# Patient Record
Sex: Female | Born: 1950 | Race: White | Hispanic: No | Smoking: Former smoker
Health system: Southern US, Community
[De-identification: ages and names within clinical notes are randomized; demographics above are authoritative.]

## PROBLEM LIST (undated history)

## (undated) DIAGNOSIS — K7460 Unspecified cirrhosis of liver: Secondary | ICD-10-CM

## (undated) DIAGNOSIS — K219 Gastro-esophageal reflux disease without esophagitis: Secondary | ICD-10-CM

## (undated) DIAGNOSIS — K7581 Nonalcoholic steatohepatitis (NASH): Secondary | ICD-10-CM

## (undated) DIAGNOSIS — R911 Solitary pulmonary nodule: Secondary | ICD-10-CM

## (undated) DIAGNOSIS — I10 Essential (primary) hypertension: Secondary | ICD-10-CM

## (undated) DIAGNOSIS — C50512 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 female breast: Secondary | ICD-10-CM

## (undated) DIAGNOSIS — C50919 Malignant neoplasm of unspecified site of unspecified female breast: Secondary | ICD-10-CM

## (undated) DIAGNOSIS — Z170 Estrogen receptor positive status [ER+]: Secondary | ICD-10-CM

## (undated) DIAGNOSIS — J479 Bronchiectasis, uncomplicated: Secondary | ICD-10-CM

## (undated) DIAGNOSIS — R011 Cardiac murmur, unspecified: Secondary | ICD-10-CM

## (undated) DIAGNOSIS — E119 Type 2 diabetes mellitus without complications: Secondary | ICD-10-CM

## (undated) HISTORY — PX: ABDOMINAL HYSTERECTOMY: SHX81

## (undated) HISTORY — PX: TONSILLECTOMY: SUR1361

## (undated) HISTORY — PX: CHOLECYSTECTOMY: SHX55

## (undated) HISTORY — PX: SPLENECTOMY: SUR1306

## (undated) HISTORY — PX: CATARACT EXTRACTION: SUR2

---

## 2005-04-03 ENCOUNTER — Ambulatory Visit: Payer: Self-pay | Admitting: Family Medicine

## 2006-01-20 ENCOUNTER — Ambulatory Visit: Payer: Self-pay | Admitting: Family Medicine

## 2006-04-13 ENCOUNTER — Ambulatory Visit: Payer: Self-pay | Admitting: Family Medicine

## 2007-04-22 ENCOUNTER — Ambulatory Visit: Payer: Self-pay | Admitting: Family Medicine

## 2007-05-21 ENCOUNTER — Ambulatory Visit: Payer: Self-pay | Admitting: Gastroenterology

## 2008-03-07 ENCOUNTER — Ambulatory Visit: Payer: Self-pay | Admitting: Gastroenterology

## 2008-04-24 ENCOUNTER — Ambulatory Visit: Payer: Self-pay | Admitting: Family Medicine

## 2008-07-14 ENCOUNTER — Ambulatory Visit: Payer: Self-pay | Admitting: Internal Medicine

## 2009-05-03 ENCOUNTER — Ambulatory Visit: Payer: Self-pay | Admitting: Family Medicine

## 2009-07-03 ENCOUNTER — Ambulatory Visit: Payer: Self-pay | Admitting: Nurse Practitioner

## 2009-07-05 ENCOUNTER — Ambulatory Visit: Payer: Self-pay | Admitting: Family Medicine

## 2010-05-07 ENCOUNTER — Ambulatory Visit: Payer: Self-pay | Admitting: Family Medicine

## 2010-07-09 DIAGNOSIS — N949 Unspecified condition associated with female genital organs and menstrual cycle: Secondary | ICD-10-CM | POA: Insufficient documentation

## 2010-07-09 HISTORY — DX: Unspecified condition associated with female genital organs and menstrual cycle: N94.9

## 2011-03-19 ENCOUNTER — Ambulatory Visit: Payer: Self-pay | Admitting: Family Medicine

## 2011-09-09 ENCOUNTER — Ambulatory Visit: Payer: Self-pay | Admitting: Family Medicine

## 2012-03-10 HISTORY — PX: CARDIAC CATHETERIZATION: SHX172

## 2012-09-29 DIAGNOSIS — K760 Fatty (change of) liver, not elsewhere classified: Secondary | ICD-10-CM | POA: Insufficient documentation

## 2012-09-29 HISTORY — DX: Fatty (change of) liver, not elsewhere classified: K76.0

## 2012-10-06 ENCOUNTER — Ambulatory Visit: Payer: Self-pay | Admitting: Nurse Practitioner

## 2013-06-08 ENCOUNTER — Encounter (HOSPITAL_COMMUNITY): Payer: Self-pay | Admitting: Emergency Medicine

## 2013-06-08 ENCOUNTER — Emergency Department (HOSPITAL_COMMUNITY): Payer: BC Managed Care – PPO

## 2013-06-08 ENCOUNTER — Observation Stay (HOSPITAL_COMMUNITY)
Admission: EM | Admit: 2013-06-08 | Discharge: 2013-06-10 | Disposition: A | Discharge status: Home / Self Care | Payer: BC Managed Care – PPO | Attending: Family Medicine | Admitting: Family Medicine

## 2013-06-08 DIAGNOSIS — J479 Bronchiectasis, uncomplicated: Secondary | ICD-10-CM | POA: Diagnosis present

## 2013-06-08 DIAGNOSIS — I359 Nonrheumatic aortic valve disorder, unspecified: Secondary | ICD-10-CM | POA: Insufficient documentation

## 2013-06-08 DIAGNOSIS — K7689 Other specified diseases of liver: Secondary | ICD-10-CM | POA: Insufficient documentation

## 2013-06-08 DIAGNOSIS — R946 Abnormal results of thyroid function studies: Secondary | ICD-10-CM | POA: Insufficient documentation

## 2013-06-08 DIAGNOSIS — J209 Acute bronchitis, unspecified: Secondary | ICD-10-CM

## 2013-06-08 DIAGNOSIS — I10 Essential (primary) hypertension: Secondary | ICD-10-CM | POA: Diagnosis present

## 2013-06-08 DIAGNOSIS — J40 Bronchitis, not specified as acute or chronic: Secondary | ICD-10-CM | POA: Diagnosis present

## 2013-06-08 DIAGNOSIS — E119 Type 2 diabetes mellitus without complications: Secondary | ICD-10-CM | POA: Insufficient documentation

## 2013-06-08 DIAGNOSIS — R0989 Other specified symptoms and signs involving the circulatory and respiratory systems: Principal | ICD-10-CM | POA: Insufficient documentation

## 2013-06-08 DIAGNOSIS — Z87891 Personal history of nicotine dependence: Secondary | ICD-10-CM | POA: Insufficient documentation

## 2013-06-08 DIAGNOSIS — Z7982 Long term (current) use of aspirin: Secondary | ICD-10-CM | POA: Insufficient documentation

## 2013-06-08 DIAGNOSIS — E860 Dehydration: Secondary | ICD-10-CM | POA: Insufficient documentation

## 2013-06-08 DIAGNOSIS — I2584 Coronary atherosclerosis due to calcified coronary lesion: Secondary | ICD-10-CM | POA: Insufficient documentation

## 2013-06-08 DIAGNOSIS — R000 Tachycardia, unspecified: Secondary | ICD-10-CM | POA: Insufficient documentation

## 2013-06-08 DIAGNOSIS — Z794 Long term (current) use of insulin: Secondary | ICD-10-CM | POA: Insufficient documentation

## 2013-06-08 DIAGNOSIS — R0600 Dyspnea, unspecified: Secondary | ICD-10-CM | POA: Diagnosis present

## 2013-06-08 DIAGNOSIS — E1140 Type 2 diabetes mellitus with diabetic neuropathy, unspecified: Secondary | ICD-10-CM | POA: Diagnosis present

## 2013-06-08 DIAGNOSIS — R0609 Other forms of dyspnea: Principal | ICD-10-CM | POA: Insufficient documentation

## 2013-06-08 HISTORY — DX: Type 2 diabetes mellitus without complications: E11.9

## 2013-06-08 HISTORY — DX: Unspecified cirrhosis of liver: K74.60

## 2013-06-08 HISTORY — DX: Solitary pulmonary nodule: R91.1

## 2013-06-08 HISTORY — DX: Bronchiectasis, uncomplicated: J47.9

## 2013-06-08 HISTORY — DX: Essential (primary) hypertension: I10

## 2013-06-08 LAB — COMPREHENSIVE METABOLIC PANEL
ALK PHOS: 150 U/L — AB (ref 39–117)
ALT: 60 U/L — ABNORMAL HIGH (ref 0–35)
AST: 53 U/L — ABNORMAL HIGH (ref 0–37)
Albumin: 4 g/dL (ref 3.5–5.2)
BUN: 13 mg/dL (ref 6–23)
CO2: 26 mEq/L (ref 19–32)
CREATININE: 0.7 mg/dL (ref 0.50–1.10)
Calcium: 9.8 mg/dL (ref 8.4–10.5)
Chloride: 102 mEq/L (ref 96–112)
GFR calc Af Amer: >90 mL/min (ref >90)
GFR calc non Af Amer: >90 mL/min (ref >90)
Glucose, Bld: 149 mg/dL — ABNORMAL HIGH (ref 70–99)
Potassium: 4 mEq/L (ref 3.7–5.3)
Sodium: 143 mEq/L (ref 137–147)
TOTAL PROTEIN: 8.4 g/dL — AB (ref 6.0–8.3)
Total Bilirubin: 0.6 mg/dL (ref 0.3–1.2)

## 2013-06-08 LAB — CBC WITH DIFFERENTIAL/PLATELET
Basophils Absolute: 0 10*3/uL (ref 0.0–0.1)
Basophils Relative: 0 % (ref 0–1)
EOS PCT: 3 % (ref 0–5)
Eosinophils Absolute: 0.3 10*3/uL (ref 0.0–0.7)
HCT: 38.5 % (ref 36.0–46.0)
HEMOGLOBIN: 12.7 g/dL (ref 12.0–15.0)
Lymphocytes Relative: 25 % (ref 12–46)
Lymphs Abs: 2.5 10*3/uL (ref 0.7–4.0)
MCH: 29.3 pg (ref 26.0–34.0)
MCHC: 33 g/dL (ref 30.0–36.0)
MCV: 88.7 fL (ref 78.0–100.0)
MONO ABS: 0.9 10*3/uL (ref 0.1–1.0)
MONOS PCT: 9 % (ref 3–12)
Neutro Abs: 6.2 10*3/uL (ref 1.7–7.7)
Neutrophils Relative %: 63 % (ref 43–77)
Platelets: 205 10*3/uL (ref 150–400)
RBC: 4.34 MIL/uL (ref 3.87–5.11)
RDW: 13.2 % (ref 11.5–15.5)
WBC: 10 10*3/uL (ref 4.0–10.5)

## 2013-06-08 LAB — PRO B NATRIURETIC PEPTIDE: PRO B NATRI PEPTIDE: 175.8 pg/mL — AB (ref 0–125)

## 2013-06-08 LAB — TROPONIN I: Troponin I: <0.3 ng/mL (ref <0.30)

## 2013-06-08 MED ORDER — IPRATROPIUM BROMIDE 0.02 % IN SOLN
0.5000 mg | Freq: Once | RESPIRATORY_TRACT | Status: AC
  Administered 2013-06-08: 0.5 mg via RESPIRATORY_TRACT
  Filled 2013-06-08: qty 2.5

## 2013-06-08 MED ORDER — METHYLPREDNISOLONE SODIUM SUCC 125 MG IJ SOLR
125.0000 mg | Freq: Once | INTRAMUSCULAR | Status: AC
  Administered 2013-06-08: 125 mg via INTRAVENOUS
  Filled 2013-06-08: qty 2

## 2013-06-08 MED ORDER — HYDROCOD POLST-CHLORPHEN POLST 10-8 MG/5ML PO LQCR: 5.0000 mL | Freq: Two times a day (BID) | ORAL | Status: DC | PRN

## 2013-06-08 MED ORDER — LEVOFLOXACIN 500 MG PO TABS: ORAL_TABLET | ORAL | Status: DC

## 2013-06-08 MED ORDER — IPRATROPIUM-ALBUTEROL 0.5-2.5 (3) MG/3ML IN SOLN
3.0000 mL | Freq: Once | RESPIRATORY_TRACT | Status: AC
  Administered 2013-06-08: 3 mL via RESPIRATORY_TRACT
  Filled 2013-06-08: qty 3

## 2013-06-08 MED ORDER — LEVOFLOXACIN 500 MG PO TABS
500.0000 mg | ORAL_TABLET | Freq: Once | ORAL | Status: AC
  Administered 2013-06-08: 500 mg via ORAL
  Filled 2013-06-08: qty 1

## 2013-06-08 MED ORDER — ALBUTEROL SULFATE (2.5 MG/3ML) 0.083% IN NEBU
2.5000 mg | INHALATION_SOLUTION | Freq: Once | RESPIRATORY_TRACT | Status: AC
  Administered 2013-06-08: 2.5 mg via RESPIRATORY_TRACT
  Filled 2013-06-08: qty 3

## 2013-06-08 MED ORDER — LORAZEPAM 2 MG/ML IJ SOLN
0.5000 mg | Freq: Once | INTRAMUSCULAR | Status: AC
  Administered 2013-06-08: 0.5 mg via INTRAVENOUS
  Filled 2013-06-08: qty 1

## 2013-06-08 MED ORDER — FUROSEMIDE 10 MG/ML IJ SOLN
20.0000 mg | Freq: Once | INTRAMUSCULAR | Status: AC
  Administered 2013-06-08: 20 mg via INTRAVENOUS
  Filled 2013-06-08: qty 2

## 2013-06-08 MED ORDER — ALBUTEROL SULFATE (2.5 MG/3ML) 0.083% IN NEBU
5.0000 mg | INHALATION_SOLUTION | Freq: Once | RESPIRATORY_TRACT | Status: AC
  Administered 2013-06-09: 5 mg via RESPIRATORY_TRACT
  Filled 2013-06-08: qty 6

## 2013-06-08 NOTE — ED Notes (Signed)
Pt c/o sob and wheezing at roughly 5 this evening.

## 2013-06-08 NOTE — Discharge Instructions (Signed)
Follow up with your md next week. °

## 2013-06-08 NOTE — ED Provider Notes (Signed)
CSN: [id number]     Arrival date & time 06/08/13  2007 History  This chart was scribed for Benny LennertJoseph L Quinta Eimer, MD by Beverly MilchJ Harrison Collins, ED Scribe. This patient was seen in room APA02/APA02 and the patient's care was started at 8:25 PM.    Chief Complaint  Patient presents with  . Shortness of Breath      Patient is a 63 y.o. female presenting with shortness of breath. The history is provided by the patient and a relative. No language interpreter was used.  Shortness of Breath Severity:  Mild Duration:  4 hours Timing:  Constant Progression:  Worsening Chronicity:  New Context: activity   Associated symptoms: cough (productive of "green stuff")   Associated symptoms: no abdominal pain, no chest pain, no headaches and no rash   Risk factors: obesity      Past Medical History  Diagnosis Date  . Bronchiectasis   . Diabetes mellitus without complication   . Lung nodule   . Non-alcoholic cirrhosis   . Hypertension     Past Surgical History  Procedure Laterality Date  . Tonsillectomy    . Cholecystectomy    . Abdominal hysterectomy    . Cesarean section    . Cardiac catheterization  2014    Negative    No family history on file. History  Substance Use Topics  . Smoking status: Former Games developermoker  . Smokeless tobacco: Not on file  . Alcohol Use: No    OB History   Grav Para Term Preterm Abortions TAB SAB Ect Mult Living                  Review of Systems  Constitutional: Negative for appetite change and fatigue.  HENT: Negative for congestion, ear discharge and sinus pressure.   Eyes: Negative for discharge.  Respiratory: Positive for cough (productive of "green stuff") and shortness of breath.   Cardiovascular: Negative for chest pain.  Gastrointestinal: Negative for abdominal pain and diarrhea.  Genitourinary: Negative for frequency and hematuria.  Musculoskeletal: Negative for back pain.  Skin: Negative for rash.  Neurological: Negative for seizures and  headaches.  Psychiatric/Behavioral: Negative for hallucinations.      Allergies  Codeine and Penicillins  Home Medications  No current outpatient prescriptions on file.  Triage Vitals: BP 159/72  Temp(Src) 99.7 F (37.6 C) (Oral)  Wt 180 lb (81.647 kg)  SpO2 98%  Physical Exam  Nursing note and vitals reviewed. Constitutional: She is oriented to person, place, and time. She appears well-developed.  HENT:  Head: Normocephalic.  Eyes: Conjunctivae and EOM are normal. No scleral icterus.  Neck: Neck supple. No thyromegaly present.  Cardiovascular: Normal rate and regular rhythm.  Exam reveals no gallop and no friction rub.   No murmur heard. Pulmonary/Chest: No stridor. She has no wheezes. She has no rales. She exhibits no tenderness.  Minor stridor  Abdominal: She exhibits no distension. There is no tenderness. There is no rebound.  Musculoskeletal: Normal range of motion. She exhibits no edema.  Lymphadenopathy:    She has no cervical adenopathy.  Neurological: She is oriented to person, place, and time. She exhibits normal muscle tone. Coordination normal.  Skin: No rash noted. No erythema.  Psychiatric: She has a normal mood and affect. Her behavior is normal.    ED Course  Procedures (including critical care time)  DIAGNOSTIC STUDIES: Oxygen Saturation is 98% on [place], normal by my interpretation.    COORDINATION OF CARE: 8:29 PM- Pt's parents advised  of plan for treatment. Parents verbalize understanding and agreement with plan.     Labs Review Labs Reviewed  COMPREHENSIVE METABOLIC PANEL - Abnormal; Notable for the following:    Glucose, Bld 149 (*)    Total Protein 8.4 (*)    AST 53 (*)    ALT 60 (*)    Alkaline Phosphatase 150 (*)    All other components within normal limits  CBC WITH DIFFERENTIAL  TROPONIN I  PRO B NATRIURETIC PEPTIDE   Imaging Review Dg Chest Portable 1 View  06/08/2013   CLINICAL DATA:  Shortness of breath.  Wheezing.  EXAM:  PORTABLE CHEST - 1 VIEW  COMPARISON:  None.  FINDINGS: 2030 hrs. Vascular congestion with probable interstitial pulmonary edema. No dense focal airspace consolidation or pleural effusion. Cardiopericardial silhouette is at upper limits of normal for size. Imaged bony structures of the thorax are intact. Telemetry leads overlie the chest.  IMPRESSION: Vascular congestion with probable interstitial edema.   Electronically Signed   By: Kennith Center M.D.   On: 06/08/2013 20:41     EKG Interpretation None      MDM   Final diagnoses:  None    The chart was scribed for me under my direct supervision.  I personally performed the history, physical, and medical decision making and all procedures in the evaluation of this patient.Benny Lennert, MD 06/15/13 [phone]

## 2013-06-09 ENCOUNTER — Encounter (HOSPITAL_COMMUNITY): Payer: Self-pay | Admitting: Internal Medicine

## 2013-06-09 DIAGNOSIS — R0989 Other specified symptoms and signs involving the circulatory and respiratory systems: Principal | ICD-10-CM

## 2013-06-09 DIAGNOSIS — J209 Acute bronchitis, unspecified: Secondary | ICD-10-CM

## 2013-06-09 DIAGNOSIS — E1140 Type 2 diabetes mellitus with diabetic neuropathy, unspecified: Secondary | ICD-10-CM | POA: Diagnosis present

## 2013-06-09 DIAGNOSIS — J479 Bronchiectasis, uncomplicated: Secondary | ICD-10-CM | POA: Diagnosis present

## 2013-06-09 DIAGNOSIS — R0609 Other forms of dyspnea: Secondary | ICD-10-CM

## 2013-06-09 DIAGNOSIS — J40 Bronchitis, not specified as acute or chronic: Secondary | ICD-10-CM | POA: Diagnosis present

## 2013-06-09 DIAGNOSIS — I10 Essential (primary) hypertension: Secondary | ICD-10-CM

## 2013-06-09 DIAGNOSIS — R0600 Dyspnea, unspecified: Secondary | ICD-10-CM | POA: Diagnosis present

## 2013-06-09 DIAGNOSIS — I359 Nonrheumatic aortic valve disorder, unspecified: Secondary | ICD-10-CM

## 2013-06-09 DIAGNOSIS — E119 Type 2 diabetes mellitus without complications: Secondary | ICD-10-CM | POA: Diagnosis present

## 2013-06-09 HISTORY — DX: Dyspnea, unspecified: R06.00

## 2013-06-09 LAB — TROPONIN I

## 2013-06-09 LAB — GLUCOSE, CAPILLARY
GLUCOSE-CAPILLARY: 376 mg/dL — AB (ref 70–99)
GLUCOSE-CAPILLARY: 337 mg/dL — AB (ref 70–99)
GLUCOSE-CAPILLARY: 285 mg/dL — AB (ref 70–99)
Glucose-Capillary: 294 mg/dL — ABNORMAL HIGH (ref 70–99)
Glucose-Capillary: 370 mg/dL — ABNORMAL HIGH (ref 70–99)

## 2013-06-09 LAB — INFLUENZA PANEL BY PCR (TYPE A & B)
H1N1FLUPCR: NOT DETECTED
INFLAPCR: NEGATIVE
Influenza B By PCR: NEGATIVE

## 2013-06-09 LAB — TSH: TSH: 6.06 u[IU]/mL — ABNORMAL HIGH (ref 0.350–4.500)

## 2013-06-09 MED ORDER — ONDANSETRON HCL 4 MG PO TABS: 4.0000 mg | ORAL_TABLET | Freq: Four times a day (QID) | ORAL | Status: DC | PRN

## 2013-06-09 MED ORDER — ACETAMINOPHEN 650 MG RE SUPP: 650.0000 mg | Freq: Four times a day (QID) | RECTAL | Status: DC | PRN

## 2013-06-09 MED ORDER — TRAZODONE HCL 50 MG PO TABS
50.0000 mg | ORAL_TABLET | Freq: Every evening | ORAL | Status: DC | PRN
  Administered 2013-06-09: 50 mg via ORAL
  Filled 2013-06-09: qty 1

## 2013-06-09 MED ORDER — INSULIN GLARGINE 100 UNIT/ML ~~LOC~~ SOLN
12.0000 [IU] | Freq: Every day | SUBCUTANEOUS | Status: DC
  Administered 2013-06-09: 12 [IU] via SUBCUTANEOUS
  Filled 2013-06-09 (×4): qty 0.12

## 2013-06-09 MED ORDER — ONDANSETRON HCL 4 MG/2ML IJ SOLN: 4.0000 mg | Freq: Four times a day (QID) | INTRAMUSCULAR | Status: DC | PRN

## 2013-06-09 MED ORDER — INSULIN ASPART 100 UNIT/ML ~~LOC~~ SOLN
0.0000 [IU] | Freq: Every day | SUBCUTANEOUS | Status: DC
  Administered 2013-06-09: 5 [IU] via SUBCUTANEOUS

## 2013-06-09 MED ORDER — INSULIN ASPART 100 UNIT/ML ~~LOC~~ SOLN
0.0000 [IU] | Freq: Three times a day (TID) | SUBCUTANEOUS | Status: DC
  Administered 2013-06-09: 8 [IU] via SUBCUTANEOUS
  Administered 2013-06-09: 11 [IU] via SUBCUTANEOUS
  Administered 2013-06-09: 8 [IU] via SUBCUTANEOUS
  Administered 2013-06-10: 5 [IU] via SUBCUTANEOUS
  Administered 2013-06-10: 3 [IU] via SUBCUTANEOUS

## 2013-06-09 MED ORDER — SODIUM CHLORIDE 0.9 % IJ SOLN
3.0000 mL | Freq: Two times a day (BID) | INTRAMUSCULAR | Status: DC
  Administered 2013-06-09 – 2013-06-10 (×3): 3 mL via INTRAVENOUS

## 2013-06-09 MED ORDER — IBUPROFEN 800 MG PO TABS
800.0000 mg | ORAL_TABLET | Freq: Once | ORAL | Status: AC
  Administered 2013-06-09: 800 mg via ORAL

## 2013-06-09 MED ORDER — ALBUTEROL SULFATE (2.5 MG/3ML) 0.083% IN NEBU
2.5000 mg | INHALATION_SOLUTION | Freq: Three times a day (TID) | RESPIRATORY_TRACT | Status: DC
  Administered 2013-06-09 – 2013-06-10 (×5): 2.5 mg via RESPIRATORY_TRACT
  Filled 2013-06-09 (×5): qty 3

## 2013-06-09 MED ORDER — GUAIFENESIN-DM 100-10 MG/5ML PO SYRP
5.0000 mL | ORAL_SOLUTION | ORAL | Status: DC | PRN
  Filled 2013-06-09: qty 5

## 2013-06-09 MED ORDER — SODIUM CHLORIDE 0.9 % IV SOLN
INTRAVENOUS | Status: DC
  Administered 2013-06-09: 1000 mL via INTRAVENOUS

## 2013-06-09 MED ORDER — OSELTAMIVIR PHOSPHATE 75 MG PO CAPS
75.0000 mg | ORAL_CAPSULE | Freq: Two times a day (BID) | ORAL | Status: DC
  Administered 2013-06-09 – 2013-06-10 (×3): 75 mg via ORAL
  Filled 2013-06-09 (×3): qty 1

## 2013-06-09 MED ORDER — CIPROFLOXACIN HCL 250 MG PO TABS
500.0000 mg | ORAL_TABLET | Freq: Two times a day (BID) | ORAL | Status: DC
  Administered 2013-06-09 – 2013-06-10 (×2): 500 mg via ORAL
  Filled 2013-06-09 (×2): qty 2

## 2013-06-09 MED ORDER — PANTOPRAZOLE SODIUM 40 MG PO TBEC
40.0000 mg | DELAYED_RELEASE_TABLET | Freq: Every day | ORAL | Status: DC
  Administered 2013-06-09 – 2013-06-10 (×2): 40 mg via ORAL
  Filled 2013-06-09 (×2): qty 1

## 2013-06-09 MED ORDER — IBUPROFEN 800 MG PO TABS
ORAL_TABLET | ORAL | Status: AC
  Filled 2013-06-09: qty 1

## 2013-06-09 MED ORDER — ALBUTEROL SULFATE (2.5 MG/3ML) 0.083% IN NEBU: 2.5000 mg | INHALATION_SOLUTION | RESPIRATORY_TRACT | Status: DC | PRN

## 2013-06-09 MED ORDER — IOHEXOL 300 MG/ML  SOLN
80.0000 mL | Freq: Once | INTRAMUSCULAR | Status: AC | PRN
  Administered 2013-06-09: 80 mL via INTRAVENOUS

## 2013-06-09 MED ORDER — PREDNISONE 20 MG PO TABS
40.0000 mg | ORAL_TABLET | Freq: Every day | ORAL | Status: DC
  Administered 2013-06-10: 40 mg via ORAL
  Filled 2013-06-09: qty 4

## 2013-06-09 MED ORDER — ENOXAPARIN SODIUM 40 MG/0.4ML ~~LOC~~ SOLN
40.0000 mg | SUBCUTANEOUS | Status: DC
  Administered 2013-06-09 – 2013-06-10 (×2): 40 mg via SUBCUTANEOUS
  Filled 2013-06-09 (×2): qty 0.4

## 2013-06-09 MED ORDER — FLUTICASONE PROPIONATE 50 MCG/ACT NA SUSP
1.0000 | Freq: Every day | NASAL | Status: DC
  Administered 2013-06-09 – 2013-06-10 (×2): 1 via NASAL
  Filled 2013-06-09: qty 16

## 2013-06-09 MED ORDER — ACETAMINOPHEN 325 MG PO TABS
650.0000 mg | ORAL_TABLET | Freq: Four times a day (QID) | ORAL | Status: DC | PRN
  Administered 2013-06-09 – 2013-06-10 (×3): 650 mg via ORAL
  Filled 2013-06-09 (×3): qty 2

## 2013-06-09 MED ORDER — GUAIFENESIN ER 600 MG PO TB12
600.0000 mg | ORAL_TABLET | Freq: Two times a day (BID) | ORAL | Status: DC
  Administered 2013-06-09 – 2013-06-10 (×3): 600 mg via ORAL
  Filled 2013-06-09 (×3): qty 1

## 2013-06-09 MED ORDER — LEVOFLOXACIN IN D5W 500 MG/100ML IV SOLN: 500.0000 mg | INTRAVENOUS | Status: DC

## 2013-06-09 MED ORDER — DOCUSATE SODIUM 100 MG PO CAPS
100.0000 mg | ORAL_CAPSULE | Freq: Two times a day (BID) | ORAL | Status: DC
  Administered 2013-06-09 – 2013-06-10 (×3): 100 mg via ORAL
  Filled 2013-06-09 (×3): qty 1

## 2013-06-09 MED ORDER — ASPIRIN EC 81 MG PO TBEC
81.0000 mg | DELAYED_RELEASE_TABLET | Freq: Every day | ORAL | Status: DC
  Administered 2013-06-09 – 2013-06-10 (×2): 81 mg via ORAL
  Filled 2013-06-09 (×2): qty 1

## 2013-06-09 MED ORDER — PREDNISONE 20 MG PO TABS
60.0000 mg | ORAL_TABLET | Freq: Two times a day (BID) | ORAL | Status: DC
  Administered 2013-06-09: 60 mg via ORAL
  Filled 2013-06-09: qty 3

## 2013-06-09 NOTE — H&P (Signed)
[facility] History and Physical  Bianca Rogers:[id number] DOB: 1950/05/14 DOA: 06/08/2013   PCP: Aretta Nip, [place]  Specialists: Patient is followed by pulmonology for bronchiectasis and by gastroenterology for fatty liver disease at [facility]  Chief Complaint: Shortness of breath since the 5 PM  HPI: Bianca Rogers is a 63 y.o. female with a past medical history bronchiectasis, fatty liver disease, diabetes mellitus type II on insulin, hypertension, who was in her usual state of health till about 5 PM yesterday evening when she started having shortness of breath with wheezing. She's had difficulty with breathing in the past due to her bronchiectasis, but never quite as severe. Over the last day or so she has had noticed some sinus drainage and took allergy pill yesterday morning without much relief. She had a temperature of 100.78F yesterday. Her daughters have been sick with respiratory issues. She's had nausea, but no vomiting. She's had a cough with greenish expectoration. Denies any blood in the sputum. She's had some chest heaviness mainly with exertion. She said she's had a cardiac workup at [facility] in 2012 including a catheterization, which was unremarkable. Since her symptoms were getting worse she decided to come in to the hospital. In the emergency department patient was given antibiotics, steroids and nebulizers. She underwent a CT chest. When she was ambulated prior to discharge she got quite dyspneic and, so we were consulted for admission.  Home Medications: Prior to Admission medications   Medication Sig Start Date End Date Taking? Authorizing Provider  albuterol (PROVENTIL HFA) 108 (90 BASE) MCG/ACT inhaler Inhale 1 puff into the lungs every 6 (six) hours as needed for wheezing or shortness of breath.   Yes Historical Provider, MD  aspirin EC 81 MG tablet Take 81 mg by mouth daily.   Yes Historical Provider, MD  Diphenhydramine-APAP 12.5-500 MG TABS Take  1-2 tablets by mouth daily as needed (for allergy and cold).   Yes Historical Provider, MD  insulin glargine (LANTUS) 100 UNIT/ML injection Inject 12 Units into the skin at bedtime.   Yes Historical Provider, MD  lisinopril-hydrochlorothiazide (PRINZIDE,ZESTORETIC) 10-12.5 MG per tablet Take 1 tablet by mouth daily.   Yes Historical Provider, MD  loratadine (CLARITIN) 10 MG tablet Take 10 mg by mouth once as needed for allergies.   Yes Historical Provider, MD  metFORMIN (GLUCOPHAGE) 500 MG tablet Take 1,000 mg by mouth 2 (two) times daily.   Yes Historical Provider, MD  nitroGLYCERIN (NITROSTAT) 0.4 MG SL tablet Place 0.4 mg under the tongue every 5 (five) minutes as needed for chest pain.   Yes Historical Provider, MD  omeprazole (PRILOSEC) 20 MG capsule Take 20 mg by mouth daily.   Yes Historical Provider, MD  chlorpheniramine-HYDROcodone (TUSSIONEX PENNKINETIC ER) 10-8 MG/5ML LQCR Take 5 mLs by mouth every 12 (twelve) hours as needed for cough. 06/08/13   Maudry Diego, MD  levofloxacin (LEVAQUIN) 500 MG tablet Take one tablet a day 06/08/13   Maudry Diego, MD    Allergies:  Allergies  Allergen Reactions  . Penicillins Rash  . Codeine Other (See Comments)    Irritable, insomnia  . Zoloft [Sertraline Hcl] Other (See Comments)    Paradoxical response    Past Medical History: Past Medical History  Diagnosis Date  . Bronchiectasis   . Diabetes mellitus without complication   . Lung nodule   . Non-alcoholic cirrhosis   . Hypertension     Past Surgical History  Procedure Laterality Date  . Tonsillectomy    .  Cholecystectomy    . Abdominal hysterectomy    . Cesarean section    . Cardiac catheterization  2014    Negative    Social History: Patient lives in [place] with her husband. She also has a daughter, who lives with her. Quit smoking 34 years ago. Has approximately 12-18-pack-year history of smoking. Doesn't consume any alcohol currently. Denies any illicit drug use.  Usually independent with daily activities.  Family History:  Family History  Problem Relation Age of Onset  . Lung cancer Father      Review of Systems - History obtained from the patient General ROS: positive for  - fatigue Psychological ROS: negative Ophthalmic ROS: negative ENT ROS: negative Allergy and Immunology ROS: negative Hematological and Lymphatic ROS: negative Endocrine ROS: negative Respiratory ROS: as in hpi Cardiovascular ROS: as in hpi Gastrointestinal ROS: no abdominal pain, change in bowel habits, or black or bloody stools Genito-Urinary ROS: no dysuria, trouble voiding, or hematuria Musculoskeletal ROS: negative Neurological ROS: no TIA or stroke symptoms Dermatological ROS: negative  Physical Examination  Filed Vitals:   06/09/13 0032 06/09/13 0130 06/09/13 0335 06/09/13 0343  BP: 134/51  114/52   Pulse: 113 107 196   Temp:      TempSrc:      Resp: '20 17 22   ' Height:    '5\' 3"'  (1.6 m)  Weight:    81.5 kg (179 lb 10.8 oz)  SpO2: 99% 98% 96%     BP 114/52  Pulse 196  Temp(Src) 99.7 F (37.6 C) (Oral)  Resp 22  Ht '5\' 3"'  (1.6 m)  Wt 81.5 kg (179 lb 10.8 oz)  BMI 31.84 kg/m2  SpO2 96%  General appearance: alert, cooperative, appears stated age and no distress Head: Normocephalic, without obvious abnormality, atraumatic. Left frontal sinus tenderness was present Eyes: conjunctivae/corneas clear. PERRL, EOM's intact.  Throat: slightly dry mm Neck: no adenopathy, no carotid bruit, no JVD, supple, symmetrical, trachea midline and thyroid not enlarged, symmetric, no tenderness/mass/nodules Resp: Decreased air entry at the bases with coarse breath sounds. But no definite crackles or wheezing Cardio: S1-S2 is normal. Regular. No S3, S4. Systolic murmur appreciated throughout the precordium. No rubs, or bruit. No pedal edema. No JVD GI: Abdomen is soft. There was tenderness in the right upper quadrant, which is chronic for her due to her fatty liver  disease. This is according to the patient. No rebound, rigidity, or guarding. No masses. Bowel sounds are present. Extremities: extremities normal, atraumatic, no cyanosis or edema Pulses: 2+ and symmetric Skin: Skin color, texture, turgor normal. No rashes or lesions Lymph nodes: Cervical, supraclavicular, and axillary nodes normal. Neurologic: Alert and oriented x3. No focal neurological deficits are present  Laboratory Data: Results for orders placed during the hospital encounter of 06/08/13 (from the past 48 hour(s))  CBC WITH DIFFERENTIAL     Status: None   Collection Time    06/08/13  8:20 PM      Result Value Ref Range   WBC 10.0  4.0 - 10.5 K/uL   RBC 4.34  3.87 - 5.11 MIL/uL   Hemoglobin 12.7  12.0 - 15.0 g/dL   HCT 38.5  36.0 - 46.0 %   MCV 88.7  78.0 - 100.0 fL   MCH 29.3  26.0 - 34.0 pg   MCHC 33.0  30.0 - 36.0 g/dL   RDW 13.2  11.5 - 15.5 %   Platelets 205  150 - 400 K/uL   Neutrophils Relative % 63  43 - 77 %   Neutro Abs 6.2  1.7 - 7.7 K/uL   Lymphocytes Relative 25  12 - 46 %   Lymphs Abs 2.5  0.7 - 4.0 K/uL   Monocytes Relative 9  3 - 12 %   Monocytes Absolute 0.9  0.1 - 1.0 K/uL   Eosinophils Relative 3  0 - 5 %   Eosinophils Absolute 0.3  0.0 - 0.7 K/uL   Basophils Relative 0  0 - 1 %   Basophils Absolute 0.0  0.0 - 0.1 K/uL  TROPONIN I     Status: None   Collection Time    06/08/13  8:20 PM      Result Value Ref Range   Troponin I <0.30  <0.30 ng/mL   Comment:            Due to the release kinetics of cTnI,     a negative result within the first hours     of the onset of symptoms does not rule out     myocardial infarction with certainty.     If myocardial infarction is still suspected,     repeat the test at appropriate intervals.  COMPREHENSIVE METABOLIC PANEL     Status: Abnormal   Collection Time    06/08/13  8:20 PM      Result Value Ref Range   Sodium 143  137 - 147 mEq/L   Potassium 4.0  3.7 - 5.3 mEq/L   Chloride 102  96 - 112 mEq/L   CO2  26  19 - 32 mEq/L   Glucose, Bld 149 (*) 70 - 99 mg/dL   BUN 13  6 - 23 mg/dL   Creatinine, Ser 0.70  0.50 - 1.10 mg/dL   Calcium 9.8  8.4 - 10.5 mg/dL   Total Protein 8.4 (*) 6.0 - 8.3 g/dL   Albumin 4.0  3.5 - 5.2 g/dL   AST 53 (*) 0 - 37 U/L   ALT 60 (*) 0 - 35 U/L   Alkaline Phosphatase 150 (*) 39 - 117 U/L   Total Bilirubin 0.6  0.3 - 1.2 mg/dL   GFR calc non Af Amer >90  >90 mL/min   GFR calc Af Amer >90  >90 mL/min   Comment: (NOTE)     The eGFR has been calculated using the CKD EPI equation.     This calculation has not been validated in all clinical situations.     eGFR's persistently <90 mL/min signify possible Chronic Kidney     Disease.  PRO B NATRIURETIC PEPTIDE     Status: Abnormal   Collection Time    06/08/13  8:47 PM      Result Value Ref Range   Pro B Natriuretic peptide (BNP) 175.8 (*) 0 - 125 pg/mL    Radiology Reports: Ct Chest W Contrast  06/09/2013   CLINICAL DATA:  Difficulty breathing.  EXAM: CT CHEST WITH CONTRAST  TECHNIQUE: Multidetector CT imaging of the chest was performed during intravenous contrast administration.  CONTRAST:  11m OMNIPAQUE IOHEXOL 300 MG/ML  SOLN  COMPARISON:  Chest x-ray obtained 06/08/2013  FINDINGS: Mediastinum: Unremarkable CT appearance of the thyroid gland. No suspicious mediastinal or hilar adenopathy. No soft tissue mediastinal mass. The thoracic esophagus is unremarkable.  Heart/Vascular: Conventional 3 vessel aortic arch. No aneurysmal dilatation or dissection. Mild atherosclerotic calcifications without a stenosis. Main pulmonary artery within normal limits for size. No large central pulmonary embolus. Atherosclerotic calcifications noted in the coronary arteries. The  heart is within normal limits for size. No pericardial effusion.  Lungs/Pleura: The lungs are clear.  No pleural effusion.  Bones/Soft Tissues: No acute fracture or aggressive appearing lytic or blastic osseous lesion.  Upper Abdomen: Surgical changes of prior  cholecystectomy. Otherwise, the imaged upper abdomen is unremarkable.  IMPRESSION: 1. No acute cardiopulmonary process. 2. Coronary artery calcifications. Please note that although the presence of coronary artery calcium documents the presence of coronary artery disease, the severity of this disease and any potential stenosis cannot be assessed on this non-gated CT examination. Assessment for potential risk factor modification, dietary therapy or pharmacologic therapy may be warranted, if clinically indicated.   Electronically Signed   By: Jacqulynn Cadet M.D.   On: 06/09/2013 01:38   Dg Chest Portable 1 View  06/08/2013   CLINICAL DATA:  Shortness of breath.  Wheezing.  EXAM: PORTABLE CHEST - 1 VIEW  COMPARISON:  None.  FINDINGS: 2030 hrs. Vascular congestion with probable interstitial pulmonary edema. No dense focal airspace consolidation or pleural effusion. Cardiopericardial silhouette is at upper limits of normal for size. Imaged bony structures of the thorax are intact. Telemetry leads overlie the chest.  IMPRESSION: Vascular congestion with probable interstitial edema.   Electronically Signed   By: Misty Stanley M.D.   On: 06/08/2013 20:41    Electrocardiogram: Sinus tachycardia at 107 beats per minute. Left axis deviation is noted. Intervals are normal. No Q waves. No concerning ST or T-wave changes are noted. [place] baseline is seen. So, this is not a good quality EKG.  Problem List  Principal Problem:   Dyspnea Active Problems:   Bronchitis   Bronchiectasis   DM type 2 (diabetes mellitus, type 2)   HTN (hypertension), benign   Assessment: This is a 63 year old, Caucasian female, who presents with shortness of breath, ongoing since yesterday. CT chest was unremarkable as was chest x-ray. She was wheezing earlier tonight but doesn't have any wheezes currently. She does have systolic murmur on physical examination. She does have left frontal sinus tenderness. This could all be upper  respiratory infection or bronchitis. Since she did have low-grade fever, viral syndrome is a possibility  Plan: #1 dyspnea most likely upper respiratory infection versus viral syndrome: She will be admitted to the hospital and initiated on antibiotics. She'll maintain on steroids. Breathing treatments will be given. Influenza PCR will be ordered. We will give her Flonase nasal spray as well. Since she does have significant systolic murmur, cardiac etiology needs to be ruled out. I am unable to pull information from [facility] at this time. We will go ahead and order an echocardiogram while she is hospitalized here.  #2 mild dehydration: She'll be gently hydrated.  #3 diabetes mellitus, type II: Continue with her long-acting insulin and initiate sliding scale coverage. HbA1c will be checked.  #4 history of hypertension: Continue with her antihypertensive regimen.  #5 history of bronchiectasis and pulmonary nodule: These were not reported on the CT scan done here. She'll followup with her pulmonologist at [facility].  #6 history of fatty liver disease: LFTs are minimally elevated. This will be repeated in the morning. But she can have further followup with her hepatologist at [facility] as OP.   DVT Prophylaxis: Lovenox Code Status: Full code Family Communication: Discussed with the patient and her daughters  Disposition Plan: Admit to telemetry   Further management decisions will depend on results of further testing and patient's response to treatment.   [facility]  [facility] Pager  [phone]  If 7PM-7AM, please contact night-coverage [email or web address] Password [facility]  06/09/2013, 3:46 AM

## 2013-06-09 NOTE — ED Notes (Signed)
Family at bedside. Patient placed on cardiac monitor at this time. 

## 2013-06-09 NOTE — ED Notes (Signed)
Patient brought back to er for re-evaluation. She stated she was not started to worse and dizzy. Place patient back on bed 2 and notified Dr. Fonnie JarvisBednar.

## 2013-06-09 NOTE — ED Provider Notes (Signed)
En route to discharge patient was unable to walk secondary to dyspnea, chest pressure.  On my evaluation the patient was dyspneic, tachypneic, tachycardic.  Patient required additional albuterol therapy, and on several additional attempts to ambulate, was weak, dyspneic.  CT scan was performed, and was not remarkable for focal lesions.  However, in spite of medical therapy here, the patient remained dyspneic, tachypneic, tachycardic.  She was admitted for further evaluation and management.  Gerhard Munchobert Ramzey Petrovic, MD 06/09/13 [phone]

## 2013-06-09 NOTE — Progress Notes (Signed)
UR completed 

## 2013-06-09 NOTE — Progress Notes (Signed)
*  PRELIMINARY RESULTS* Echocardiogram 2D Echocardiogram has been performed.  Bianca Rogers 06/09/2013, 3:16 PM

## 2013-06-09 NOTE — Progress Notes (Signed)
PROGRESS NOTE  Bianca Rogers:[id number]:[id number] DOB: 1950-03-14 DOA: 06/08/2013 PCP: Lemar LoftySCHMUCKER,MONICA, FNP Pulmonology for bronchiectasis and by [facility]  Summary: 63 year old woman [facility] presented with shortness of breath and wheezing. She was tachypneic, dyspneic and tachycardic and so referred for admission.  Assessment/Plan: 1. Suspected acute bronchitis superimposed on chronic bronchiectasis. PCR for influenza negative. CT chest negative for PE, acute pulmonary pathology. 2. Systolic murmur. Followup echocardiogram.  3. Mild dehydration. Appears resolved. 4. Bronchiectasis. Stable. Chronic dyspnea on exertion limits her daily activities. 5. Fatty liver disease. Mild transaminitis presumably secondary to fatty liver. 6. Diabetes mellitus type 2 on insulin. Fair control. Hold metformin.  7. EKG with nonspecific changes. No chest pain. Troponin is negative. Patient reports dyspnea at baseline. Follow clinically. No signs or symptoms to suggest ACS.   Continue empiric antibiotics, steroids, nebulizer treatments.  Empiric Tamiflu.  Possibly home next one to 2 days.  Pending studies:   TSH  Code Status: full code DVT prophylaxis: Lovenox Family Communication: none present Disposition Plan: home  Brendia Sacksaniel Goodrich, MD  [facility]  Pager [phone] If 7PM-7AM, please contact night-coverage at [email or web address], password [email or web address] 06/09/2013, 8:55 AM  LOS: 1 day   Consultants:    Procedures:    Antibiotics:  Levaquin 4/1  Cipro 4/2 >>   HPI/Subjective: Feels better today. Breathing better. No chest pain. Only complaint is headache (chronic).  Objective: Filed Vitals:   06/09/13 0335 06/09/13 0343 06/09/13 0406 06/09/13 0733  BP: 114/52  114/48   Pulse: 196  93   Temp:   97.8 F (36.6 C)   TempSrc:   Oral   Resp: 22  20   Height:  5\' 3"  (1.6 m)    Weight:  81.5 kg (179 lb  10.8 oz)    SpO2: 96%  99% 94%    Intake/Output Summary (Last 24 hours) at 06/09/13 0855 Last data filed at 06/09/13 0600  Gross per 24 hour  Intake 128.75 ml  Output      0 ml  Net 128.75 ml     Filed Weights   06/08/13 2015 06/09/13 0343  Weight: 81.647 kg (180 lb) 81.5 kg (179 lb 10.8 oz)    Exam:   Afebrile, vital signs are stable. No hypoxia.  Gen. Appears calm and comfortable. Speech fluent and clear.  Cardiovascular regular rate and rhythm. No rub or gallop.  Respiratory clear to auscultation bilaterally. No wheezes, rales or rhonchi. Normal respiratory effort.  Psychiatric grossly normal mood and affect. Speech fluent and appropriate.  Data Reviewed:  Capillary blood sugars 200-300s   Troponins negative.    influenza PCR negative.   CT of chest negative for PE.  EKG sinus rhythm, LVH, consider repolarization abnormality. Nonspecific ST changes.   Scheduled Meds: . albuterol  2.5 mg Nebulization TID  . aspirin EC  81 mg Oral Daily  . docusate sodium  100 mg Oral BID  . enoxaparin (LOVENOX) injection  40 mg Subcutaneous Q24H  . fluticasone  1 spray Each Nare Daily  . guaiFENesin  600 mg Oral BID  . insulin aspart  0-15 Units Subcutaneous TID WC  . insulin aspart  0-5 Units Subcutaneous QHS  . insulin glargine  12 Units Subcutaneous QHS  . levofloxacin (LEVAQUIN) IV  500 mg Intravenous Q24H  . pantoprazole  40 mg Oral Daily  . predniSONE  60 mg Oral BID WC  . sodium chloride  3 mL Intravenous  Q12H   Continuous Infusions: . sodium chloride 1,000 mL (06/09/13 0417)    Principal Problem:   Acute bronchitis Active Problems:   Dyspnea   Bronchitis   Bronchiectasis   DM type 2 (diabetes mellitus, type 2)   HTN (hypertension), benign   Time spent 20 minutes

## 2013-06-09 NOTE — Progress Notes (Signed)
Inpatient Diabetes Program Recommendations  AACE/ADA: New Consensus Statement on Inpatient Glycemic Control (2013)  Target Ranges:  Prepandial:   less than 140 mg/dL      Peak postprandial:   less than 180 mg/dL (1-2 hours)      Critically ill patients:  140 - 180 mg/dL    Results for Welford RocheHOLMES, Bianca S (MRN [id number]) as of 06/09/2013 08:13  Ref. Range 06/09/2013 04:01 06/09/2013 07:28  Glucose-Capillary Latest Range: 70-99 mg/dL 782376 (H) 956294 (H)   Diabetes history: DM2 Outpatient Diabetes medications: Lantus 12 units QHS, Metformin 1000 mg BID Current orders for Inpatient glycemic control: Lantus 12 units QHS, Novolog 0-15 units AC, Novolog 0-5 units HS  Inpatient Diabetes Program Recommendations Insulin - Basal: May want to consider changing Lantus 12 units QHS to daily so patient will get this morning since she did not receive any basal inslulin on 06/08/13. HgbA1C: A1C ordered and is pending.  Thanks, Bianca PennerMarie Ryson Bacha, RN, MSN, CCRN Diabetes Coordinator Inpatient Diabetes Program [phone] (Team Pager) [phone] (AP office) [phone] [facility] office)

## 2013-06-09 NOTE — Care Management Note (Signed)
    Page 1 of 1   06/09/2013     2:00:30 PM   CARE MANAGEMENT NOTE 06/09/2013  Patient:  Bianca Rogers,Bianca Rogers   Account Number:  [id number]  Date Initiated:  06/09/2013  Documentation initiated by:  Rosemary HolmsOBSON,Makiah Foye  Subjective/Objective Assessment:   Pt admitted from home where she lives with her spouse and a daughter. No HH/DME needs identified.     Action/Plan:   Anticipated DC Date:  06/10/2013   Anticipated DC Plan:  HOME/SELF CARE      DC Planning Services  CM consult      Choice offered to / List presented to:             Status of service:  Completed, signed off Medicare Important Message given?   (If response is "NO", the following Medicare IM given date fields will be blank) Date Medicare IM given:   Date Additional Medicare IM given:    Discharge Disposition:    Per UR Regulation:    If discussed at Long Length of Stay Meetings, dates discussed:    Comments:  06/09/13 Rosemary HolmsAmy Diron Haddon RN BSN CM

## 2013-06-10 LAB — HEMOGLOBIN A1C
Hgb A1c MFr Bld: 8.1 % — ABNORMAL HIGH (ref <5.7)
Mean Plasma Glucose: 186 mg/dL — ABNORMAL HIGH (ref <117)

## 2013-06-10 LAB — COMPREHENSIVE METABOLIC PANEL
ALBUMIN: 3 g/dL — AB (ref 3.5–5.2)
ALK PHOS: 108 U/L (ref 39–117)
ALT: 47 U/L — ABNORMAL HIGH (ref 0–35)
AST: 32 U/L (ref 0–37)
BILIRUBIN TOTAL: 0.3 mg/dL (ref 0.3–1.2)
BUN: 20 mg/dL (ref 6–23)
CHLORIDE: 106 meq/L (ref 96–112)
CO2: 27 meq/L (ref 19–32)
Calcium: 9.2 mg/dL (ref 8.4–10.5)
Creatinine, Ser: 0.68 mg/dL (ref 0.50–1.10)
GFR calc Af Amer: >90 mL/min (ref >90)
Glucose, Bld: 186 mg/dL — ABNORMAL HIGH (ref 70–99)
POTASSIUM: 4 meq/L (ref 3.7–5.3)
Sodium: 143 mEq/L (ref 137–147)
Total Protein: 6.6 g/dL (ref 6.0–8.3)

## 2013-06-10 LAB — CBC
HCT: 33.2 % — ABNORMAL LOW (ref 36.0–46.0)
Hemoglobin: 10.8 g/dL — ABNORMAL LOW (ref 12.0–15.0)
MCH: 29.1 pg (ref 26.0–34.0)
MCHC: 32.5 g/dL (ref 30.0–36.0)
MCV: 89.5 fL (ref 78.0–100.0)
PLATELETS: 167 10*3/uL (ref 150–400)
RBC: 3.71 MIL/uL — ABNORMAL LOW (ref 3.87–5.11)
RDW: 13.5 % (ref 11.5–15.5)
WBC: 8.4 10*3/uL (ref 4.0–10.5)

## 2013-06-10 LAB — GLUCOSE, CAPILLARY
GLUCOSE-CAPILLARY: 232 mg/dL — AB (ref 70–99)
Glucose-Capillary: 165 mg/dL — ABNORMAL HIGH (ref 70–99)

## 2013-06-10 MED ORDER — CIPROFLOXACIN HCL 500 MG PO TABS: 500.0000 mg | ORAL_TABLET | Freq: Two times a day (BID) | ORAL | Status: DC

## 2013-06-10 MED ORDER — OSELTAMIVIR PHOSPHATE 75 MG PO CAPS: 75.0000 mg | ORAL_CAPSULE | Freq: Two times a day (BID) | ORAL | Status: DC

## 2013-06-10 MED ORDER — PREDNISONE 10 MG PO TABS: ORAL_TABLET | ORAL | Status: DC

## 2013-06-10 NOTE — Progress Notes (Signed)
PROGRESS NOTE  Bianca HENDERSON ZOX:[id number] DOB: 06/29/1950 DOA: 06/08/2013 PCP: Lemar Lofty, FNP Pulmonology for bronchiectasis and by gastroenterology for fatty liver disease at [facility]  Summary: 63 year old woman followed by pulmonology at [facility] for bronchiectasis presented with shortness of breath and wheezing. She was tachypneic, dyspneic and tachycardic and so referred for admission.  Assessment/Plan: 1. Suspected acute bronchitis superimposed on chronic bronchiectasis. PCR for influenza negative, nevertheless treat with Tamiflu based on symptoms. CT chest negative for PE, acute pulmonary pathology. Much improved. Plan discharge home. 2. Mild aortic stenosis. Appears to be asymptomatic. Followup as an outpatient. 3. Mild dehydration. Resolved. 4. Bronchiectasis by report. Chronic dyspnea on exertion limits her daily activities. 5. Fatty liver disease. Mild transaminitis presumably secondary to fatty liver. 6. Diabetes mellitus type 2 on insulin. Fair control. Hold metformin. Should improve as steroids taper. 7. EKG with nonspecific changes. No chest pain. Troponin is negative. Patient reports dyspnea at baseline. Follow clinically. No signs or symptoms to suggest ACS. Discussed with cardiology, no further recommendations. 8. Elevated TSH. Followup as an outpatient. 9. Coronary artery calcification. Prior negative catheterization 2012 (I do not have access to these records). Asymptomatic. Followup as an outpatient.   Home today on Cipro, steroid taper.  Brendia Sacks, MD  [facility]  Pager [phone] If 7PM-7AM, please contact night-coverage at [email or web address], password [facility] 06/10/2013, 1:37 PM  LOS: 2 days   Consultants:    Procedures:  2-D echocardiogram. LVEF 65-70%. Normal wall motion. Grade 1 diastolic dysfunction. Mild aortic stenosis.  Antibiotics:  Levaquin 4/1  Cipro 4/2 >> 4/8  HPI/Subjective: Continues to feel better. Breathing better. Less  short of breath. No new chest pain.   Objective: Filed Vitals:   06/09/13 1947 06/09/13 2037 06/10/13 0429 06/10/13 0809  BP:  120/46 98/44   Pulse:  87 57   Temp:  97.9 F (36.6 C) 97.8 F (36.6 C)   TempSrc:  Oral Oral   Resp:  24 20   Height:      Weight:   84.2 kg (185 lb 10 oz)   SpO2: 97% 93% 95% 97%    Intake/Output Summary (Last 24 hours) at 06/10/13 1337 Last data filed at 06/10/13 0946  Gross per 24 hour  Intake   1545 ml  Output   2100 ml  Net   -555 ml     Filed Weights   06/08/13 2015 06/09/13 0343 06/10/13 0429  Weight: 81.647 kg (180 lb) 81.5 kg (179 lb 10.8 oz) 84.2 kg (185 lb 10 oz)    Exam:   Afebrile, vital signs are stable. No hypoxia.  General. Appears calm and comfortable. Speech fluent and clear.  Cardiovascular regular rate and rhythm. No murmur, rub or gallop.  Telemetry sinus rhythm.  Respiratory clear to auscultation bilaterally. No wheezes, rales or rhonchi. Normal respiratory effort.  Psychiatric. Grossly normal mood and affect. Speech fluent and appropriate.  Data Reviewed:  Capillary blood sugars stable.  Basic metabolic panel unremarkable. AST is normalized. ALT trending downwards.  Scheduled Meds: . albuterol  2.5 mg Nebulization TID  . aspirin EC  81 mg Oral Daily  . ciprofloxacin  500 mg Oral BID  . docusate sodium  100 mg Oral BID  . enoxaparin (LOVENOX) injection  40 mg Subcutaneous Q24H  . fluticasone  1 spray Each Nare Daily  . guaiFENesin  600 mg Oral BID  . insulin aspart  0-15 Units Subcutaneous TID WC  . insulin aspart  0-5 Units Subcutaneous QHS  .  insulin glargine  12 Units Subcutaneous QHS  . oseltamivir  75 mg Oral BID  . pantoprazole  40 mg Oral Daily  . predniSONE  40 mg Oral Q breakfast  . sodium chloride  3 mL Intravenous Q12H   Continuous Infusions:    Principal Problem:   Acute bronchitis Active Problems:   Dyspnea   Bronchitis   Bronchiectasis   DM type 2 (diabetes mellitus, type 2)    HTN (hypertension), benign

## 2013-06-10 NOTE — Discharge Summary (Signed)
Physician Discharge Summary  Bianca Rogers NWG:[id number] DOB: [birth year] DOA: 06/08/2013  PCP: Bianca Lofty, FNP  Admit date: 06/08/2013 Discharge date: 06/10/2013  Recommendations for Outpatient Follow-up:  1. Followup resolution of suspected acute bronchitis 2. Mild aortic stenosis, felt to be asymptomatic asymptomatic 3. Coronary artery calcification, further evaluation as clinically indicated. 4. Elevated TSH. Consider further evaluation as an outpatient.   Follow-up Information   Follow up with [facility], FNP. Schedule an appointment as soon as possible for a visit in 1 week.   Specialty:  Nurse Practitioner   Contact information:   [address] [place] [address] [phone]      Discharge Diagnoses:  1. Suspected acute bronchitis superimposed on chronic back bronchiectasis by report 2. Mild aortic stenosis, asymptomatic 3. History of bronchiectasis by report 4. History fatty liver disease 5. Diabetes mellitus type 2 6. Elevated TSH 7. Coronary artery calcification  Discharge Condition: Improved Disposition: Home  Diet recommendation: Diabetic  Filed Weights   06/08/13 2015 06/09/13 0343 06/10/13 0429  Weight: 81.647 kg (180 lb) 81.5 kg (179 lb 10.8 oz) 84.2 kg (185 lb 10 oz)    History of present illness:  63 year old woman followed by pulmonology at [facility] for bronchiectasis presented with shortness of breath and wheezing. She was tachypneic, dyspneic and tachycardic and so referred for admission.  Hospital Course:  The patient was admitted, treated empirically for acute bronchitis and bronchiectasis with nebulizer therapy and ciprofloxacin as well as steroids. Her condition rapidly improved, hospitalization was uncomplicated and she is now stable for discharge. Individual issues as below.  1. Suspected acute bronchitis superimposed on chronic bronchiectasis. PCR for influenza negative, nevertheless treat with Tamiflu based on symptoms. CT chest negative  for PE, acute pulmonary pathology. Much improved. Plan discharge home. 2. Mild aortic stenosis. Appears to be asymptomatic. Followup as an outpatient. 3. Mild dehydration. Resolved. 4. Bronchiectasis by report. Chronic dyspnea on exertion limits her daily activities. 5. Fatty liver disease. Mild transaminitis presumably secondary to fatty liver. 6. Diabetes mellitus type 2 on insulin. Fair control. Hold metformin. Should improve as steroids taper. 7. EKG with nonspecific changes. No chest pain. Troponin is negative. Patient reports dyspnea at baseline. Follow clinically. No signs or symptoms to suggest ACS. Discussed with cardiology Dr. Tenny Rogers and reviewed EKGs, no further recommendations.  8. Elevated TSH. Followup as an outpatient. 9. Coronary artery calcification. Prior negative catheterization 2012 (I do not have access to these records). Asymptomatic. Followup as an outpatient.  Consultants: none Procedures:  2-D echocardiogram. LVEF 65-70%. Normal wall motion. Grade 1 diastolic dysfunction. Mild aortic stenosis. Antibiotics:  Levaquin 4/1  Cipro 4/2 >> 4/8  Discharge Instructions  Discharge Orders   Future Orders Complete By Expires   Diet Carb Modified  As directed    Discharge instructions  As directed    Comments:     Call your physician or seek immediate medical attention for shortness of breath, increased wheezing or worsening of condition.   Increase activity slowly  As directed        Medication List         aspirin EC 81 MG tablet  Take 81 mg by mouth daily.     ciprofloxacin 500 MG tablet  Commonly known as:  CIPRO  Take 1 tablet (500 mg total) by mouth 2 (two) times daily.     Diphenhydramine-APAP 12.5-500 MG Tabs  Take 1-2 tablets by mouth daily as needed (for allergy and cold).     insulin glargine 100 UNIT/ML  injection  Commonly known as:  LANTUS  Inject 12 Units into the skin at bedtime.     lisinopril-hydrochlorothiazide 10-12.5 MG per tablet    Commonly known as:  PRINZIDE,ZESTORETIC  Take 1 tablet by mouth daily.     loratadine 10 MG tablet  Commonly known as:  CLARITIN  Take 10 mg by mouth once as needed for allergies.     metFORMIN 500 MG tablet  Commonly known as:  GLUCOPHAGE  Take 1,000 mg by mouth 2 (two) times daily.     nitroGLYCERIN 0.4 MG SL tablet  Commonly known as:  NITROSTAT  Place 0.4 mg under the tongue every 5 (five) minutes as needed for chest pain.     omeprazole 20 MG capsule  Commonly known as:  PRILOSEC  Take 20 mg by mouth daily.     oseltamivir 75 MG capsule  Commonly known as:  TAMIFLU  Take 1 capsule (75 mg total) by mouth 2 (two) times daily.     predniSONE 10 MG tablet  Commonly known as:  DELTASONE  Take 20 mg by mouth daily for 3 days. Then take 10 mg by mouth daily for 3 days, then stop.     PROVENTIL HFA 108 (90 BASE) MCG/ACT inhaler  Generic drug:  albuterol  Inhale 1 puff into the lungs every 6 (six) hours as needed for wheezing or shortness of breath.       Allergies  Allergen Reactions  . Penicillins Rash  . Codeine Other (See Comments)    Irritable, insomnia  . Zoloft [Sertraline Hcl] Other (See Comments)    Paradoxical response    The results of significant diagnostics from this hospitalization (including imaging, microbiology, ancillary and laboratory) are listed below for reference.    Significant Diagnostic Studies: Ct Chest W Contrast  06/09/2013   CLINICAL DATA:  Difficulty breathing.  EXAM: CT CHEST WITH CONTRAST  TECHNIQUE: Multidetector CT imaging of the chest was performed during intravenous contrast administration.  CONTRAST:  80mL OMNIPAQUE IOHEXOL 300 MG/ML  SOLN  COMPARISON:  Chest x-ray obtained 06/08/2013  FINDINGS: Mediastinum: Unremarkable CT appearance of the thyroid gland. No suspicious mediastinal or hilar adenopathy. No soft tissue mediastinal mass. The thoracic esophagus is unremarkable.  Heart/Vascular: Conventional 3 vessel aortic arch. No  aneurysmal dilatation or dissection. Mild atherosclerotic calcifications without a stenosis. Main pulmonary artery within normal limits for size. No large central pulmonary embolus. Atherosclerotic calcifications noted in the coronary arteries. The heart is within normal limits for size. No pericardial effusion.  Lungs/Pleura: The lungs are clear.  No pleural effusion.  Bones/Soft Tissues: No acute fracture or aggressive appearing lytic or blastic osseous lesion.  Upper Abdomen: Surgical changes of prior cholecystectomy. Otherwise, the imaged upper abdomen is unremarkable.  IMPRESSION: 1. No acute cardiopulmonary process. 2. Coronary artery calcifications. Please note that although the presence of coronary artery calcium documents the presence of coronary artery disease, the severity of this disease and any potential stenosis cannot be assessed on this non-gated CT examination. Assessment for potential risk factor modification, dietary therapy or pharmacologic therapy may be warranted, if clinically indicated.   Electronically Signed   By: Malachy Moan M.D.   On: 06/09/2013 01:38   Dg Chest Portable 1 View  06/08/2013   CLINICAL DATA:  Shortness of breath.  Wheezing.  EXAM: PORTABLE CHEST - 1 VIEW  COMPARISON:  None.  FINDINGS: 2030 hrs. Vascular congestion with probable interstitial pulmonary edema. No dense focal airspace consolidation or pleural effusion. Cardiopericardial silhouette is  at upper limits of normal for size. Imaged bony structures of the thorax are intact. Telemetry leads overlie the chest.  IMPRESSION: Vascular congestion with probable interstitial edema.   Electronically Signed   By: Kennith CenterEric  Mansell M.D.   On: 06/08/2013 20:41    Labs: Basic Metabolic Panel:  Recent Labs Lab 06/08/13 2020 06/10/13 0710  NA 143 143  K 4.0 4.0  CL 102 106  CO2 26 27  GLUCOSE 149* 186*  BUN 13 20  CREATININE 0.70 0.68  CALCIUM 9.8 9.2   Liver Function Tests:  Recent Labs Lab 06/08/13 2020  06/10/13 0710  AST 53* 32  ALT 60* 47*  ALKPHOS 150* 108  BILITOT 0.6 0.3  PROT 8.4* 6.6  ALBUMIN 4.0 3.0*   CBC:  Recent Labs Lab 06/08/13 2020 06/10/13 0710  WBC 10.0 8.4  NEUTROABS 6.2  --   HGB 12.7 10.8*  HCT 38.5 33.2*  MCV 88.7 89.5  PLT 205 167   Cardiac Enzymes:  Recent Labs Lab 06/08/13 2020 06/09/13 0415 06/09/13 0940 06/09/13 1530  TROPONINI <0.30 <0.30 <0.30 <0.30     Recent Labs  06/08/13 2047  PROBNP 175.8*   CBG:  Recent Labs Lab 06/09/13 1116 06/09/13 1713 06/09/13 2031 06/10/13 0737 06/10/13 1125  GLUCAP 337* 285* 370* 165* 232*    Principal Problem:   Acute bronchitis Active Problems:   Dyspnea   Bronchitis   Bronchiectasis   DM type 2 (diabetes mellitus, type 2)   HTN (hypertension), benign   Time coordinating discharge: 25 minutes  Signed:  Brendia Sacksaniel Goodrich, MD [facility] 06/10/2013, 1:48 PM

## 2013-06-10 NOTE — Discharge Planning (Signed)
Pt stated that she was ready to be DCd and pain is under control.  IV and tele were removed.  Pt given DC paper and educated on future s/sx of continued infection and prevention.  Pt told when to call the doctor and when to return to the hospital.  Pt will be wheeled to car when ready.

## 2015-09-23 IMAGING — CT CT CHEST W/ CM
2 of 3 series · 15 of 36 positions shown, 18 images · IV contrast (Omnipaque 300)
Comparison: Chest x-ray obtained 06/08/2013

CLINICAL DATA: Difficulty breathing.

EXAM:
CT CHEST WITH CONTRAST
TECHNIQUE: Multidetector CT imaging of the chest was performed during
intravenous contrast administration.
CONTRAST:  80mL OMNIPAQUE IOHEXOL 300 MG/ML  SOLN

[Series 2: chestroutine 5.0 b40f · axial · 0.69mm/px · z∈[-270,-6]mm · 12 of 63 slices shown, 15 images]
[im 5/63  mediastinal]
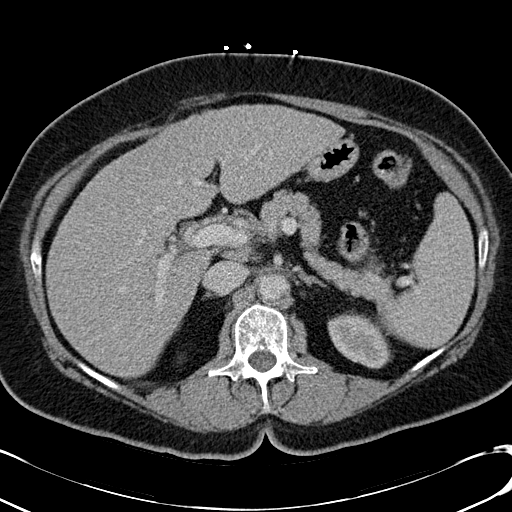
[im 5/63  lung]
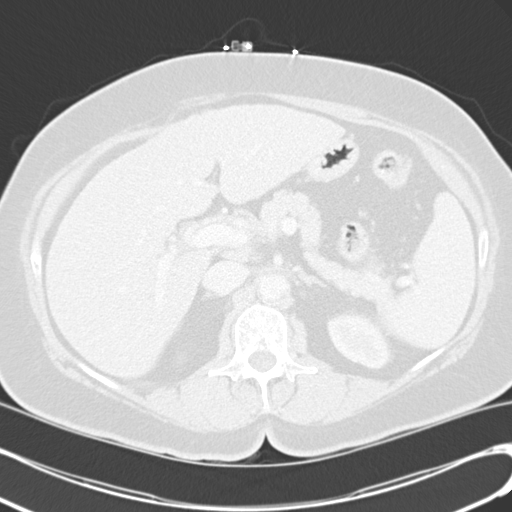
[im 10/63  lung]
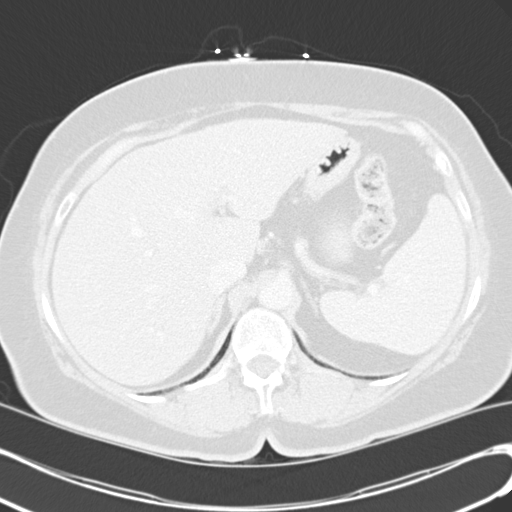
[im 14/63  lung]
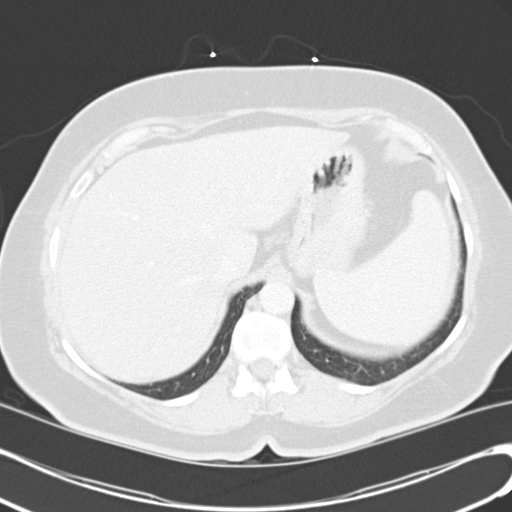
[im 19/63  lung]
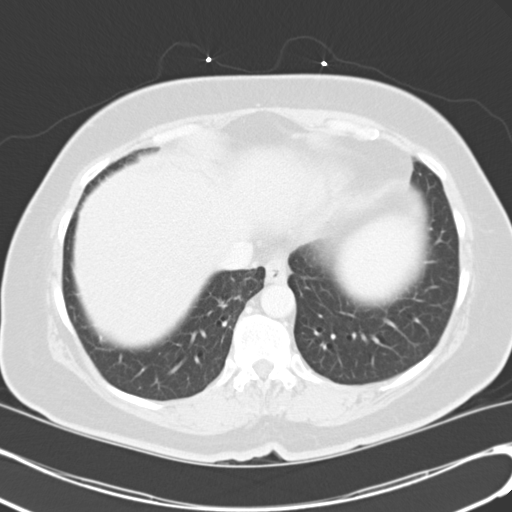
[im 23/63  mediastinal]
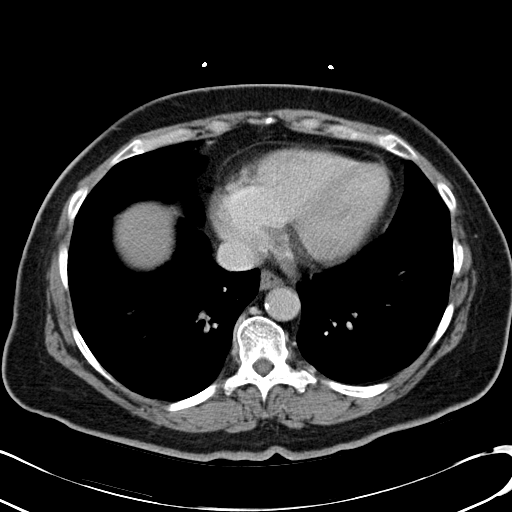
[im 23/63  lung]
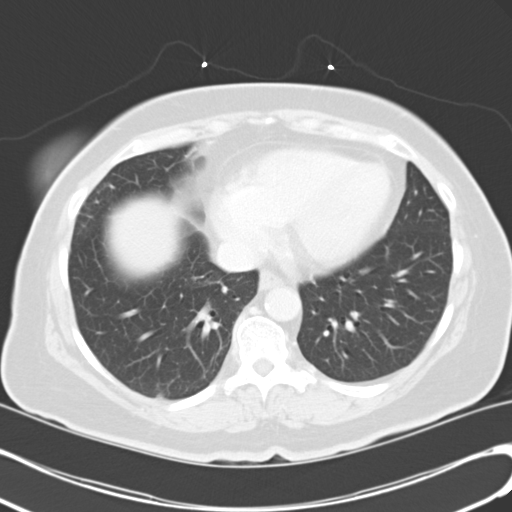
[im 28/63  lung]
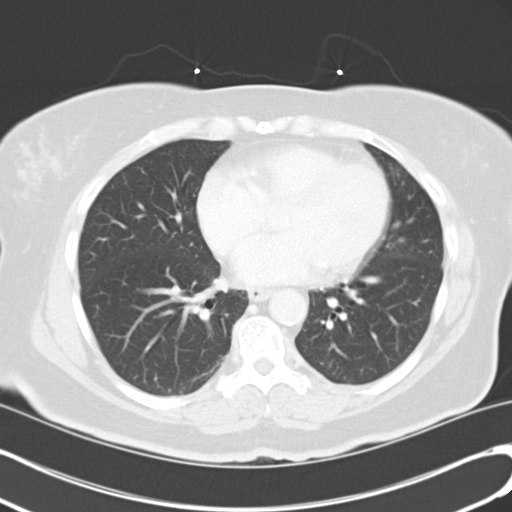
[im 35/63  lung]
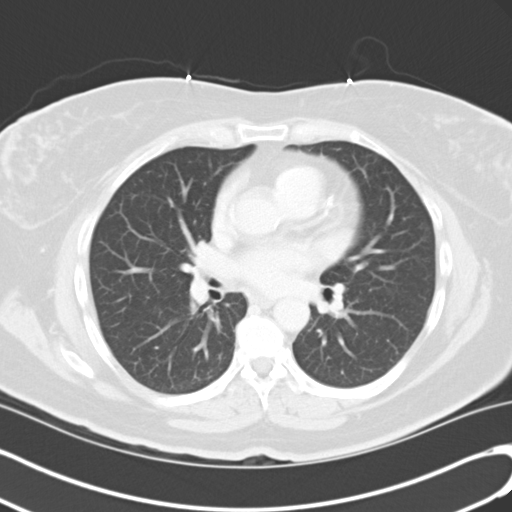
[im 40/63  lung]
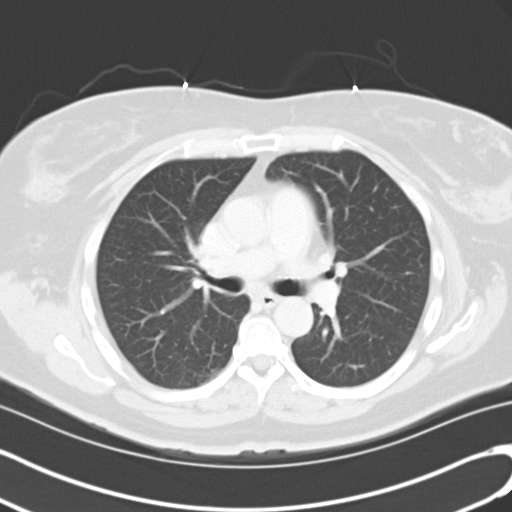
[im 44/63  mediastinal]
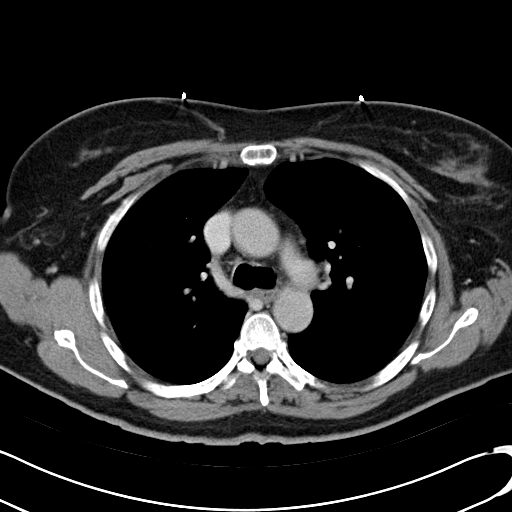
[im 44/63  lung]
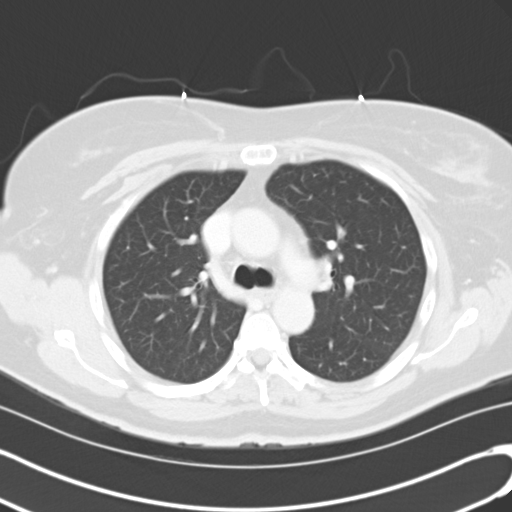
[im 49/63  lung]
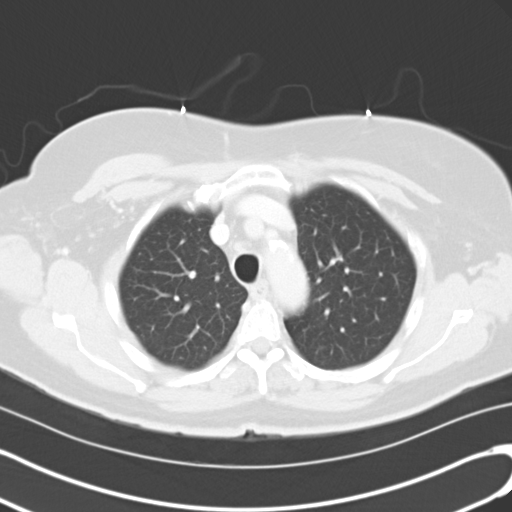
[im 53/63  lung]
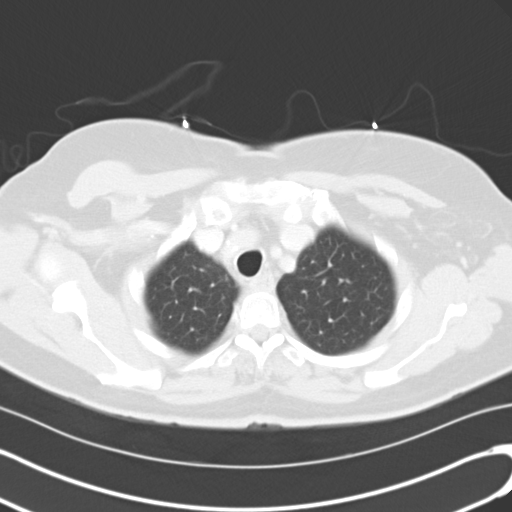
[im 58/63  lung]
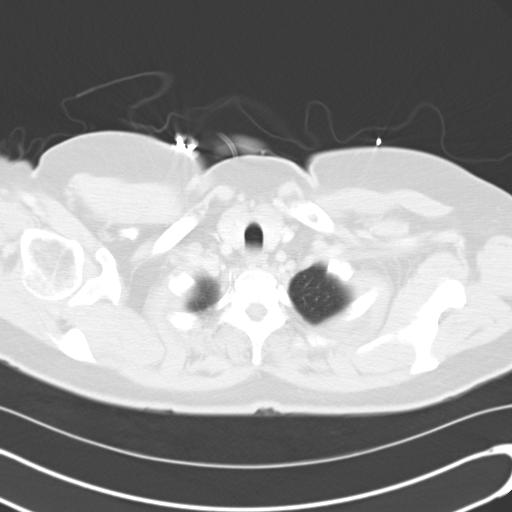

[Series 4: mpr coronal chest 3mm · coronal · 0.61mm/px · 3 of 90 slices shown]
[im 18/90  lung]
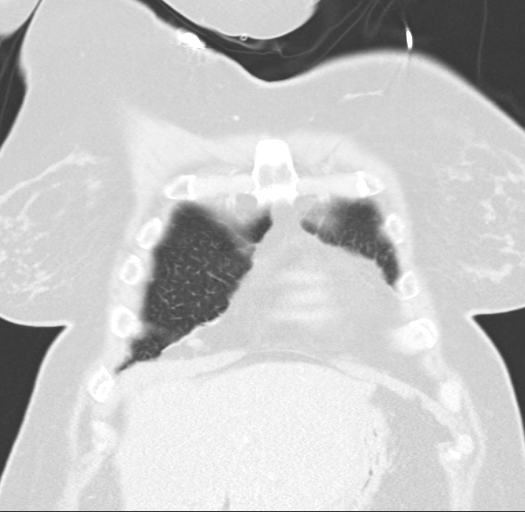
[im 36/90  lung]
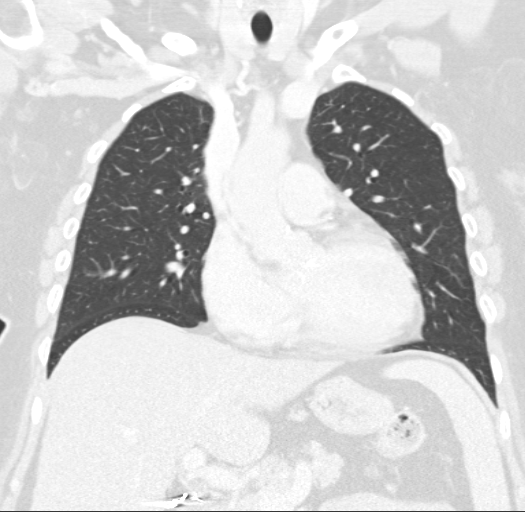
[im 54/90  lung]
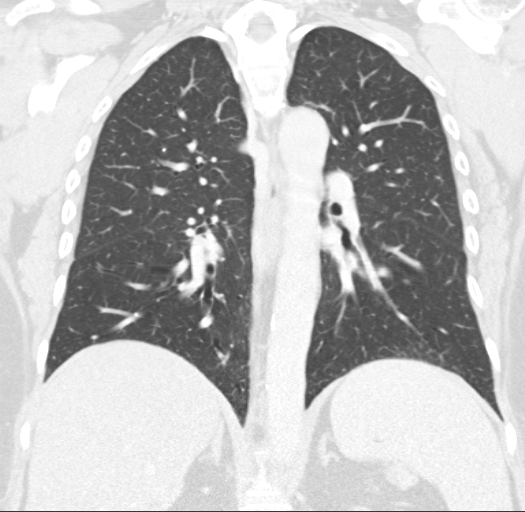

[15 of 36 positions shown; findings below may reference images not displayed]

FINDINGS: Mediastinum: Unremarkable CT appearance of the thyroid gland. No
suspicious mediastinal or hilar adenopathy. No soft tissue
mediastinal mass. The thoracic esophagus is unremarkable.

Heart/Vascular: Conventional 3 vessel aortic arch. No aneurysmal
dilatation or dissection. Mild atherosclerotic calcifications
without a stenosis. Main pulmonary artery within normal limits for
size. No large central pulmonary embolus. Atherosclerotic
calcifications noted in the coronary arteries. The heart is within
normal limits for size. No pericardial effusion.

Lungs/Pleura: The lungs are clear.  No pleural effusion.

Bones/Soft Tissues: No acute fracture or aggressive appearing lytic
or blastic osseous lesion.

Upper Abdomen: Surgical changes of prior cholecystectomy. Otherwise,
the imaged upper abdomen is unremarkable.
IMPRESSION: 1. No acute cardiopulmonary process.
2. Coronary artery calcifications. Please note that although the
presence of coronary artery calcium documents the presence of
coronary artery disease, the severity of this disease and any
potential stenosis cannot be assessed on this non-gated CT
examination. Assessment for potential risk factor modification,
dietary therapy or pharmacologic therapy may be warranted, if
clinically indicated.

## 2015-12-11 DIAGNOSIS — N750 Cyst of Bartholin's gland: Secondary | ICD-10-CM

## 2015-12-11 HISTORY — DX: Cyst of Bartholin's gland: N75.0

## 2016-12-09 DIAGNOSIS — K602 Anal fissure, unspecified: Secondary | ICD-10-CM

## 2016-12-09 HISTORY — DX: Anal fissure, unspecified: K60.2

## 2019-07-07 DIAGNOSIS — K661 Hemoperitoneum: Secondary | ICD-10-CM | POA: Insufficient documentation

## 2019-07-07 HISTORY — DX: Hemoperitoneum: K66.1

## 2020-04-02 DIAGNOSIS — F325 Major depressive disorder, single episode, in full remission: Secondary | ICD-10-CM | POA: Insufficient documentation

## 2020-04-02 DIAGNOSIS — I779 Disorder of arteries and arterioles, unspecified: Secondary | ICD-10-CM | POA: Insufficient documentation

## 2020-04-02 HISTORY — DX: Disorder of arteries and arterioles, unspecified: I77.9

## 2020-04-02 HISTORY — DX: Major depressive disorder, single episode, in full remission: F32.5

## 2020-09-03 DIAGNOSIS — M1712 Unilateral primary osteoarthritis, left knee: Secondary | ICD-10-CM | POA: Insufficient documentation

## 2020-09-03 DIAGNOSIS — S83419A Sprain of medial collateral ligament of unspecified knee, initial encounter: Secondary | ICD-10-CM | POA: Insufficient documentation

## 2020-09-03 HISTORY — DX: Sprain of medial collateral ligament of unspecified knee, initial encounter: S83.419A

## 2020-09-03 HISTORY — DX: Unilateral primary osteoarthritis, left knee: M17.12

## 2020-10-18 DIAGNOSIS — Z9081 Acquired absence of spleen: Secondary | ICD-10-CM

## 2020-10-18 HISTORY — DX: Acquired absence of spleen: Z90.81

## 2020-12-03 DIAGNOSIS — M25562 Pain in left knee: Secondary | ICD-10-CM

## 2020-12-03 HISTORY — DX: Pain in left knee: M25.562

## 2021-02-28 DIAGNOSIS — U071 COVID-19: Secondary | ICD-10-CM | POA: Insufficient documentation

## 2021-02-28 HISTORY — DX: COVID-19: U07.1

## 2021-08-21 DIAGNOSIS — Z96659 Presence of unspecified artificial knee joint: Secondary | ICD-10-CM | POA: Insufficient documentation

## 2021-08-21 HISTORY — DX: Presence of unspecified artificial knee joint: Z96.659

## 2022-01-09 DIAGNOSIS — M5412 Radiculopathy, cervical region: Secondary | ICD-10-CM

## 2022-01-09 HISTORY — DX: Radiculopathy, cervical region: M54.12

## 2022-07-23 LAB — HM DIABETES EYE EXAM

## 2022-08-29 DIAGNOSIS — J45909 Unspecified asthma, uncomplicated: Secondary | ICD-10-CM | POA: Insufficient documentation

## 2022-08-29 DIAGNOSIS — E785 Hyperlipidemia, unspecified: Secondary | ICD-10-CM

## 2022-08-29 DIAGNOSIS — G43909 Migraine, unspecified, not intractable, without status migrainosus: Secondary | ICD-10-CM

## 2022-08-29 HISTORY — DX: Unspecified asthma, uncomplicated: J45.909

## 2022-08-29 HISTORY — DX: Hyperlipidemia, unspecified: E78.5

## 2022-08-29 HISTORY — DX: Migraine, unspecified, not intractable, without status migrainosus: G43.909

## 2022-12-17 ENCOUNTER — Ambulatory Visit: Payer: Self-pay | Admitting: Family Medicine

## 2023-02-04 ENCOUNTER — Ambulatory Visit: Payer: Self-pay | Admitting: Family Medicine

## 2023-02-27 NOTE — Progress Notes (Unsigned)
New Patient Office Visit  Subjective    Patient ID: Bianca Rogers, female    DOB: 10/12/1950  Age: 72 y.o. MRN: [id number]  CC: No chief complaint on file.   HPI Bianca Rogers is a 72 y.o. female presents to establish care.  Hypertension:  Type 2 diabetes  Genella Rife   ***  Outpatient Encounter Medications as of 03/02/2023  Medication Sig   albuterol (PROVENTIL HFA) 108 (90 BASE) MCG/ACT inhaler Inhale 1 puff into the lungs every 6 (six) hours as needed for wheezing or shortness of breath.   aspirin EC 81 MG tablet Take 81 mg by mouth daily.   ciprofloxacin (CIPRO) 500 MG tablet Take 1 tablet (500 mg total) by mouth 2 (two) times daily.   Diphenhydramine-APAP 12.5-500 MG TABS Take 1-2 tablets by mouth daily as needed (for allergy and cold).   insulin glargine (LANTUS) 100 UNIT/ML injection Inject 12 Units into the skin at bedtime.   lisinopril-hydrochlorothiazide (PRINZIDE,ZESTORETIC) 10-12.5 MG per tablet Take 1 tablet by mouth daily.   loratadine (CLARITIN) 10 MG tablet Take 10 mg by mouth once as needed for allergies.   metFORMIN (GLUCOPHAGE) 500 MG tablet Take 1,000 mg by mouth 2 (two) times daily.   nitroGLYCERIN (NITROSTAT) 0.4 MG SL tablet Place 0.4 mg under the tongue every 5 (five) minutes as needed for chest pain.   omeprazole (PRILOSEC) 20 MG capsule Take 20 mg by mouth daily.   oseltamivir (TAMIFLU) 75 MG capsule Take 1 capsule (75 mg total) by mouth 2 (two) times daily.   predniSONE (DELTASONE) 10 MG tablet Take 20 mg by mouth daily for 3 days. Then take 10 mg by mouth daily for 3 days, then stop.   No facility-administered encounter medications on file as of 03/02/2023.    Past Medical History:  Diagnosis Date   Bronchiectasis (HCC)    Diabetes mellitus without complication (HCC)    Hypertension    Lung nodule    Non-alcoholic cirrhosis (HCC)     Past Surgical History:  Procedure Laterality Date   ABDOMINAL HYSTERECTOMY     CARDIAC CATHETERIZATION  2014    Negative   CESAREAN SECTION     CHOLECYSTECTOMY     TONSILLECTOMY      Family History  Problem Relation Age of Onset   Lung cancer Father    Transient ischemic attack Mother    Thyroid disease Mother     Social History   Socioeconomic History   Marital status: Married    Spouse name: Not on file   Number of children: Not on file   Years of education: Not on file   Highest education level: Not on file  Occupational History   Not on file  Tobacco Use   Smoking status: Former    Current packs/day: 0.00    Types: Cigarettes    Quit date: 06/10/1983    Years since quitting: 39.7   Smokeless tobacco: Not on file  Substance and Sexual Activity   Alcohol use: No   Drug use: No   Sexual activity: Not on file  Other Topics Concern   Not on file  Social History Narrative   Not on file   Social Drivers of Health   Financial Resource Strain: Low Risk  (10/21/2022)   Received from [facility]   Overall Financial Resource Strain (CARDIA)    Difficulty of Paying Living Expenses: Not very hard  Food Insecurity: No Food Insecurity (10/21/2022)   Received from [facility]  Hunger Vital Sign    Worried About Running Out of Food in the Last Year: Never true    Ran Out of Food in the Last Year: Never true  Transportation Needs: No Transportation Needs (10/21/2022)   Received from [facility] - Transportation    Lack of Transportation (Medical): No    Lack of Transportation (Non-Medical): No  Physical Activity: Unknown (10/21/2022)   Received from [facility]   Exercise Vital Sign    Days of Exercise per Week: 0 days    Minutes of Exercise per Session: Not on file  Stress: No Stress Concern Present (10/21/2022)   Received from [facility] - Occupational Stress Questionnaire    Feeling of Stress : Only a little  Social Connections: Socially Integrated (10/21/2022)   Received from [facility]   Social Network    How  would you rate your social network (family, work, friends)?: Good participation with social networks  Intimate Partner Violence: Not At Risk (10/21/2022)   Received from [facility]   HITS    Over the last 12 months how often did your partner physically hurt you?: Never    Over the last 12 months how often did your partner insult you or talk down to you?: Never    Over the last 12 months how often did your partner threaten you with physical harm?: Never    Over the last 12 months how often did your partner scream or curse at you?: Never    ROS      Objective    There were no vitals taken for this visit.  Physical Exam  {Labs (Optional):23779}    Assessment & Plan:  There are no diagnoses linked to this encounter.   No follow-ups on file.   Modesto Charon, NP

## 2023-03-02 ENCOUNTER — Ambulatory Visit: Payer: Medicare PPO | Admitting: General Practice

## 2023-03-02 ENCOUNTER — Encounter: Payer: Self-pay | Admitting: General Practice

## 2023-03-02 VITALS — BP 128/80 | HR 61 | Temp 97.7°F | Ht 63.0 in | Wt 163.0 lb

## 2023-03-02 DIAGNOSIS — Z7984 Long term (current) use of oral hypoglycemic drugs: Secondary | ICD-10-CM

## 2023-03-02 DIAGNOSIS — E1140 Type 2 diabetes mellitus with diabetic neuropathy, unspecified: Secondary | ICD-10-CM | POA: Diagnosis not present

## 2023-03-02 DIAGNOSIS — I10 Essential (primary) hypertension: Secondary | ICD-10-CM | POA: Diagnosis not present

## 2023-03-02 DIAGNOSIS — K219 Gastro-esophageal reflux disease without esophagitis: Secondary | ICD-10-CM | POA: Diagnosis not present

## 2023-03-02 DIAGNOSIS — Z23 Encounter for immunization: Secondary | ICD-10-CM

## 2023-03-02 DIAGNOSIS — K7581 Nonalcoholic steatohepatitis (NASH): Secondary | ICD-10-CM

## 2023-03-02 DIAGNOSIS — Z7689 Persons encountering health services in other specified circumstances: Secondary | ICD-10-CM

## 2023-03-02 DIAGNOSIS — J479 Bronchiectasis, uncomplicated: Secondary | ICD-10-CM

## 2023-03-02 DIAGNOSIS — K7469 Other cirrhosis of liver: Secondary | ICD-10-CM | POA: Insufficient documentation

## 2023-03-02 DIAGNOSIS — K7460 Unspecified cirrhosis of liver: Secondary | ICD-10-CM | POA: Insufficient documentation

## 2023-03-02 HISTORY — DX: Persons encountering health services in other specified circumstances: Z76.89

## 2023-03-02 MED ORDER — GABAPENTIN 100 MG PO CAPS: 100.0000 mg | ORAL_CAPSULE | Freq: Two times a day (BID) | ORAL | 0 refills | Status: DC

## 2023-03-02 MED ORDER — OMEPRAZOLE 20 MG PO CPDR: 20.0000 mg | DELAYED_RELEASE_CAPSULE | Freq: Every day | ORAL | 0 refills | Status: DC

## 2023-03-02 NOTE — Assessment & Plan Note (Signed)
Bp at goal today.   Continue Lisinopril-hydrochlorothiazide 10-12.5 mg once daily.

## 2023-03-02 NOTE — Assessment & Plan Note (Signed)
Last A1C completed in 08/2022, which was 8.6.   Continue gabapentin 100 mg BID for neuropathy.   Continue metformin 1000 mg BID for diabetes.   Follow up in 1 week to complete repeat hemoglobin A1c, kidney heath evaluation, foot exam.

## 2023-03-02 NOTE — Patient Instructions (Signed)
Referrals have been placed for pulmonology and gastroenterology. You will get a phone call with the instructions.   Refills sent for gabapentin and omeprazole.   Schedule follow up for next week to discuss mammogram, bone density and neurology referral.   It was a pleasure meeting you!

## 2023-03-02 NOTE — Assessment & Plan Note (Signed)
Stable.   Continue Flovent, Albuterol PRN and Flutter valve.   Referral placed for pulmonologist.

## 2023-03-02 NOTE — Assessment & Plan Note (Signed)
Reviewed notes, labs, imaging on care everywhere.

## 2023-03-02 NOTE — Assessment & Plan Note (Signed)
Uncontrolled.   Discussed with patient to avoid fried, greasy, spicy foods.   Restart omeprazole 20 mg once daily.   Referral placed for GI per patient request to discuss endoscopy.

## 2023-03-02 NOTE — Assessment & Plan Note (Signed)
Stable. Followed with GI and [facility] liver specialist.   Discussed to continue to avoid alcohol and tylenol.   Reviewed notes from GI doctor from September, 2024.   Referral placed to establish with new GI in [place].

## 2023-03-06 ENCOUNTER — Encounter: Payer: Self-pay | Admitting: Pulmonary Disease

## 2023-03-12 ENCOUNTER — Ambulatory Visit (INDEPENDENT_AMBULATORY_CARE_PROVIDER_SITE_OTHER): Payer: Medicare HMO | Admitting: General Practice

## 2023-03-12 ENCOUNTER — Encounter: Payer: Self-pay | Admitting: General Practice

## 2023-03-12 VITALS — BP 132/80 | HR 72 | Temp 97.6°F | Ht 63.0 in | Wt 162.0 lb

## 2023-03-12 DIAGNOSIS — N6320 Unspecified lump in the left breast, unspecified quadrant: Secondary | ICD-10-CM | POA: Diagnosis not present

## 2023-03-12 DIAGNOSIS — K219 Gastro-esophageal reflux disease without esophagitis: Secondary | ICD-10-CM | POA: Diagnosis not present

## 2023-03-12 DIAGNOSIS — Z7984 Long term (current) use of oral hypoglycemic drugs: Secondary | ICD-10-CM | POA: Diagnosis not present

## 2023-03-12 DIAGNOSIS — I10 Essential (primary) hypertension: Secondary | ICD-10-CM

## 2023-03-12 DIAGNOSIS — K7581 Nonalcoholic steatohepatitis (NASH): Secondary | ICD-10-CM

## 2023-03-12 DIAGNOSIS — E1140 Type 2 diabetes mellitus with diabetic neuropathy, unspecified: Secondary | ICD-10-CM

## 2023-03-12 DIAGNOSIS — K7460 Unspecified cirrhosis of liver: Secondary | ICD-10-CM

## 2023-03-12 DIAGNOSIS — J479 Bronchiectasis, uncomplicated: Secondary | ICD-10-CM

## 2023-03-12 LAB — COMPREHENSIVE METABOLIC PANEL
ALT: 35 U/L (ref 0–35)
AST: 45 U/L — ABNORMAL HIGH (ref 0–37)
Albumin: 4.1 g/dL (ref 3.5–5.2)
Alkaline Phosphatase: 112 U/L (ref 39–117)
BUN: 21 mg/dL (ref 6–23)
CO2: 28 meq/L (ref 19–32)
Calcium: 9.8 mg/dL (ref 8.4–10.5)
Chloride: 100 meq/L (ref 96–112)
Creatinine, Ser: 0.99 mg/dL (ref 0.40–1.20)
GFR: 56.66 mL/min — ABNORMAL LOW (ref >60.00)
Glucose, Bld: 124 mg/dL — ABNORMAL HIGH (ref 70–99)
Potassium: 4.4 meq/L (ref 3.5–5.1)
Sodium: 138 meq/L (ref 135–145)
Total Bilirubin: 0.4 mg/dL (ref 0.2–1.2)
Total Protein: 7.7 g/dL (ref 6.0–8.3)

## 2023-03-12 LAB — MICROALBUMIN / CREATININE URINE RATIO
Creatinine,U: 47.4 mg/dL
Microalb Creat Ratio: 1.5 mg/g (ref 0.0–30.0)
Microalb, Ur: <0.7 mg/dL (ref 0.0–1.9)

## 2023-03-12 LAB — HEMOGLOBIN A1C: Hgb A1c MFr Bld: 7.2 % — ABNORMAL HIGH (ref 4.6–6.5)

## 2023-03-12 NOTE — Assessment & Plan Note (Signed)
 Order placed for repeat diagnostic 3 d mammogram and [place] of left breast.

## 2023-03-12 NOTE — Assessment & Plan Note (Signed)
 Improving.   Continue omeprazole 20 mg once daily.   She still has not heard back about the referral for endoscopy.

## 2023-03-12 NOTE — Assessment & Plan Note (Signed)
 Stable.  No concerns today.   GI referral pending.   She will update me if she needs a liver specialist referral as she has not been established in the area. But was with [facility] liver specialist in the past. She will call the local office and find out if she needs a referral.

## 2023-03-12 NOTE — Patient Instructions (Addendum)
 Stop by the lab prior to leaving today. I will notify you of your results once received.   Continue metformin  and gabapentin . Let me know if there are any issues with the prescription.   You have an order for:  []   2D Mammogram  [x]   3D Mammogram diagnostic and ultrasound. []   Bone Density     Please call for appointment:  [facility]  [address] [place] [address] [phone] [facility] [address] [place], [place] [address] [phone] [facility] [address] [place], [place] [address] [phone]   Make sure to wear two-piece clothing.  No lotions, powders, or deodorants the day of the appointment. Make sure to bring picture ID and insurance card.  Bring list of medications you are currently taking including any supplements.   Schedule your  screening mammogram through MyChart!   Log into your MyChart account.  Go to 'Visit' (or 'Appointments' if on mobile App) --> Schedule an Appointment  Under 'Select a Reason for Visit' choose the Mammogram Screening option.  Complete the pre-visit questions and select the time and place that best fits your schedule.   It was a pleasure to see you today!

## 2023-03-12 NOTE — Assessment & Plan Note (Signed)
 Stable.  Has a appointment with new Pulmonologist on 03/26/23.

## 2023-03-12 NOTE — Assessment & Plan Note (Signed)
 Repeat A1c pending.   Foot exam completed today.   Continue gabapentin 100 mg BID.  Continue Metformin 1000 mg BID for diabetes.   CMP pending Urine ACR pending.

## 2023-03-12 NOTE — Assessment & Plan Note (Signed)
 Controlled.

## 2023-03-12 NOTE — Progress Notes (Signed)
 Established Patient Office Visit  Subjective   Patient ID: Bianca Rogers, female    DOB: 06-25-1950  Age: 73 y.o. MRN: [id number]  Chief Complaint  Patient presents with   Gastroesophageal Reflux    States she is doing much better. Omeprazole  has really helped.    Issue with Gabapentin     Pharmacy would not refill it last week. Only received a 2 month supply last time.     Gastroesophageal Reflux She reports no abdominal pain, no chest pain, no heartburn or no nausea.    Bianca Rogers is a 73 year old female with past medical history of HTN, bronchiectasis, GERD, cirrhosis of liver, type 2 diabetes with neuropathy, presents today for a follow up.   Type 2 diabetes with neuropathy: Current medications include: metformin  1000mg  BID and gabapentin  100 mg BID. She has not tried to fill the medication that I sent last visit. She will try to get refill again.  She is checking her blood glucose 1-2 times daily and is getting readings of 120s  Last A1C: 8.6 in June. Last Eye Exam: completed within the year. [place] [facility] - need records.  Last Foot Exam: due Pneumonia Vaccination: up to date.  Urine Microalbumin:  due Statin: pravastatin   Dietary changes since last visit: doing better. She did have some sweets over christmas.   Exercise: not regularly.   Left breast mass: She had a mammogram in July and had a ultrasound. She was asked to repeat in 6 months. Her daughter, her sister and her niece have breast cancer. No pain or nipple discharge.   GERD: Managed on omeprazole  20 mg once daily. She is trying to improve her diet. She still hasn't heard back about the referral for endoscopy. She was seeing a liver specialist in [place] and she will call [facility] to see if she can just get an appointment as she is established with him or she needs a referral for a new liver specialist.   Anxiety: currently managed Effexor  XR 150 mg. Tolerating it well. No concerns today.   Patient Active  Problem List   Diagnosis Date Noted   Mass of left breast 03/12/2023   Gastroesophageal reflux disease 03/02/2023   Liver cirrhosis secondary to NASH (HCC) 03/02/2023   Bronchiectasis (HCC) 06/09/2013   Type 2 diabetes mellitus with diabetic neuropathy (HCC) 06/09/2013   HTN (hypertension), benign 06/09/2013   Past Medical History:  Diagnosis Date   Anal fissure 12/09/2016   Added automatically from request for surgery [id number]    Added automatically from request for surgery [id number]     Arthralgia of left knee 12/03/2020   Asthma 08/29/2022   Chronic/stable.    Follows with Dr. Elayne, pulmonology.   Using Flovent  and albuterol  inhaler as needed.     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     History of total knee arthroplasty 08/21/2021   Hyperlipidemia 08/29/2022   Lab Results   Component Value Date    Cholesterol 197 01/28/2022      Lab Results   Component Value Date    HDL 56 01/28/2022      Lab Results   Component Value Date  LDL Calculated 115 01/28/2022      Lab Results   Component Value Date    Triglycerides 131 01/28/2022      Lab Results   Component Value Date    Chol/HDL Ratio 3.5 01/28/2022      Criteria used to determine recommendation include:    Hypertension    Lung nodule    Major depressive disorder in full remission (HCC) 04/02/2020   Mood stable on venlafaxine  150 mg daily.     Migraine 08/29/2022   NAFLD (nonalcoholic fatty liver disease) 92/76/7985   Non-alcoholic cirrhosis (HCC)    Osteoarthritis of left knee 09/03/2020   Peritoneal hematoma 07/07/2019   Sprain of MCL (medial collateral ligament) of knee 09/03/2020   Symptom associated with female  genital organs 07/09/2010   Allergies  Allergen Reactions   Penicillins Rash    Other reaction(s): unknown reaction   Codeine Other (See Comments)    Irritable, insomnia   Zoloft [Sertraline Hcl] Other (See Comments)    Paradoxical response         03/02/2023    9:46 AM 03/02/2023    8:53 AM  Depression screen PHQ 2/9  Decreased Interest 0 0  Down, Depressed, Hopeless 1 0  PHQ - 2 Score 1 0  Altered sleeping 2   Tired, decreased energy 3   Change in appetite 1   Feeling bad or failure about yourself  0   Trouble concentrating 1   Moving slowly or fidgety/restless 1   Suicidal thoughts 0   PHQ-9 Score 9   Difficult doing work/chores Somewhat difficult        03/02/2023    9:46 AM  GAD 7 : Generalized Anxiety Score  Nervous, Anxious, on Edge 0  Control/stop worrying 1  Worry too much - different things 1  Trouble relaxing 1  Restless 0  Easily annoyed or irritable 1  Afraid - awful might happen 0  Total GAD 7 Score 4  Anxiety Difficulty Somewhat difficult      Review of Systems  Constitutional:  Negative for chills and fever.  Respiratory:  Negative for shortness of breath.   Cardiovascular:  Negative for chest pain.  Gastrointestinal:  Negative for abdominal pain, constipation, diarrhea, heartburn, nausea and vomiting.  Genitourinary:  Negative for dysuria, frequency and urgency.  Neurological:  Positive for headaches. Negative for dizziness.  Endo/Heme/Allergies:  Negative for polydipsia.  Psychiatric/Behavioral:  Negative for depression and suicidal ideas. The patient is not nervous/anxious.       Objective:     BP 132/80 (BP Location: Left Arm, Patient Position: Sitting, Cuff Size: Normal)   Pulse 72   Temp 97.6 F (36.4 C) (Oral)   Ht 5' 3 (1.6 m)   Wt 162 lb (73.5 kg)   SpO2 98%   BMI 28.70 kg/m  BP Readings from Last 3 Encounters:  03/12/23 132/80  03/02/23 128/80  06/10/13 (!) 140/58   Wt Readings from Last 3 Encounters:  03/12/23  162 lb (73.5 kg)  03/02/23 163 lb (73.9 kg)  06/10/13 185 lb 10 oz (84.2 kg)      Physical Exam Vitals and nursing note reviewed.  Constitutional:      Appearance: Normal appearance.  Cardiovascular:     Rate and Rhythm: Normal rate and regular rhythm.     Pulses: Normal pulses.     Heart sounds: Normal heart sounds.  Pulmonary:     Effort: Pulmonary effort is normal.     Breath sounds: Normal breath  sounds.  Neurological:     Mental Status: She is alert and oriented to person, place, and time.  Psychiatric:        Mood and Affect: Mood normal.        Behavior: Behavior normal.        Thought Content: Thought content normal.        Judgment: Judgment normal.      No results found for any visits on 03/12/23.     The 10-year ASCVD risk score (Arnett DK, et al., 2019) is: 31.9%    Assessment & Plan:  Type 2 diabetes mellitus with diabetic neuropathy, unspecified whether long term insulin  use (HCC) Assessment & Plan: Repeat A1c pending.   Foot exam completed today.   Continue gabapentin  100 mg BID.  Continue Metformin  1000 mg BID for diabetes.   CMP pending Urine ACR pending.  Orders: -     Hemoglobin A1c -     Comprehensive metabolic panel -     Microalbumin / creatinine urine ratio  Gastroesophageal reflux disease, unspecified whether esophagitis present Assessment & Plan: Improving.   Continue omeprazole  20 mg once daily.   She still has not heard back about the referral for endoscopy.    Mass of left breast, unspecified quadrant Assessment & Plan: Order placed for repeat diagnostic 3 d mammogram and US  of left breast.  Orders: -     MM 3D DIAGNOSTIC MAMMOGRAM UNILATERAL LEFT BREAST; Future -     US  LIMITED ULTRASOUND INCLUDING AXILLA LEFT BREAST ; Future  Liver cirrhosis secondary to NASH [facility]) Assessment & Plan: Stable.  No concerns today.   GI referral pending.   She will update me if she needs a liver specialist referral as she has not  been established in the area. But was with [facility] liver specialist in the past. She will call the local office and find out if she needs a referral.    Bronchiectasis without complication (HCC) Assessment & Plan: Stable.  Has a appointment with new Pulmonologist on 03/26/23.   HTN (hypertension), benign Assessment & Plan: Controlled .      Return in about 3 months (around 06/10/2023) for diabetes, HM, liver.    Carrol Aurora, NP

## 2023-03-18 ENCOUNTER — Other Ambulatory Visit: Payer: Self-pay | Admitting: *Deleted

## 2023-03-18 ENCOUNTER — Inpatient Hospital Stay
Admission: RE | Admit: 2023-03-18 | Discharge: 2023-03-18 | Disposition: A | Discharge status: Home / Self Care | Payer: Self-pay | Source: Ambulatory Visit | Attending: General Practice | Admitting: General Practice

## 2023-03-18 DIAGNOSIS — Z1231 Encounter for screening mammogram for malignant neoplasm of breast: Secondary | ICD-10-CM

## 2023-03-26 ENCOUNTER — Ambulatory Visit
Admission: RE | Admit: 2023-03-26 | Discharge: 2023-03-26 | Disposition: A | Discharge status: Home / Self Care | Payer: Medicare HMO | Source: Ambulatory Visit | Attending: General Practice | Admitting: General Practice

## 2023-03-26 ENCOUNTER — Other Ambulatory Visit: Payer: Self-pay | Admitting: General Practice

## 2023-03-26 DIAGNOSIS — N6320 Unspecified lump in the left breast, unspecified quadrant: Secondary | ICD-10-CM

## 2023-03-26 DIAGNOSIS — Z1239 Encounter for other screening for malignant neoplasm of breast: Secondary | ICD-10-CM | POA: Diagnosis not present

## 2023-03-26 DIAGNOSIS — N6323 Unspecified lump in the left breast, lower outer quadrant: Secondary | ICD-10-CM | POA: Insufficient documentation

## 2023-03-26 DIAGNOSIS — R92332 Mammographic heterogeneous density, left breast: Secondary | ICD-10-CM | POA: Diagnosis not present

## 2023-03-26 DIAGNOSIS — R928 Other abnormal and inconclusive findings on diagnostic imaging of breast: Secondary | ICD-10-CM

## 2023-03-27 ENCOUNTER — Telehealth: Payer: Self-pay

## 2023-03-27 NOTE — Telephone Encounter (Signed)
Called and discussed with patient. She is scheduled for biopsy on Tuesday morning.

## 2023-03-27 NOTE — Telephone Encounter (Signed)
Copied from CRM [id number]. Topic: Clinical - Lab/Test Results >> Mar 27, 2023  9:15 AM Theodis Sato wrote: Reason for CRM: Patient is requesting a phone call back from [place] Charon to discuss her mammogram results.   Called patient to let know we have received messages. And will be in touch after reviewed by provider. Patient sates that she has received call from breast center this morning. She has appointments set up. She does not have any further questions at this time. She will reach out if anything changes.

## 2023-03-27 NOTE — Telephone Encounter (Signed)
Copied from CRM [id number]. Topic: Referral - Question >> Mar 26, 2023  4:37 PM Alvino Blood C wrote: Reason for CRM: Patient had a breast exam today and the provider is wanting to complete a biopsy. The patient will need a referral sent before they are able to complete the biopsy. Patient is requesting a call back to discuss further instruction.

## 2023-03-31 ENCOUNTER — Ambulatory Visit
Admission: RE | Admit: 2023-03-31 | Discharge: 2023-03-31 | Disposition: A | Discharge status: Home / Self Care | Payer: Medicare HMO | Source: Ambulatory Visit | Attending: General Practice | Admitting: General Practice

## 2023-03-31 DIAGNOSIS — R928 Other abnormal and inconclusive findings on diagnostic imaging of breast: Secondary | ICD-10-CM

## 2023-03-31 DIAGNOSIS — C50512 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 female breast: Secondary | ICD-10-CM | POA: Insufficient documentation

## 2023-03-31 HISTORY — PX: BREAST BIOPSY: SHX20

## 2023-03-31 MED ORDER — LIDOCAINE 1 % OPTIME INJ - NO CHARGE
2.0000 mL | Freq: Once | INTRAMUSCULAR | Status: AC
  Administered 2023-03-31: 2 mL
  Filled 2023-03-31: qty 2

## 2023-03-31 MED ORDER — LIDOCAINE-EPINEPHRINE 2 %-1:100000 IJ SOLN
8.0000 mL | Freq: Once | INTRAMUSCULAR | Status: AC
  Administered 2023-03-31: 8 mL
  Filled 2023-03-31: qty 8.5

## 2023-04-01 ENCOUNTER — Encounter: Payer: Self-pay | Admitting: Student in an Organized Health Care Education/Training Program

## 2023-04-01 ENCOUNTER — Ambulatory Visit: Payer: Medicare HMO | Admitting: Student in an Organized Health Care Education/Training Program

## 2023-04-01 VITALS — BP 116/60 | HR 83 | Temp 97.6°F | Ht 63.0 in | Wt 164.2 lb

## 2023-04-01 DIAGNOSIS — J479 Bronchiectasis, uncomplicated: Secondary | ICD-10-CM

## 2023-04-01 DIAGNOSIS — J4540 Moderate persistent asthma, uncomplicated: Secondary | ICD-10-CM

## 2023-04-01 LAB — SURGICAL PATHOLOGY

## 2023-04-01 MED ORDER — FLUTICASONE-SALMETEROL 115-21 MCG/ACT IN AERO: 2.0000 | INHALATION_SPRAY | Freq: Two times a day (BID) | RESPIRATORY_TRACT | 12 refills | Status: DC

## 2023-04-01 NOTE — Progress Notes (Signed)
Assessment & Plan:   1. Moderate persistent asthma without complication (Primary) 2. Bronchiectasis without complication (HCC)  Presenting to establish care regarding asthma as well as bronchiectasis that was previously noted as very mild. Her asthma symptoms have been controlled with inhaled corticosteroids and she is running out of her current inhaler. Previous spirometry numbers from her 2022 PFT's suggest well controlled asthma. That said, she is describing a cough. Will switch to ICS/LABA for management of asthma, obtain fresh PFT's, and then will decide on therapy de-escalation (to potentially ICS/LABA or ICS/SABA PRN) based on result.  As to her previously described bronchiectasis with a previous culture growing mycobacterium porcinum, will update her chest imaging with repeat high resolution chest CT and compare to her previous imaging. Should this show nodularity or tree-in-bud, will consider flexible bronchoscopy to send a BAL for culture. Will also consider escalating her pulmonary clearance regimen on follow up depending on symptomatic response to ICS/LABA.  - fluticasone-salmeterol (ADVAIR HFA) 115-21 MCG/ACT inhaler; Inhale 2 puffs into the lungs 2 (two) times daily.  Dispense: 1 each; Refill: 12 - Pulmonary Function Test [facility] Only; Future - CT CHEST HIGH RESOLUTION; Future   Return in about 2 months (around 05/30/2023).  I spent 60 minutes caring for this patient today, including preparing to see the patient, obtaining a medical history , reviewing a separately obtained history, performing a medically appropriate examination and/or evaluation, counseling and educating the patient/family/caregiver, ordering medications, tests, or procedures, and documenting clinical information in the electronic health record  Raechel Chute, MD [facility] 04/01/2023 7:33 PM    End of visit medications:  Meds ordered this encounter  Medications   fluticasone-salmeterol  (ADVAIR HFA) 115-21 MCG/ACT inhaler    Sig: Inhale 2 puffs into the lungs 2 (two) times daily.    Dispense:  1 each    Refill:  12     Current Outpatient Medications:    albuterol (PROVENTIL HFA) 108 (90 BASE) MCG/ACT inhaler, Inhale 1 puff into the lungs every 6 (six) hours as needed for wheezing or shortness of breath., Disp: , Rfl:    Calcium Carbonate-Vitamin D (OYSTER SHELL CALCIUM/D) 500-5 MG-MCG TABS, Take 1 tablet by mouth daily., Disp: , Rfl:    EFFEXOR XR 150 MG 24 hr capsule, Take 150 mg by mouth daily with breakfast., Disp: , Rfl:    ezetimibe (ZETIA) 10 MG tablet, Take 10 mg by mouth daily., Disp: , Rfl:    fluticasone-salmeterol (ADVAIR HFA) 115-21 MCG/ACT inhaler, Inhale 2 puffs into the lungs 2 (two) times daily., Disp: 1 each, Rfl: 12   gabapentin (NEURONTIN) 100 MG capsule, Take 1 capsule (100 mg total) by mouth 2 (two) times daily., Disp: 60 capsule, Rfl: 0   lisinopril-hydrochlorothiazide (PRINZIDE,ZESTORETIC) 10-12.5 MG per tablet, Take 1 tablet by mouth daily., Disp: , Rfl:    loratadine (CLARITIN) 10 MG tablet, Take 10 mg by mouth once as needed for allergies., Disp: , Rfl:    metFORMIN (GLUCOPHAGE) 500 MG tablet, Take 1,000 mg by mouth 2 (two) times daily., Disp: , Rfl:    omeprazole (PRILOSEC) 20 MG capsule, Take 1 capsule (20 mg total) by mouth daily., Disp: 90 capsule, Rfl: 0   pravastatin (PRAVACHOL) 80 MG tablet, Take 80 mg by mouth daily., Disp: , Rfl:    Subjective:   PATIENT ID: Bianca Rogers GENDER: female DOB: [birth year], MRN: [id number]  Chief Complaint  Patient presents with   Consult    Cough, shortness of breath on  exertion and occasional at rest. Wheezing.     HPI  Patient is a pleasant 73 year old female presenting to clinic to establish care.  She has a past medical history of asthma as well as mild bronchiectasis which has been followed by pulmonary for the past 10 years. She also has a history of NAFLD. She was previously seen by Dr.  Sandy Salaam at [facility] who noted very mild bronchiectasis/bronchiolectasis in the lung bases. At that time, workup for immune deficiency and alpha-1 deficiency was non-revealing. She was subsequently followed by Dr. Westley Hummer at [facility] in [place]. She'd developed cough productive of sputum over the years while there, and was maintaeind on Flovent twice daily for management of asthma. 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  Today, she is in her usual state of health. She reports a cough that is productive of some sputum. She does not have any wheeze or exertional dyspnea. She does not have any chest pain or chest tightness. She does not have any fevers, chills, night sweats, or weight changes.  Patient does have a very distant history of smoking for around 5 years in the early 13s. Denies any significant occupational history or exposures.  PFT 08/2020 at [facility]: FVC 95%, FEV1 106%, FEV1/FVC 0.86, TLC 88%, DLCO 60%, DLCO/VA 70%   Vaccination history: Received Meningococcal vaccine, pneumococcal vaccination series (PCV-13 2017/2021 & PSV-23 2020 and 2021).  Ancillary information including prior medications, full medical/surgical/family/social histories, and PFTs (when available) are listed below and have been reviewed.   Review of Systems  Constitutional:  Negative for chills, fever, malaise/fatigue and weight loss.  Respiratory:  Positive for cough and sputum production. Negative for hemoptysis, shortness of breath and wheezing.   Cardiovascular:  Negative for chest pain.  Skin:  Negative for rash.     Objective:   Vitals:   04/01/23 1445  BP: 116/60  Pulse: 83  Temp: 97.6 F (36.4 C)  TempSrc: Temporal  SpO2: 99%  Weight: 164 lb 3.2 oz (74.5 kg)  Height: 5\' 3"  (1.6 m)   99% on RA BMI Readings from Last 3 Encounters:  04/01/23 29.09 kg/m  03/12/23 28.70 kg/m  03/02/23 28.87 kg/m   Wt Readings from Last 3 Encounters:   04/01/23 164 lb 3.2 oz (74.5 kg)  03/12/23 162 lb (73.5 kg)  03/02/23 163 lb (73.9 kg)    Physical Exam Constitutional:      Appearance: Normal appearance.  HENT:     Head: Normocephalic.  Cardiovascular:     Rate and Rhythm: Normal rate and regular rhythm.     Pulses: Normal pulses.     Heart sounds: Normal heart sounds.  Pulmonary:     Effort: Pulmonary effort is normal.     Breath sounds: Normal breath sounds. No wheezing or rales.  Abdominal:     Palpations: Abdomen is soft.  Musculoskeletal:     Right lower leg: No edema.     Left lower leg: No edema.  Neurological:     General: No focal deficit present.     Mental Status: She is alert and oriented to person, place, and time. Mental status is at baseline.       Ancillary Information    Past Medical History:  Diagnosis Date   Anal fissure 12/09/2016   Added automatically from request for surgery [id number]    Added automatically from request for surgery [id number]     Arthralgia of left knee 12/03/2020  Asthma 08/29/2022   Chronic/stable.    Follows with Dr. Westley Hummer, pulmonology.   Using Flovent and albuterol inhaler as needed.     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     History of total knee arthroplasty 08/21/2021   Hyperlipidemia 08/29/2022   Lab Results   Component Value Date    Cholesterol 197 01/28/2022      Lab Results   Component Value Date    HDL 56 01/28/2022      Lab Results   Component Value Date    LDL Calculated 115 01/28/2022      Lab Results   Component Value Date    Triglycerides 131 01/28/2022      Lab Results   Component Value Date    Chol/HDL Ratio 3.5 01/28/2022      Criteria used to  determine recommendation include:    Hypertension    Lung nodule    Major depressive disorder in full remission (HCC) 04/02/2020   Mood stable on venlafaxine 150 mg daily.     Migraine 08/29/2022   NAFLD (nonalcoholic fatty liver disease) 19/14/7829   Non-alcoholic cirrhosis (HCC)    Osteoarthritis of left knee 09/03/2020   Peritoneal hematoma 07/07/2019   Sprain of MCL (medial collateral ligament) of knee 09/03/2020   Symptom associated with female genital organs 07/09/2010     Family History  Problem Relation Age of Onset   Transient ischemic attack Mother    Thyroid disease Mother    Arthritis Mother    Hearing loss Mother    High blood pressure Mother    High Cholesterol Mother    Miscarriages / [place] Mother    Stroke Mother    Lung cancer Father    Arthritis Father    Asthma Father    COPD Father    Depression Father    Early death Father    Hearing loss Father    Heart disease Father    High Cholesterol Father    Arthritis Sister    Birth defects Sister    Cancer Sister    Diabetes Sister    High blood pressure Sister    Breast cancer Daughter 54   Diabetes Paternal Grandmother    Arthritis Paternal Grandfather    Cancer Paternal Grandfather      Past Surgical History:  Procedure Laterality Date   ABDOMINAL HYSTERECTOMY     BREAST BIOPSY Left 03/31/2023   [place] Core bx, coil clip - path pending   BREAST BIOPSY Left 03/31/2023   [place] LT BREAST BX W LOC DEV 1ST LESION IMG BX SPEC US GUIDE 03/31/2023 [facility]   CARDIAC CATHETERIZATION  03/10/2012   Negative   CESAREAN SECTION     CHOLECYSTECTOMY     TONSILLECTOMY      Social History   Socioeconomic History   Marital status: Legally Separated    Spouse name: Not on file   Number of children: 4   Years of education: Not on file   Highest education level: Not on file  Occupational History   Occupation: Retired  Tobacco Use   Smoking status: Former    Current packs/day: 0.00    Types:  Cigarettes    Quit date: 06/10/1983    Years since quitting: 39.8   Smokeless tobacco: Never  Vaping Use   Vaping status: Never Used  Substance and Sexual Activity   Alcohol use: No   Drug use: No   Sexual activity: Not on file  Other Topics Concern   Not on file  Social History Narrative   Not on file   Social Drivers of Health   Financial Resource Strain: Low Risk  (10/21/2022)   Received from [facility]   Overall Financial Resource Strain (CARDIA)    Difficulty of Paying Living Expenses: Not very hard  Food Insecurity: No Food Insecurity (10/21/2022)   Received from [facility]   Hunger Vital Sign    Worried About Running Out of Food in the Last Year: Never true    Ran Out of Food in the Last Year: Never true  Transportation Needs: No Transportation Needs (10/21/2022)   Received from [facility] - Transportation    Lack of Transportation (Medical): No    Lack of Transportation (Non-Medical): No  Physical Activity: Unknown (10/21/2022)   Received from [facility]   Exercise Vital Sign    Days of Exercise per Week: 0 days    Minutes of Exercise per Session: Not on file  Stress: No Stress Concern Present (10/21/2022)   Received from [facility] - Occupational Stress Questionnaire    Feeling of Stress : Only a little  Social Connections: Socially Integrated (10/21/2022)   Received from [facility]   Social Network    How would you rate your social network (family, work, friends)?: Good participation with social networks  Intimate Partner Violence: Not At Risk (10/21/2022)   Received from [facility]   HITS    Over the last 12 months how often did your partner physically hurt you?: Never    Over the last 12 months how often did your partner insult you or talk down to you?: Never    Over the last 12 months how often did your partner threaten you with physical harm?: Never    Over the last 12 months how often did  your partner scream or curse at you?: Never     Allergies  Allergen Reactions   Penicillins Rash    Other reaction(s): unknown reaction   Codeine Other (See Comments)    Irritable, insomnia   Zoloft [Sertraline Hcl] Other (See Comments)    Paradoxical response     CBC    Component Value Date/Time   WBC 8.4 06/10/2013 0710   RBC 3.71 (L) 06/10/2013 0710   HGB 10.8 (L) 06/10/2013 0710   HCT 33.2 (L) 06/10/2013 0710   PLT 167 06/10/2013 0710   MCV 89.5 06/10/2013 0710   MCH 29.1 06/10/2013 0710   MCHC 32.5 06/10/2013 0710   RDW 13.5 06/10/2013 0710   LYMPHSABS 2.5 06/08/2013 2020   MONOABS 0.9 06/08/2013 2020   EOSABS 0.3 06/08/2013 2020   BASOSABS 0.0 06/08/2013 2020    Pulmonary Functions Testing Results:     No data to display          Outpatient Medications Prior to Visit  Medication Sig Dispense Refill   albuterol (PROVENTIL HFA) 108 (90 BASE) MCG/ACT inhaler Inhale 1 puff into the lungs every 6 (six) hours as needed for wheezing or shortness of breath.     Calcium Carbonate-Vitamin D (OYSTER SHELL CALCIUM/D) 500-5 MG-MCG TABS Take 1 tablet by  mouth daily.     EFFEXOR XR 150 MG 24 hr capsule Take 150 mg by mouth daily with breakfast.     ezetimibe (ZETIA) 10 MG tablet Take 10 mg by mouth daily.     gabapentin (NEURONTIN) 100 MG capsule Take 1 capsule (100 mg total) by mouth 2 (two) times daily. 60 capsule 0   lisinopril-hydrochlorothiazide (PRINZIDE,ZESTORETIC) 10-12.5 MG per tablet Take 1 tablet by mouth daily.     loratadine (CLARITIN) 10 MG tablet Take 10 mg by mouth once as needed for allergies.     metFORMIN (GLUCOPHAGE) 500 MG tablet Take 1,000 mg by mouth 2 (two) times daily.     omeprazole (PRILOSEC) 20 MG capsule Take 1 capsule (20 mg total) by mouth daily. 90 capsule 0   pravastatin (PRAVACHOL) 80 MG tablet Take 80 mg by mouth daily.     fluticasone (FLOVENT HFA) 110 MCG/ACT inhaler Inhale 2 puffs into the lungs 2 (two) times daily. (Patient not taking:  Reported on 04/01/2023)     No facility-administered medications prior to visit.

## 2023-04-02 ENCOUNTER — Encounter: Payer: Self-pay | Admitting: *Deleted

## 2023-04-02 ENCOUNTER — Telehealth: Payer: Self-pay | Admitting: General Practice

## 2023-04-02 ENCOUNTER — Telehealth: Payer: Self-pay

## 2023-04-02 DIAGNOSIS — Z9289 Personal history of other medical treatment: Secondary | ICD-10-CM

## 2023-04-02 NOTE — Telephone Encounter (Signed)
Per secure chat from Dr. Aundria Rud- hey - can we please get this patient's chest CT ported via powershare from [place] in [place]. thank you   Request for images has been sent to [facility].

## 2023-04-02 NOTE — Progress Notes (Signed)
Received referral for newly diagnosed breast cancer from [facility].  Navigation initiated.  Attempted to call patient, no answer and unable to leave VM.  Will call again this afternoon

## 2023-04-02 NOTE — Progress Notes (Signed)
I spoke with Bianca Rogers and discussed surgeons and medical oncologists.  She is going to discuss with her daughter and call me in the morning to let me know who she would like to see.

## 2023-04-02 NOTE — Telephone Encounter (Signed)
I was able to get in touch with Randa Lynn, RN, the radiologist assistant, from [facility].   She states that she attempted to call the patient yesterday x 2 but was not able to get in touch with her.   After discussing the results with Bonita Quin, she will call the patient today and explain the next steps to the patient.   I called and discussed this with the patient and provider her with Linda's phone number (ok per Bonita Quin). Patient verbalizes understanding.   Modesto Charon, DNP, AGNP-C 04/02/2023 9:52 AM

## 2023-04-03 ENCOUNTER — Encounter: Payer: Self-pay | Admitting: *Deleted

## 2023-04-03 DIAGNOSIS — C50919 Malignant neoplasm of unspecified site of unspecified female breast: Secondary | ICD-10-CM

## 2023-04-03 NOTE — Progress Notes (Signed)
Bianca Rogers decided she would like to see Dr. Cathie Hoops and Dr. Claudine Mouton.   She will see Dr. Cathie Hoops on Wednesday 1/29 at 11:15.  Referral placed to  surgical for Dr. Claudine Mouton.

## 2023-04-07 ENCOUNTER — Telehealth: Payer: Self-pay | Admitting: Student in an Organized Health Care Education/Training Program

## 2023-04-07 NOTE — H&P (View-Only) (Signed)
Patient ID: Bianca Rogers, female   DOB: 1950/11/11, 73 y.o.   MRN: [id number]  Chief Complaint: Left breast cancer  History of Present Illness Bianca Rogers is a 73 y.o. female with abnormal imaging with 4-month interval follow-up showing increase in size of a lesion of concern in the outer lower quadrant left breast.  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  Ultrasound negative for left axillary lymphadenopathy. She has a family history including a daughter who has been diagnosed with triple negative breast cancer at the age of 74.  Other family members that have had cancer have been tested genetically, so far negative. She currently has a daughter a sister and a niece with breast cancer.  She began menstruating at the age of 69.  She took oral birth control.  She is gravida 4 para 4.  Her first pregnancy was at the age of 3, she did not breast-feed.  She has had a hysterectomy. She has not had a palpable mass, nor history of breast pain, but does complete monthly breast exams. She has an MRI to follow-up with the DCIS diagnosis.  And has Oncotype pending.  Past Medical History Past Medical History:  Diagnosis Date   Anal fissure 12/09/2016   Added automatically from request for surgery [id number]    Added automatically from request for surgery [id number]     Arthralgia of left knee 12/03/2020   Asthma 08/29/2022   Chronic/stable.    Follows with Dr. Westley Hummer, pulmonology.   Using Flovent and albuterol inhaler as needed.     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     History of total knee arthroplasty 08/21/2021    Hyperlipidemia 08/29/2022   Lab Results   Component Value Date    Cholesterol 197 01/28/2022      Lab Results   Component Value Date    HDL 56 01/28/2022      Lab Results   Component Value Date    LDL Calculated 115 01/28/2022      Lab Results   Component Value Date    Triglycerides 131 01/28/2022      Lab Results   Component Value Date    Chol/HDL Ratio 3.5 01/28/2022      Criteria used to determine recommendation include:    Hypertension    Lung nodule    Major depressive disorder in full remission (HCC) 04/02/2020   Mood stable on venlafaxine 150 mg daily.     Migraine 08/29/2022   NAFLD (nonalcoholic fatty liver disease) 62/13/0865   Non-alcoholic cirrhosis (HCC)    Osteoarthritis of left knee 09/03/2020   Peritoneal hematoma 07/07/2019   Sprain of MCL (medial collateral ligament) of knee 09/03/2020   Symptom associated with female genital organs 07/09/2010      Past Surgical History:  Procedure Laterality Date   ABDOMINAL HYSTERECTOMY     BREAST BIOPSY Left 03/31/2023   [place] Core bx, coil clip - path pending   BREAST BIOPSY Left 03/31/2023   [place] LT BREAST BX W LOC DEV 1ST LESION IMG BX SPEC US GUIDE 03/31/2023 [facility]   CARDIAC CATHETERIZATION  03/10/2012  Negative   CATARACT EXTRACTION Bilateral    CESAREAN SECTION     CHOLECYSTECTOMY     SPLENECTOMY     TONSILLECTOMY      Allergies  Allergen Reactions   Penicillins Rash    Other reaction(s): unknown reaction   Codeine Other (See Comments)    Irritable, insomnia   Zoloft [Sertraline Hcl] Other (See Comments)    Paradoxical response    Current Outpatient Medications  Medication Sig Dispense Refill   albuterol (PROVENTIL HFA) 108 (90 BASE) MCG/ACT inhaler Inhale 1 puff into the lungs every 6 (six) hours as needed for wheezing or shortness of breath.     Calcium Carbonate-Vitamin D (OYSTER SHELL CALCIUM/D) 500-5 MG-MCG TABS Take 1 tablet by mouth daily.     EFFEXOR XR 150 MG 24 hr capsule Take 150 mg by mouth  daily with breakfast.     ezetimibe (ZETIA) 10 MG tablet Take 10 mg by mouth daily.     gabapentin (NEURONTIN) 100 MG capsule Take 1 capsule (100 mg total) by mouth 2 (two) times daily. 60 capsule 0   lisinopril-hydrochlorothiazide (PRINZIDE,ZESTORETIC) 10-12.5 MG per tablet Take 1 tablet by mouth daily.     loratadine (CLARITIN) 10 MG tablet Take 10 mg by mouth once as needed for allergies.     metFORMIN (GLUCOPHAGE) 500 MG tablet Take 1,000 mg by mouth 2 (two) times daily.     omeprazole (PRILOSEC) 20 MG capsule Take 1 capsule (20 mg total) by mouth daily. 90 capsule 0   pravastatin (PRAVACHOL) 80 MG tablet Take 80 mg by mouth daily.     No current facility-administered medications for this visit.    Family History Family History  Problem Relation Age of Onset   Transient ischemic attack Mother    Thyroid disease Mother    Arthritis Mother    Hearing loss Mother    High blood pressure Mother    High Cholesterol Mother    Miscarriages / [place] Mother    Stroke Mother    Lung cancer Father    Arthritis Father    Asthma Father    COPD Father    Depression Father    Early death Father    Hearing loss Father    Heart disease Father    High Cholesterol Father    Breast cancer Sister    Arthritis Sister    Birth defects Sister    Cancer Sister        breast cancer   Diabetes Sister    High blood pressure Sister    Diabetes Paternal Grandmother    Arthritis Paternal Grandfather    Cancer Paternal Grandfather    Breast cancer Daughter 23   Cervical cancer Daughter    Breast cancer Niece       Social History Social History   Tobacco Use   Smoking status: Former    Current packs/day: 0.00    Average packs/day: 2.0 packs/day for 7.2 years (14.5 ttl pk-yrs)    Types: Cigarettes    Start date: 15    Quit date: 06/10/1983    Years since quitting: 39.8   Smokeless tobacco: Never  Vaping Use   Vaping status: Never Used  Substance Use Topics   Alcohol use: No    Drug use: No        Review of Systems  All other systems reviewed and are negative.    Physical Exam Blood pressure (!) 159/80, pulse 76, temperature 98.1 F (36.7 C), temperature source Oral,  height 5\' 4"  (1.626 m), weight 158 lb (71.7 kg), SpO2 96%. Last Weight  Most recent update: 04/09/2023  9:11 AM    Weight  71.7 kg (158 lb)             CONSTITUTIONAL: Well developed, and nourished, appropriately responsive and aware without distress.   EYES: Sclera non-icteric.   EARS, NOSE, MOUTH AND THROAT:  The oropharynx is clear. Oral mucosa is pink and moist.    Hearing is intact to voice.  NECK: Trachea is midline, and there is no jugular venous distension.  LYMPH NODES:  Lymph nodes in the neck are not appreciated. RESPIRATORY:  Lungs are clear, and breath sounds are equal bilaterally.  Normal respiratory effort without pathologic use of accessory muscles. CARDIOVASCULAR: Heart is regular in rate and rhythm.   Well perfused.  GI: The abdomen is  soft, nontender, and nondistended. There were no palpable masses.  GU: Marylene Land present as chaperone: No dominant, nor suspicious nodularity or masses in either breast.  No appreciable supraclavicular or axillary lymphadenopathy. MUSCULOSKELETAL:  Symmetrical muscle tone appreciated in all four extremities.    SKIN: Skin turgor is normal. No pathologic skin lesions appreciated.  NEUROLOGIC:  Motor and sensation appear grossly normal.  Cranial nerves are grossly without defect. PSYCH:  Alert and oriented to person, place and time. Affect is appropriate for situation.  Data Reviewed I have personally reviewed what is currently available of the patient's imaging, recent labs and medical records.   Labs:     Latest Ref Rng & Units 04/08/2023   12:14 PM 06/10/2013    7:10 AM 06/08/2013    8:20 PM  CBC  WBC 4.0 - 10.5 K/uL 9.8  8.4  10.0   Hemoglobin 12.0 - 15.0 g/dL 95.2  84.1  32.4   Hematocrit 36.0 - 46.0 % 39.3  33.2  38.5   Platelets 150  - 400 K/uL 396  167  205       Latest Ref Rng & Units 03/12/2023   11:44 AM 06/10/2013    7:10 AM 06/08/2013    8:20 PM  CMP  Glucose 70 - 99 mg/dL 401  027  253   BUN 6 - 23 mg/dL 21  20  13    Creatinine 0.40 - 1.20 mg/dL 6.64  4.03  4.74   Sodium 135 - 145 mEq/L 138  143  143   Potassium 3.5 - 5.1 mEq/L 4.4  4.0  4.0   Chloride 96 - 112 mEq/L 100  106  102   CO2 19 - 32 mEq/L 28  27  26    Calcium 8.4 - 10.5 mg/dL 9.8  9.2  9.8   Total Protein 6.0 - 8.3 g/dL 7.7  6.6  8.4   Total Bilirubin 0.2 - 1.2 mg/dL 0.4  0.3  0.6   Alkaline Phos 39 - 117 U/L 112  108  150   AST 0 - 37 U/L 45  32  53   ALT 0 - 35 U/L 35  47  60    REPORT OF SURGICAL PATHOLOGY  FINAL DIAGNOSIS       1. Breast, left, needle core biopsy, 5 o'clock, 4cmfn, coil clip :      - INVASIVE MAMMARY CARCINOMA, NO SPECIAL TYPE.      - TUBULE FORMATION: SCORE 3      - NUCLEAR PLEOMORPHISM: SCORE 3      - MITOTIC COUNT: SCORE 2      - TOTAL SCORE: 8      -  OVERALL GRADE: 3      - LYMPHOVASCULAR INVASION: NOT IDENTIFIED      - CANCER LENGTH: 8 MM      - CALCIFICATIONS: NOT IDENTIFIED      - DUCTAL CARCINOMA IN SITU: PRESENT, HIGH-GRADE    PROGNOSTIC INDICATORS  Results: IMMUNOHISTOCHEMICAL AND MORPHOMETRIC ANALYSIS PERFORMED MANUALLY The tumor cells are negative for Her2 (0). Estrogen Receptor:  100%, POSITIVE, STRONG STAINING INTENSITY Progesterone Receptor:  95%, POSITIVE, MODERATE STAINING INTENSITY   Imaging: Radiological images reviewed:  CLINICAL DATA:  73 year old female presents for six-month follow-up of LEFT breast asymmetry from outside facility.   EXAM: DIGITAL DIAGNOSTIC UNILATERAL LEFT MAMMOGRAM WITH TOMOSYNTHESIS AND CAD; ULTRASOUND LEFT BREAST LIMITED   TECHNIQUE: Left digital diagnostic mammography and breast tomosynthesis was performed. The images were evaluated with computer-aided detection. ; Targeted ultrasound examination of the left breast was performed.   COMPARISON:  09/12/2022  mammogram and ultrasound and prior studies from [facility].   ACR Breast Density Category c: The breasts are heterogeneously dense, which may obscure small masses.   FINDINGS: Full field views of the LEFT breast demonstrate a slightly enlarging asymmetry/mass within the posterior LEFT breast.   Targeted ultrasound is performed, showing a 0.8 x 0.5 x 0.7 cm irregular hypoechoic mass at the 5 o'clock position of the LEFT breast 4 cm from the nipple, likely representing the mammographic finding.   The small cystic changes within the INNER LEFT breast identified on the prior study are no longer visualized.   No abnormal LEFT axillary lymph nodes are noted.   IMPRESSION: 1. 0.8 cm suspicious mass within the LOWER OUTER LEFT breast, likely representing the enlarging mammographic finding. Tissue sampling is recommended. No abnormal appearing LEFT axillary lymph nodes.   RECOMMENDATION: Ultrasound-guided LEFT breast biopsy, which will be arranged. Recommended attention to post biopsy clip mammograms to ensure that the sonographic mass biopsied represents the mammographic abnormality.   I have discussed the findings and recommendations with the patient. If applicable, a reminder letter will be sent to the patient regarding the next appointment.   BI-RADS CATEGORY  4: Suspicious.     Electronically Signed   By: Harmon Pier M.D.   On: 03/26/2023 10:38  ADDENDUM REPORT: 04/02/2023 22:44   ADDENDUM: PATHOLOGY revealed: 1. Breast, left, needle core biopsy, 5 o'clock, 4 cmfn, coil clip : - INVASIVE MAMMARY CARCINOMA, NO SPECIAL TYPE. - OVERALL GRADE: 3 - LYMPHOVASCULAR INVASION: NOT IDENTIFIED - CANCER LENGTH: 8 MM - CALCIFICATIONS: NOT IDENTIFIED - DUCTAL CARCINOMA IN SITU: PRESENT, HIGH-GRADE.   Pathology results are CONCORDANT with imaging findings, per Dr. Meda Klinefelter.   Pathology results and recommendations were discussed with  patient via telephone on 04/02/2023 by Randa Lynn RN. Patient reported biopsy site doing well with no adverse symptoms, and only slight tenderness at the site. Post biopsy care instructions were reviewed, questions were answered and my direct phone number was provided. Patient was instructed to call [facility] for any additional questions or concerns related to biopsy site.   RECOMMENDATIONS: 1. Surgical and oncological consultation. Request for surgical and oncological consultation relayed to Irving Shows RN at [facility] by Randa Lynn RN on 04/02/2023.   2. Recommend pretreatment bilateral breast MRI with and without contrast to determine extent of breast disease given diagnosis of high grade DCIS, and breast density.   Pathology results reported by Randa Lynn RN on 04/02/2023.     Electronically Signed   By: Judeth Cornfield  Peacock M.D.   On: 04/02/2023 22:44 Within last 24 hrs: No results found.  Assessment     Patient Active Problem List   Diagnosis Date Noted   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 breast of female, estrogen receptor positive (HCC) 04/09/2023   Invasive carcinoma of breast (HCC) 04/08/2023   Family history of breast cancer 04/08/2023   Mass of left breast 03/12/2023   Gastroesophageal reflux disease 03/02/2023   Liver cirrhosis secondary to NASH (HCC) 03/02/2023   Bronchiectasis (HCC) 06/09/2013   Type 2 diabetes mellitus with diabetic neuropathy (HCC) 06/09/2013   HTN (hypertension), benign 06/09/2013    Plan    Will follow-up MRI, assuming no upstaging or upgrading of suspicious lesions, or extension of disease beyond what we already appreciate, we will proceed with Scout tag localized left breast lumpectomy with sentinel lymph node biopsy.  I discussed the available options with the patient. The risk of recurrence is similar between mastectomy and lumpectomy with radiation.  I also discussed that given the small  size of the cancer would recommend localization lumpectomy with radiation to follow.  I also discussed that we may not need to do a sentinel lymph node, however they desired to proceed with biopsy to check the nodes.   Explained to the patient that after her surgical treatment additional treatment will depend on her prognostic indicators and stage.    I discussed risks of bleeding, infection, damage to surrounding tissues, having positive margins, needing further resection, damage to nerves causing arm numbness or difficulty raising arm, causing lymphedema in the arm; as well as anesthesia risks of MI, stroke, prolonged ventilation, pulmonary embolism, thrombosis and even death.   Patient was given the opportunity to ask questions and have them answered.  They would like to proceed with left breast Scout tag localized lumpectomy with sentinel lymph node biopsy.   Face-to-face time spent with the patient and accompanying care providers(if present) was 40 minutes, spent counseling, educating, and coordinating care of the patient.    These notes generated with voice recognition software. I apologize for typographical errors.  Campbell Lerner M.D., FACS 04/09/2023, 12:15 PM

## 2023-04-07 NOTE — Progress Notes (Unsigned)
Patient ID: Bianca Rogers, female   DOB: 04/25/1950, 73 y.o.   MRN: [id number]  Chief Complaint: Left breast cancer  History of Present Illness Bianca Rogers is a 73 y.o. female with abnormal imaging with 56-month interval follow-up showing increase in size of a lesion of concern in the outer lower quadrant left breast.  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  Ultrasound negative for left axillary lymphadenopathy. She has a family history including a daughter who has been diagnosed with triple negative breast cancer at the age of 80.  Other family members that have had cancer have been tested genetically, so far negative. She currently has a daughter a sister and a niece with breast cancer.  She began menstruating at the age of 33.  She took oral birth control.  She is gravida 4 para 4.  Her first pregnancy was at the age of 61, she did not breast-feed.  She has had a hysterectomy. She has not had a palpable mass, nor history of breast pain, but does complete monthly breast exams. She has an MRI to follow-up with the DCIS diagnosis.  And has Oncotype pending.  Past Medical History Past Medical History:  Diagnosis Date   Anal fissure 12/09/2016   Added automatically from request for surgery [id number]    Added automatically from request for surgery [id number]     Arthralgia of left knee 12/03/2020   Asthma 08/29/2022   Chronic/stable.    Follows with Dr. Westley Hummer, pulmonology.   Using Flovent and albuterol inhaler as needed.     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     History of total knee arthroplasty 08/21/2021    Hyperlipidemia 08/29/2022   Lab Results   Component Value Date    Cholesterol 197 01/28/2022      Lab Results   Component Value Date    HDL 56 01/28/2022      Lab Results   Component Value Date    LDL Calculated 115 01/28/2022      Lab Results   Component Value Date    Triglycerides 131 01/28/2022      Lab Results   Component Value Date    Chol/HDL Ratio 3.5 01/28/2022      Criteria used to determine recommendation include:    Hypertension    Lung nodule    Major depressive disorder in full remission (HCC) 04/02/2020   Mood stable on venlafaxine 150 mg daily.     Migraine 08/29/2022   NAFLD (nonalcoholic fatty liver disease) 13/24/4010   Non-alcoholic cirrhosis (HCC)    Osteoarthritis of left knee 09/03/2020   Peritoneal hematoma 07/07/2019   Sprain of MCL (medial collateral ligament) of knee 09/03/2020   Symptom associated with female genital organs 07/09/2010      Past Surgical History:  Procedure Laterality Date   ABDOMINAL HYSTERECTOMY     BREAST BIOPSY Left 03/31/2023   [place] Core bx, coil clip - path pending   BREAST BIOPSY Left 03/31/2023   [place] LT BREAST BX W LOC DEV 1ST LESION IMG BX SPEC US GUIDE 03/31/2023 [facility]   CARDIAC CATHETERIZATION  03/10/2012  Negative   CATARACT EXTRACTION Bilateral    CESAREAN SECTION     CHOLECYSTECTOMY     SPLENECTOMY     TONSILLECTOMY      Allergies  Allergen Reactions   Penicillins Rash    Other reaction(s): unknown reaction   Codeine Other (See Comments)    Irritable, insomnia   Zoloft [Sertraline Hcl] Other (See Comments)    Paradoxical response    Current Outpatient Medications  Medication Sig Dispense Refill   albuterol (PROVENTIL HFA) 108 (90 BASE) MCG/ACT inhaler Inhale 1 puff into the lungs every 6 (six) hours as needed for wheezing or shortness of breath.     Calcium Carbonate-Vitamin D (OYSTER SHELL CALCIUM/D) 500-5 MG-MCG TABS Take 1 tablet by mouth daily.     EFFEXOR XR 150 MG 24 hr capsule Take 150 mg by mouth  daily with breakfast.     ezetimibe (ZETIA) 10 MG tablet Take 10 mg by mouth daily.     gabapentin (NEURONTIN) 100 MG capsule Take 1 capsule (100 mg total) by mouth 2 (two) times daily. 60 capsule 0   lisinopril-hydrochlorothiazide (PRINZIDE,ZESTORETIC) 10-12.5 MG per tablet Take 1 tablet by mouth daily.     loratadine (CLARITIN) 10 MG tablet Take 10 mg by mouth once as needed for allergies.     metFORMIN (GLUCOPHAGE) 500 MG tablet Take 1,000 mg by mouth 2 (two) times daily.     omeprazole (PRILOSEC) 20 MG capsule Take 1 capsule (20 mg total) by mouth daily. 90 capsule 0   pravastatin (PRAVACHOL) 80 MG tablet Take 80 mg by mouth daily.     No current facility-administered medications for this visit.    Family History Family History  Problem Relation Age of Onset   Transient ischemic attack Mother    Thyroid disease Mother    Arthritis Mother    Hearing loss Mother    High blood pressure Mother    High Cholesterol Mother    Miscarriages / [place] Mother    Stroke Mother    Lung cancer Father    Arthritis Father    Asthma Father    COPD Father    Depression Father    Early death Father    Hearing loss Father    Heart disease Father    High Cholesterol Father    Breast cancer Sister    Arthritis Sister    Birth defects Sister    Cancer Sister        breast cancer   Diabetes Sister    High blood pressure Sister    Diabetes Paternal Grandmother    Arthritis Paternal Grandfather    Cancer Paternal Grandfather    Breast cancer Daughter 36   Cervical cancer Daughter    Breast cancer Niece       Social History Social History   Tobacco Use   Smoking status: Former    Current packs/day: 0.00    Average packs/day: 2.0 packs/day for 7.2 years (14.5 ttl pk-yrs)    Types: Cigarettes    Start date: 68    Quit date: 06/10/1983    Years since quitting: 39.8   Smokeless tobacco: Never  Vaping Use   Vaping status: Never Used  Substance Use Topics   Alcohol use: No    Drug use: No        Review of Systems  All other systems reviewed and are negative.    Physical Exam Blood pressure (!) 159/80, pulse 76, temperature 98.1 F (36.7 C), temperature source Oral,  height 5\' 4"  (1.626 m), weight 158 lb (71.7 kg), SpO2 96%. Last Weight  Most recent update: 04/09/2023  9:11 AM    Weight  71.7 kg (158 lb)             CONSTITUTIONAL: Well developed, and nourished, appropriately responsive and aware without distress.   EYES: Sclera non-icteric.   EARS, NOSE, MOUTH AND THROAT:  The oropharynx is clear. Oral mucosa is pink and moist.    Hearing is intact to voice.  NECK: Trachea is midline, and there is no jugular venous distension.  LYMPH NODES:  Lymph nodes in the neck are not appreciated. RESPIRATORY:  Lungs are clear, and breath sounds are equal bilaterally.  Normal respiratory effort without pathologic use of accessory muscles. CARDIOVASCULAR: Heart is regular in rate and rhythm.   Well perfused.  GI: The abdomen is  soft, nontender, and nondistended. There were no palpable masses.  GU: Marylene Land present as chaperone: No dominant, nor suspicious nodularity or masses in either breast.  No appreciable supraclavicular or axillary lymphadenopathy. MUSCULOSKELETAL:  Symmetrical muscle tone appreciated in all four extremities.    SKIN: Skin turgor is normal. No pathologic skin lesions appreciated.  NEUROLOGIC:  Motor and sensation appear grossly normal.  Cranial nerves are grossly without defect. PSYCH:  Alert and oriented to person, place and time. Affect is appropriate for situation.  Data Reviewed I have personally reviewed what is currently available of the patient's imaging, recent labs and medical records.   Labs:     Latest Ref Rng & Units 04/08/2023   12:14 PM 06/10/2013    7:10 AM 06/08/2013    8:20 PM  CBC  WBC 4.0 - 10.5 K/uL 9.8  8.4  10.0   Hemoglobin 12.0 - 15.0 g/dL 91.4  78.2  95.6   Hematocrit 36.0 - 46.0 % 39.3  33.2  38.5   Platelets 150  - 400 K/uL 396  167  205       Latest Ref Rng & Units 03/12/2023   11:44 AM 06/10/2013    7:10 AM 06/08/2013    8:20 PM  CMP  Glucose 70 - 99 mg/dL 213  086  578   BUN 6 - 23 mg/dL 21  20  13    Creatinine 0.40 - 1.20 mg/dL 4.69  6.29  5.28   Sodium 135 - 145 mEq/L 138  143  143   Potassium 3.5 - 5.1 mEq/L 4.4  4.0  4.0   Chloride 96 - 112 mEq/L 100  106  102   CO2 19 - 32 mEq/L 28  27  26    Calcium 8.4 - 10.5 mg/dL 9.8  9.2  9.8   Total Protein 6.0 - 8.3 g/dL 7.7  6.6  8.4   Total Bilirubin 0.2 - 1.2 mg/dL 0.4  0.3  0.6   Alkaline Phos 39 - 117 U/L 112  108  150   AST 0 - 37 U/L 45  32  53   ALT 0 - 35 U/L 35  47  60    REPORT OF SURGICAL PATHOLOGY  FINAL DIAGNOSIS       1. Breast, left, needle core biopsy, 5 o'clock, 4cmfn, coil clip :      - INVASIVE MAMMARY CARCINOMA, NO SPECIAL TYPE.      - TUBULE FORMATION: SCORE 3      - NUCLEAR PLEOMORPHISM: SCORE 3      - MITOTIC COUNT: SCORE 2      - TOTAL SCORE: 8      -  OVERALL GRADE: 3      - LYMPHOVASCULAR INVASION: NOT IDENTIFIED      - CANCER LENGTH: 8 MM      - CALCIFICATIONS: NOT IDENTIFIED      - DUCTAL CARCINOMA IN SITU: PRESENT, HIGH-GRADE    PROGNOSTIC INDICATORS  Results: IMMUNOHISTOCHEMICAL AND MORPHOMETRIC ANALYSIS PERFORMED MANUALLY The tumor cells are negative for Her2 (0). Estrogen Receptor:  100%, POSITIVE, STRONG STAINING INTENSITY Progesterone Receptor:  95%, POSITIVE, MODERATE STAINING INTENSITY   Imaging: Radiological images reviewed:  CLINICAL DATA:  73 year old female presents for six-month follow-up of LEFT breast asymmetry from outside facility.   EXAM: DIGITAL DIAGNOSTIC UNILATERAL LEFT MAMMOGRAM WITH TOMOSYNTHESIS AND CAD; ULTRASOUND LEFT BREAST LIMITED   TECHNIQUE: Left digital diagnostic mammography and breast tomosynthesis was performed. The images were evaluated with computer-aided detection. ; Targeted ultrasound examination of the left breast was performed.   COMPARISON:  09/12/2022  mammogram and ultrasound and prior studies from [facility].   ACR Breast Density Category c: The breasts are heterogeneously dense, which may obscure small masses.   FINDINGS: Full field views of the LEFT breast demonstrate a slightly enlarging asymmetry/mass within the posterior LEFT breast.   Targeted ultrasound is performed, showing a 0.8 x 0.5 x 0.7 cm irregular hypoechoic mass at the 5 o'clock position of the LEFT breast 4 cm from the nipple, likely representing the mammographic finding.   The small cystic changes within the INNER LEFT breast identified on the prior study are no longer visualized.   No abnormal LEFT axillary lymph nodes are noted.   IMPRESSION: 1. 0.8 cm suspicious mass within the LOWER OUTER LEFT breast, likely representing the enlarging mammographic finding. Tissue sampling is recommended. No abnormal appearing LEFT axillary lymph nodes.   RECOMMENDATION: Ultrasound-guided LEFT breast biopsy, which will be arranged. Recommended attention to post biopsy clip mammograms to ensure that the sonographic mass biopsied represents the mammographic abnormality.   I have discussed the findings and recommendations with the patient. If applicable, a reminder letter will be sent to the patient regarding the next appointment.   BI-RADS CATEGORY  4: Suspicious.     Electronically Signed   By: Harmon Pier M.D.   On: 03/26/2023 10:38  ADDENDUM REPORT: 04/02/2023 22:44   ADDENDUM: PATHOLOGY revealed: 1. Breast, left, needle core biopsy, 5 o'clock, 4 cmfn, coil clip : - INVASIVE MAMMARY CARCINOMA, NO SPECIAL TYPE. - OVERALL GRADE: 3 - LYMPHOVASCULAR INVASION: NOT IDENTIFIED - CANCER LENGTH: 8 MM - CALCIFICATIONS: NOT IDENTIFIED - DUCTAL CARCINOMA IN SITU: PRESENT, HIGH-GRADE.   Pathology results are CONCORDANT with imaging findings, per Dr. Meda Klinefelter.   Pathology results and recommendations were discussed with  patient via telephone on 04/02/2023 by Randa Lynn RN. Patient reported biopsy site doing well with no adverse symptoms, and only slight tenderness at the site. Post biopsy care instructions were reviewed, questions were answered and my direct phone number was provided. Patient was instructed to call [facility] for any additional questions or concerns related to biopsy site.   RECOMMENDATIONS: 1. Surgical and oncological consultation. Request for surgical and oncological consultation relayed to Irving Shows RN at [facility] by Randa Lynn RN on 04/02/2023.   2. Recommend pretreatment bilateral breast MRI with and without contrast to determine extent of breast disease given diagnosis of high grade DCIS, and breast density.   Pathology results reported by Randa Lynn RN on 04/02/2023.     Electronically Signed   By: Judeth Cornfield  Peacock M.D.   On: 04/02/2023 22:44 Within last 24 hrs: No results found.  Assessment     Patient Active Problem List   Diagnosis Date Noted   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 breast of female, estrogen receptor positive (HCC) 04/09/2023   Invasive carcinoma of breast (HCC) 04/08/2023   Family history of breast cancer 04/08/2023   Mass of left breast 03/12/2023   Gastroesophageal reflux disease 03/02/2023   Liver cirrhosis secondary to NASH (HCC) 03/02/2023   Bronchiectasis (HCC) 06/09/2013   Type 2 diabetes mellitus with diabetic neuropathy (HCC) 06/09/2013   HTN (hypertension), benign 06/09/2013    Plan    Will follow-up MRI, assuming no upstaging or upgrading of suspicious lesions, or extension of disease beyond what we already appreciate, we will proceed with Scout tag localized left breast lumpectomy with sentinel lymph node biopsy.  I discussed the available options with the patient. The risk of recurrence is similar between mastectomy and lumpectomy with radiation.  I also discussed that given the small  size of the cancer would recommend localization lumpectomy with radiation to follow.  I also discussed that we may not need to do a sentinel lymph node, however they desired to proceed with biopsy to check the nodes.   Explained to the patient that after her surgical treatment additional treatment will depend on her prognostic indicators and stage.    I discussed risks of bleeding, infection, damage to surrounding tissues, having positive margins, needing further resection, damage to nerves causing arm numbness or difficulty raising arm, causing lymphedema in the arm; as well as anesthesia risks of MI, stroke, prolonged ventilation, pulmonary embolism, thrombosis and even death.   Patient was given the opportunity to ask questions and have them answered.  They would like to proceed with left breast Scout tag localized lumpectomy with sentinel lymph node biopsy.   Face-to-face time spent with the patient and accompanying care providers(if present) was 40 minutes, spent counseling, educating, and coordinating care of the patient.    These notes generated with voice recognition software. I apologize for typographical errors.  Campbell Lerner M.D., FACS 04/09/2023, 12:15 PM

## 2023-04-08 ENCOUNTER — Encounter: Payer: Self-pay | Admitting: *Deleted

## 2023-04-08 ENCOUNTER — Other Ambulatory Visit (HOSPITAL_COMMUNITY): Payer: Self-pay

## 2023-04-08 ENCOUNTER — Inpatient Hospital Stay: Payer: Medicare HMO

## 2023-04-08 ENCOUNTER — Inpatient Hospital Stay: Payer: Medicare HMO | Attending: Oncology | Admitting: Oncology

## 2023-04-08 ENCOUNTER — Encounter: Payer: Self-pay | Admitting: Oncology

## 2023-04-08 VITALS — BP 108/71 | HR 91 | Temp 98.8°F | Resp 18 | Wt 161.1 lb

## 2023-04-08 DIAGNOSIS — Z170 Estrogen receptor positive status [ER+]: Secondary | ICD-10-CM | POA: Insufficient documentation

## 2023-04-08 DIAGNOSIS — Z87891 Personal history of nicotine dependence: Secondary | ICD-10-CM | POA: Insufficient documentation

## 2023-04-08 DIAGNOSIS — C50919 Malignant neoplasm of unspecified site of unspecified female breast: Secondary | ICD-10-CM

## 2023-04-08 DIAGNOSIS — Z1721 Progesterone receptor positive status: Secondary | ICD-10-CM | POA: Insufficient documentation

## 2023-04-08 DIAGNOSIS — C50912 Malignant neoplasm of unspecified site of left female breast: Secondary | ICD-10-CM | POA: Diagnosis present

## 2023-04-08 DIAGNOSIS — Z1732 Human epidermal growth factor receptor 2 negative status: Secondary | ICD-10-CM | POA: Insufficient documentation

## 2023-04-08 DIAGNOSIS — Z803 Family history of malignant neoplasm of breast: Secondary | ICD-10-CM | POA: Insufficient documentation

## 2023-04-08 LAB — CBC WITH DIFFERENTIAL (CANCER CENTER ONLY)
Abs Immature Granulocytes: 0.04 10*3/uL (ref 0.00–0.07)
Basophils Absolute: 0.1 10*3/uL (ref 0.0–0.1)
Basophils Relative: 1 %
Eosinophils Absolute: 0.4 10*3/uL (ref 0.0–0.5)
Eosinophils Relative: 4 %
HCT: 39.3 % (ref 36.0–46.0)
Hemoglobin: 12.8 g/dL (ref 12.0–15.0)
Immature Granulocytes: 0 %
Lymphocytes Relative: 43 %
Lymphs Abs: 4.3 10*3/uL — ABNORMAL HIGH (ref 0.7–4.0)
MCH: 29.5 pg (ref 26.0–34.0)
MCHC: 32.6 g/dL (ref 30.0–36.0)
MCV: 90.6 fL (ref 80.0–100.0)
Monocytes Absolute: 1.2 10*3/uL — ABNORMAL HIGH (ref 0.1–1.0)
Monocytes Relative: 13 %
Neutro Abs: 3.8 10*3/uL (ref 1.7–7.7)
Neutrophils Relative %: 39 %
Platelet Count: 396 10*3/uL (ref 150–400)
RBC: 4.34 MIL/uL (ref 3.87–5.11)
RDW: 13.7 % (ref 11.5–15.5)
WBC Count: 9.8 10*3/uL (ref 4.0–10.5)
nRBC: 0 % (ref 0.0–0.2)

## 2023-04-08 NOTE — Progress Notes (Signed)
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 After reviewing the pathology in detail, we proceeded to discuss the different treatment options between surgery, radiation, chemotherapy, antiestrogen therapies.  Treatment plan: 1.  Obtain MRI breast with and without contrast for further evaluation of extent of high-grade DCIS. 2  If feasible, breast conserving surgery with sentinel lymph node biopsy 3. Oncotype DX testing on the surgical specimen to determine if she would benefit from adjuvant chemo 4.  Adjuvant radiation 5.  Adjuvant antiestrogen therapy  Return to clinic based upon surgery pathology and Oncotype DX test result   Family history of breast cancer Refer to genetic counseling   Orders Placed This Encounter  Procedures   MR BREAST BILATERAL W WO CONTRAST INC CAD    Standing Status:   Future    Expected Date:   04/15/2023    Expiration Date:   04/07/2024    If indicated for the ordered procedure, I authorize the administration of contrast media per Radiology protocol:   Yes    What is the patient's sedation requirement?:   No Sedation    Does the patient have a pacemaker or implanted devices?:   No    Radiology Contrast Protocol - do NOT remove file path:   [email or web address]    Preferred imaging location?:   [facility] (table limit - 550lbs)   CBC with Differential (Cancer Center Only)    Standing Status:    Future    Number of Occurrences:   1    Expected Date:   04/08/2023    Expiration Date:   04/07/2024   Cancer antigen 15-3    Standing Status:   Future    Number of Occurrences:   1    Expected Date:   04/08/2023    Expiration Date:   04/07/2024   Cancer antigen 27.29    Standing Status:   Future    Number of Occurrences:   1    Expected Date:   04/08/2023    Expiration Date:   04/07/2024   Ambulatory referral to Genetics    Referral Priority:   Routine    Referral Type:   Consultation    Referral Reason:   Specialty Services Required    Number of Visits Requested:   1   Follow-up to be determined All questions were answered. The patient knows to call the clinic with any problems, questions or concerns.  Rickard Patience, MD, PhD [facility] Hematology Oncology 04/08/2023    HISTORY OF PRESENTING ILLNESS:  Bianca Rogers 73 y.o. female presents to establish care for left breast invasive carcinoma I have reviewed her chart and materials related to her cancer extensively and collaborated history with the patient. Summary of oncologic history is as follows: Oncology History  Invasive carcinoma of breast (HCC)  03/26/2023 Mammogram   Diagnostic unilateral left breast mammogram showed 1. 0.8 cm suspicious mass within the LOWER OUTER LEFT breast, likely representing the enlarging mammographic finding. Tissue sampling is recommended. No abnormal appearing LEFT axillary lymph nodes.   04/08/2023  Initial Diagnosis   Invasive carcinoma of breast [facility])  Patient presented for left breast diagnostic mammogram for follow-up of left breast asymmetry from outside facility.  She denies any breast concerns.  Left breast 5:00 4 cm from nipple needle core biopsy showed - INVASIVE MAMMARY CARCINOMA, NO SPECIAL TYPE.       - TUBULE FORMATION: SCORE 3       - NUCLEAR PLEOMORPHISM: SCORE 3       - MITOTIC COUNT: SCORE 2       - TOTAL SCORE: 8       - OVERALL GRADE: 3       - LYMPHOVASCULAR INVASION: NOT  IDENTIFIED       - CANCER LENGTH: 8 MM       - CALCIFICATIONS: NOT IDENTIFIED       - DUCTAL CARCINOMA IN SITU: PRESENT, HIGH-GRADE   ER 100%, PR 95%, HER2 negative [0]   Menarche at age of 8 or 31. First live birth at age of 63 OCP use: Less than 5 years of use. History of hysterectomy: Yes with removal of 1 ovary. Menopausal status: Postmenopausal History of HRT use: Denies History of chest radiation: Denies Number of previous breast biopsies: Denies Family history positive for multiple family members with breast cancer.     04/08/2023 Cancer Staging   Staging form: Breast, AJCC 8th Edition - Clinical stage from 04/08/2023: Stage IA (cT1b, cN0, cM0, G3, ER+, PR+, HER2-) - Signed by Rickard Patience, MD on 04/08/2023 Stage prefix: Initial diagnosis Histologic grading system: 3 grade system     MEDICAL HISTORY:  Past Medical History:  Diagnosis Date   Anal fissure 12/09/2016   Added automatically from request for surgery [id number]    Added automatically from request for surgery [id number]     Arthralgia of left knee 12/03/2020   Asthma 08/29/2022   Chronic/stable.    Follows with Dr. Westley Hummer, pulmonology.   Using Flovent and albuterol inhaler as needed.     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     History of total knee arthroplasty 08/21/2021   Hyperlipidemia 08/29/2022   Lab Results   Component Value Date    Cholesterol 197 01/28/2022      Lab Results   Component Value Date    HDL 56 01/28/2022      Lab Results   Component Value Date    LDL Calculated 115 01/28/2022      Lab Results   Component Value Date    Triglycerides 131 01/28/2022      Lab Results   Component Value Date     Chol/HDL Ratio 3.5 01/28/2022      Criteria used to determine recommendation include:    Hypertension    Lung nodule    Major depressive disorder in full remission (HCC) 04/02/2020   Mood stable on venlafaxine 150 mg daily.     Migraine 08/29/2022   NAFLD (nonalcoholic fatty liver disease) 19/14/7829   Non-alcoholic cirrhosis (HCC)    Osteoarthritis of left knee 09/03/2020   Peritoneal hematoma 07/07/2019   Sprain of MCL (medial collateral ligament) of knee 09/03/2020   Symptom associated with female genital organs 07/09/2010  SURGICAL HISTORY: Past Surgical History:  Procedure Laterality Date   ABDOMINAL HYSTERECTOMY     BREAST BIOPSY Left 03/31/2023   [place] Core bx, coil clip - path pending   BREAST BIOPSY Left 03/31/2023   [place] LT BREAST BX W LOC DEV 1ST LESION IMG BX SPEC US GUIDE 03/31/2023 [facility]   CARDIAC CATHETERIZATION  03/10/2012   Negative   CATARACT EXTRACTION Bilateral    CESAREAN SECTION     CHOLECYSTECTOMY     SPLENECTOMY     TONSILLECTOMY      SOCIAL HISTORY: Social History   Socioeconomic History   Marital status: Legally Separated    Spouse name: Not on file   Number of children: 4   Years of education: Not on file   Highest education level: Not on file  Occupational History   Occupation: Retired  Tobacco Use   Smoking status: Former    Current packs/day: 0.00    Average packs/day: 2.0 packs/day for 7.2 years (14.5 ttl pk-yrs)    Types: Cigarettes    Start date: 19    Quit date: 06/10/1983    Years since quitting: 39.8   Smokeless tobacco: Never  Vaping Use   Vaping status: Never Used  Substance and Sexual Activity   Alcohol use: No   Drug use: No   Sexual activity: Not on file  Other Topics Concern   Not on file  Social History Narrative   Not on file   Social Drivers of Health   Financial Resource Strain: Low Risk  (10/21/2022)   Received from [facility]   Overall Financial Resource Strain (CARDIA)    Difficulty of  Paying Living Expenses: Not very hard  Food Insecurity: No Food Insecurity (04/08/2023)   Hunger Vital Sign    Worried About Running Out of Food in the Last Year: Never true    Ran Out of Food in the Last Year: Never true  Transportation Needs: No Transportation Needs (04/08/2023)   PRAPARE - Administrator, Civil Service (Medical): No    Lack of Transportation (Non-Medical): No  Physical Activity: Unknown (10/21/2022)   Received from [facility]   Exercise Vital Sign    Days of Exercise per Week: 0 days    Minutes of Exercise per Session: Not on file  Stress: No Stress Concern Present (10/21/2022)   Received from [facility] - Occupational Stress Questionnaire    Feeling of Stress : Only a little  Social Connections: Socially Integrated (10/21/2022)   Received from [facility]   Social Network    How would you rate your social network (family, work, friends)?: Good participation with social networks  Intimate Partner Violence: Not At Risk (04/08/2023)   Humiliation, Afraid, Rape, and Kick questionnaire    Fear of Current or Ex-Partner: No    Emotionally Abused: No    Physically Abused: No    Sexually Abused: No    FAMILY HISTORY: Family History  Problem Relation Age of Onset   Transient ischemic attack Mother    Thyroid disease Mother    Arthritis Mother    Hearing loss Mother    High blood pressure Mother    High Cholesterol Mother    Miscarriages / [place] Mother    Stroke Mother    Lung cancer Father    Arthritis Father    Asthma Father    COPD Father    Depression Father    Early death Father  Hearing loss Father    Heart disease Father    High Cholesterol Father    Breast cancer Sister    Arthritis Sister    Birth defects Sister    Cancer Sister        breast cancer   Diabetes Sister    High blood pressure Sister    Diabetes Paternal Grandmother    Arthritis Paternal Grandfather    Cancer  Paternal Grandfather    Breast cancer Daughter 5   Cervical cancer Daughter    Breast cancer Niece     ALLERGIES:  is allergic to penicillins, codeine, and zoloft [sertraline hcl].  MEDICATIONS:  Current Outpatient Medications  Medication Sig Dispense Refill   albuterol (PROVENTIL HFA) 108 (90 BASE) MCG/ACT inhaler Inhale 1 puff into the lungs every 6 (six) hours as needed for wheezing or shortness of breath.     Calcium Carbonate-Vitamin D (OYSTER SHELL CALCIUM/D) 500-5 MG-MCG TABS Take 1 tablet by mouth daily.     EFFEXOR XR 150 MG 24 hr capsule Take 150 mg by mouth daily with breakfast.     ezetimibe (ZETIA) 10 MG tablet Take 10 mg by mouth daily.     gabapentin (NEURONTIN) 100 MG capsule Take 1 capsule (100 mg total) by mouth 2 (two) times daily. 60 capsule 0   lisinopril-hydrochlorothiazide (PRINZIDE,ZESTORETIC) 10-12.5 MG per tablet Take 1 tablet by mouth daily.     metFORMIN (GLUCOPHAGE) 500 MG tablet Take 1,000 mg by mouth 2 (two) times daily.     omeprazole (PRILOSEC) 20 MG capsule Take 1 capsule (20 mg total) by mouth daily. 90 capsule 0   pravastatin (PRAVACHOL) 80 MG tablet Take 80 mg by mouth daily.     loratadine (CLARITIN) 10 MG tablet Take 10 mg by mouth once as needed for allergies. (Patient not taking: Reported on 04/08/2023)     No current facility-administered medications for this visit.    Review of Systems  Constitutional:  Negative for appetite change, chills, fatigue and fever.  HENT:   Negative for hearing loss and voice change.   Eyes:  Negative for eye problems.  Respiratory:  Negative for chest tightness and cough.   Cardiovascular:  Negative for chest pain.  Gastrointestinal:  Negative for abdominal distention, abdominal pain and blood in stool.  Endocrine: Negative for hot flashes.  Genitourinary:  Negative for difficulty urinating and frequency.   Musculoskeletal:  Negative for arthralgias.  Skin:  Negative for itching and rash.  Neurological:   Negative for extremity weakness.  Hematological:  Negative for adenopathy.  Psychiatric/Behavioral:  Negative for confusion.      PHYSICAL EXAMINATION: ECOG PERFORMANCE STATUS: 1 - Symptomatic but completely ambulatory  Vitals:   04/08/23 1119  BP: 108/71  Pulse: 91  Resp: 18  Temp: 98.8 F (37.1 C)   Filed Weights   04/08/23 1119  Weight: 161 lb 1.6 oz (73.1 kg)    Physical Exam Constitutional:      General: She is not in acute distress.    Appearance: She is not diaphoretic.  HENT:     Head: Normocephalic and atraumatic.     Nose: Nose normal.  Eyes:     General: No scleral icterus. Cardiovascular:     Rate and Rhythm: Normal rate and regular rhythm.     Heart sounds: No murmur heard. Pulmonary:     Effort: Pulmonary effort is normal. No respiratory distress.  Abdominal:     General: There is no distension.     Palpations:  Abdomen is soft.     Tenderness: There is no abdominal tenderness.  Musculoskeletal:        General: Normal range of motion.     Cervical back: Normal range of motion and neck supple.  Skin:    General: Skin is warm and dry.     Findings: No erythema.  Neurological:     Mental Status: She is alert and oriented to person, place, and time.     Cranial Nerves: No cranial nerve deficit.     Motor: No abnormal muscle tone.     Coordination: Coordination normal.  Psychiatric:        Mood and Affect: Affect normal.     Breast exam was performed in seated and lying down position. Patient is status post left breast biopsy with focal tenderness/tissue swelling/bruising.  She also has right breast and axillary tenderness.  No mass was felt.Marland Kitchen  No palpable axillary adenopathy bilaterally.   LABORATORY DATA:  I have reviewed the data as listed    Latest Ref Rng & Units 04/08/2023   12:14 PM 06/10/2013    7:10 AM 06/08/2013    8:20 PM  CBC  WBC 4.0 - 10.5 K/uL 9.8  8.4  10.0   Hemoglobin 12.0 - 15.0 g/dL 16.1  09.6  04.5   Hematocrit 36.0 - 46.0 %  39.3  33.2  38.5   Platelets 150 - 400 K/uL 396  167  205       Latest Ref Rng & Units 03/12/2023   11:44 AM 06/10/2013    7:10 AM 06/08/2013    8:20 PM  CMP  Glucose 70 - 99 mg/dL 409  811  914   BUN 6 - 23 mg/dL 21  20  13    Creatinine 0.40 - 1.20 mg/dL 7.82  9.56  2.13   Sodium 135 - 145 mEq/L 138  143  143   Potassium 3.5 - 5.1 mEq/L 4.4  4.0  4.0   Chloride 96 - 112 mEq/L 100  106  102   CO2 19 - 32 mEq/L 28  27  26    Calcium 8.4 - 10.5 mg/dL 9.8  9.2  9.8   Total Protein 6.0 - 8.3 g/dL 7.7  6.6  8.4   Total Bilirubin 0.2 - 1.2 mg/dL 0.4  0.3  0.6   Alkaline Phos 39 - 117 U/L 112  108  150   AST 0 - 37 U/L 45  32  53   ALT 0 - 35 U/L 35  47  60      RADIOGRAPHIC STUDIES: I have personally reviewed the radiological images as listed and agreed with the findings in the report. [place] LT BREAST BX W LOC DEV 1ST LESION IMG BX SPEC US GUIDE Addendum Date: 04/03/2023 ADDENDUM REPORT: 04/02/2023 22:44 ADDENDUM: PATHOLOGY revealed: 1. Breast, left, needle core biopsy, 5 o'clock, 4 cmfn, coil clip : - INVASIVE MAMMARY CARCINOMA, NO SPECIAL TYPE. - OVERALL GRADE: 3 - LYMPHOVASCULAR INVASION: NOT IDENTIFIED - CANCER LENGTH: 8 MM - CALCIFICATIONS: NOT IDENTIFIED - DUCTAL CARCINOMA IN SITU: PRESENT, HIGH-GRADE. Pathology results are CONCORDANT with imaging findings, per Dr. Meda Klinefelter. Pathology results and recommendations were discussed with patient via telephone on 04/02/2023 by Randa Lynn RN. Patient reported biopsy site doing well with no adverse symptoms, and only slight tenderness at the site. Post biopsy care instructions were reviewed, questions were answered and my direct phone number was provided. Patient was instructed to call [facility] for any additional  questions or concerns related to biopsy site. RECOMMENDATIONS: 1. Surgical and oncological consultation. Request for surgical and oncological consultation relayed to Irving Shows RN at [facility] by Randa Lynn RN on 04/02/2023. 2. Recommend pretreatment bilateral breast MRI with and without contrast to determine extent of breast disease given diagnosis of high grade DCIS, and breast density. Pathology results reported by Randa Lynn RN on 04/02/2023. Electronically Signed   By: Meda Klinefelter M.D.   On: 04/02/2023 22:44   Result Date: 04/02/2023 CLINICAL DATA:  Indeterminate breast mass EXAM: ULTRASOUND GUIDED LEFT BREAST CORE NEEDLE BIOPSY COMPARISON:  Previous exam(s). PROCEDURE: I met with the patient and we discussed the procedure of ultrasound-guided biopsy, including benefits and alternatives. We discussed the high likelihood of a successful procedure. We discussed the risks of the procedure, including infection, bleeding, tissue injury, clip migration, and inadequate sampling. Informed written consent was given. The usual time-out protocol was performed immediately prior to the procedure. Lesion quadrant: Lower outer quadrant Using sterile technique and 1% lidocaine and 1% lidocaine with epinephrine as local anesthetic, under direct ultrasound visualization, a 14 gauge spring-loaded device was used to perform biopsy of a mass at 5 o'clock 4 cm from the nipple using a lateral approach. At the conclusion of the procedure a COIL shaped tissue marker clip was deployed into the biopsy cavity. Follow up 2 view mammogram was performed and dictated separately. IMPRESSION: Ultrasound guided biopsy of a LEFT breast mass at 5 o'clock. No apparent complications. Electronically Signed: By: Meda Klinefelter M.D. On: 03/31/2023 13:09   MM CLIP PLACEMENT LEFT Result Date: 03/31/2023 CLINICAL DATA:  Status post ultrasound-guided biopsy EXAM: 3D DIAGNOSTIC LEFT MAMMOGRAM POST ULTRASOUND BIOPSY COMPARISON:  Previous exam(s). FINDINGS: 3D Mammographic images were obtained following ultrasound guided biopsy of mass at 5 o'clock. The COIL biopsy marking clip is in expected position at the site of biopsy. This is at the site  of mammographic concern. IMPRESSION: Appropriate positioning of the COIL shaped biopsy marking clip at the site of biopsy in the lower breast at posterior depth. Final Assessment: Post Procedure Mammograms for Marker Placement Electronically Signed   By: Meda Klinefelter M.D.   On: 03/31/2023 09:36   MM 3D DIAGNOSTIC MAMMOGRAM UNILATERAL LEFT BREAST Result Date: 03/26/2023 CLINICAL DATA:  73 year old female presents for six-month follow-up of LEFT breast asymmetry from outside facility. EXAM: DIGITAL DIAGNOSTIC UNILATERAL LEFT MAMMOGRAM WITH TOMOSYNTHESIS AND CAD; ULTRASOUND LEFT BREAST LIMITED TECHNIQUE: Left digital diagnostic mammography and breast tomosynthesis was performed. The images were evaluated with computer-aided detection. ; Targeted ultrasound examination of the left breast was performed. COMPARISON:  09/12/2022 mammogram and ultrasound and prior studies from [facility]. ACR Breast Density Category c: The breasts are heterogeneously dense, which may obscure small masses. FINDINGS: Full field views of the LEFT breast demonstrate a slightly enlarging asymmetry/mass within the posterior LEFT breast. Targeted ultrasound is performed, showing a 0.8 x 0.5 x 0.7 cm irregular hypoechoic mass at the 5 o'clock position of the LEFT breast 4 cm from the nipple, likely representing the mammographic finding. The small cystic changes within the INNER LEFT breast identified on the prior study are no longer visualized. No abnormal LEFT axillary lymph nodes are noted. IMPRESSION: 1. 0.8 cm suspicious mass within the LOWER OUTER LEFT breast, likely representing the enlarging mammographic finding. Tissue sampling is recommended. No abnormal appearing LEFT axillary lymph nodes. RECOMMENDATION: Ultrasound-guided LEFT breast biopsy, which will be arranged. Recommended attention to post biopsy  clip mammograms to ensure that the sonographic mass biopsied represents the mammographic  abnormality. I have discussed the findings and recommendations with the patient. If applicable, a reminder letter will be sent to the patient regarding the next appointment. BI-RADS CATEGORY  4: Suspicious. Electronically Signed   By: Harmon Pier M.D.   On: 03/26/2023 10:38   [place] LIMITED ULTRASOUND INCLUDING AXILLA LEFT BREAST  Result Date: 03/26/2023 CLINICAL DATA:  73 year old female presents for six-month follow-up of LEFT breast asymmetry from outside facility. EXAM: DIGITAL DIAGNOSTIC UNILATERAL LEFT MAMMOGRAM WITH TOMOSYNTHESIS AND CAD; ULTRASOUND LEFT BREAST LIMITED TECHNIQUE: Left digital diagnostic mammography and breast tomosynthesis was performed. The images were evaluated with computer-aided detection. ; Targeted ultrasound examination of the left breast was performed. COMPARISON:  09/12/2022 mammogram and ultrasound and prior studies from [facility]. ACR Breast Density Category c: The breasts are heterogeneously dense, which may obscure small masses. FINDINGS: Full field views of the LEFT breast demonstrate a slightly enlarging asymmetry/mass within the posterior LEFT breast. Targeted ultrasound is performed, showing a 0.8 x 0.5 x 0.7 cm irregular hypoechoic mass at the 5 o'clock position of the LEFT breast 4 cm from the nipple, likely representing the mammographic finding. The small cystic changes within the INNER LEFT breast identified on the prior study are no longer visualized. No abnormal LEFT axillary lymph nodes are noted. IMPRESSION: 1. 0.8 cm suspicious mass within the LOWER OUTER LEFT breast, likely representing the enlarging mammographic finding. Tissue sampling is recommended. No abnormal appearing LEFT axillary lymph nodes. RECOMMENDATION: Ultrasound-guided LEFT breast biopsy, which will be arranged. Recommended attention to post biopsy clip mammograms to ensure that the sonographic mass biopsied represents the mammographic abnormality. I have  discussed the findings and recommendations with the patient. If applicable, a reminder letter will be sent to the patient regarding the next appointment. BI-RADS CATEGORY  4: Suspicious. Electronically Signed   By: Harmon Pier M.D.   On: 03/26/2023 10:38   MM Outside Films Mammo Result Date: 03/19/2023 This examination belongs to an outside facility and is stored here for comparison purposes only.  Contact the originating outside institution for any associated report or interpretation.  MM Outside Films Mammo Result Date: 03/19/2023 This examination belongs to an outside facility and is stored here for comparison purposes only.  Contact the originating outside institution for any associated report or interpretation.  MM Outside Films Mammo Result Date: 03/19/2023 This examination belongs to an outside facility and is stored here for comparison purposes only.  Contact the originating outside institution for any associated report or interpretation.  MM Outside Films Mammo Result Date: 03/19/2023 This examination belongs to an outside facility and is stored here for comparison purposes only.  Contact the originating outside institution for any associated report or interpretation.  MM Outside Films Mammo Result Date: 03/19/2023 This examination belongs to an outside facility and is stored here for comparison purposes only.  Contact the originating outside institution for any associated report or interpretation.

## 2023-04-08 NOTE — Assessment & Plan Note (Signed)
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 After reviewing the pathology in detail, we proceeded to discuss the different treatment options between surgery, radiation, chemotherapy, antiestrogen therapies.  Treatment plan: 1.  Obtain MRI breast with and without contrast for further evaluation of extent of high-grade DCIS. 2  If feasible, breast conserving surgery with sentinel lymph node biopsy 3. Oncotype DX testing on the surgical specimen to determine if she would benefit from adjuvant chemo 4.  Adjuvant radiation 5.  Adjuvant antiestrogen therapy  Return to clinic based upon surgery pathology and Oncotype DX test result

## 2023-04-08 NOTE — Progress Notes (Unsigned)
Accompanied patient and family to initial medical oncology appointment.   Reviewed Breast Cancer treatment handbook.   Care plan summary given to patient.   Reviewed outreach programs and cancer center services.

## 2023-04-08 NOTE — Assessment & Plan Note (Signed)
Refer to genetic counseling.

## 2023-04-08 NOTE — Telephone Encounter (Signed)
Patient currently has the Advair filled at their pharmacy which is preventing me from checking on how alternatives might be covered. Due to the beginning of the year patient may also have a deductible to meet which would contribute to the high cost.

## 2023-04-09 ENCOUNTER — Other Ambulatory Visit: Payer: Self-pay

## 2023-04-09 ENCOUNTER — Other Ambulatory Visit: Payer: Self-pay | Admitting: Surgery

## 2023-04-09 ENCOUNTER — Encounter: Payer: Self-pay | Admitting: Surgery

## 2023-04-09 ENCOUNTER — Encounter: Payer: Self-pay | Admitting: General Practice

## 2023-04-09 ENCOUNTER — Ambulatory Visit: Payer: Self-pay | Admitting: Surgery

## 2023-04-09 ENCOUNTER — Encounter: Payer: Self-pay | Admitting: *Deleted

## 2023-04-09 VITALS — BP 159/80 | HR 76 | Temp 98.1°F | Ht 64.0 in | Wt 158.0 lb

## 2023-04-09 DIAGNOSIS — Z170 Estrogen receptor positive status [ER+]: Secondary | ICD-10-CM | POA: Diagnosis not present

## 2023-04-09 DIAGNOSIS — C50919 Malignant neoplasm of unspecified site of unspecified female breast: Secondary | ICD-10-CM

## 2023-04-09 DIAGNOSIS — C50512 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 female breast: Secondary | ICD-10-CM | POA: Diagnosis not present

## 2023-04-09 LAB — CANCER ANTIGEN 27.29: CA 27.29: 43.2 U/mL — ABNORMAL HIGH (ref 0.0–38.6)

## 2023-04-09 LAB — CANCER ANTIGEN 15-3: CA 15-3: 28.4 U/mL — ABNORMAL HIGH (ref 0.0–25.0)

## 2023-04-09 NOTE — Patient Instructions (Signed)
We have spoken today about removing a lump in your breast. This will be done by Dr. Claudine Mouton at [facility].  You will most likely be able to leave the hospital several hours after your surgery. Rarely, a patient needs to stay over night but this is a possibility.  Plan to tentatively be off work for 1-2 weeks following the surgery and may return with approximately 2 more weeks of a lifting restriction, no greater than 15 lbs.  Please see your Blue surgery sheet for more information. Our surgery scheduler will call you to look at surgery dates and to go over information.   If you have FMLA or Disability paperwork that needs to be filled out, please have your company fax your paperwork to [phone] or you may drop this by either office. This paperwork will be filled out within 3 days after your surgery has been completed.    Lumpectomy A lumpectomy is a form of "breast conserving" or "breast preservation" surgery. It may also be referred to as a partial mastectomy. During a lumpectomy, the portion of the breast that contains the cancerous tumor or breast mass (the lump) is removed. Some normal tissue around the lump may also be removed to make sure all of the tumor has been removed.  LET [facility] CARE PROVIDER KNOW ABOUT: Any allergies you have. All medicines you are taking, including vitamins, herbs, eye drops, creams, and over-the-counter medicines. Previous problems you or members of your family have had with the use of anesthetics. Any blood disorders you have. Previous surgeries you have had. Medical conditions you have. RISKS AND COMPLICATIONS Generally, this is a safe procedure. However, problems can occur and include: Bleeding. Infection. Pain. Temporary swelling. Change in the shape of the breast, particularly if a large portion is removed. BEFORE THE PROCEDURE Ask your health care provider about changing or stopping your regular medicines. This is especially important if you  are taking diabetes medicines or blood thinners. Do not eat or drink anything after midnight on the night before the procedure or as directed by your health care provider. Ask your health care provider if you can take a sip of water with any approved medicines. On the day of surgery, your health care provider will use a mammogram or ultrasound to locate and mark the tumor in your breast. These markings on your breast will show where the cut (incision) will be made. PROCEDURE  An IV tube will be put into one of your veins. You may be given medicine to help you relax before the surgery (sedative). You will be given one of the following: A medicine that numbs the area (local anesthetic). A medicine that makes you fall asleep (general anesthetic). Your health care provider will use a kind of electric scalpel that uses heat to minimize bleeding (electrocautery knife). A curved incision (like a smile or frown) that follows the natural curve of your breast is made, to allow for minimal scarring and better healing. The tumor will be removed with some of the surrounding tissue. This will be sent to the lab for analysis. Your health care provider may also remove your lymph nodes at this time if needed. Sometimes, but not always, a rubber tube called a drain will be surgically inserted into your breast area or armpit to collect excess fluid that may accumulate in the space where the tumor was. This drain is connected to a plastic bulb on the outside of your body. This drain creates suction to help remove  the fluid. The incisions will be closed with stitches (sutures). A bandage may be placed over the incisions. AFTER THE PROCEDURE You will be taken to the recovery area. You will be given medicine for pain. A small rubber drain may be placed in the breast for 2-3 days to prevent a collection of blood (hematoma) from developing in the breast. You will be given instructions on caring for the drain before you go  home. A pressure bandage (dressing) will be applied for 1-2 days to prevent bleeding. Ask your health care provider how to care for your bandage at home.   This information is not intended to replace advice given to you by your health care provider. Make sure you discuss any questions you have with your health care provider.   Document Released: 04/07/2006 Document Revised: 03/17/2014 Document Reviewed: 07/30/2012 Elsevier Interactive Patient Education [employer]

## 2023-04-09 NOTE — Telephone Encounter (Signed)
Patient advised to call [facility] to cancel the prescription. She reports she can not pay $391 for the Advair. She did mention that it is for a 3  month supply. And for one month its almost $300. She will call [employer] to ask for an affordable inhaler.

## 2023-04-09 NOTE — Progress Notes (Signed)
Lumpectomy is scheduled for 2/12.  She will see Dr. Cathie Hoops back on 3/5, appt. Details given to her.

## 2023-04-10 ENCOUNTER — Telehealth: Payer: Self-pay | Admitting: Surgery

## 2023-04-10 NOTE — Telephone Encounter (Signed)
Unable to leave a message.  If patient calls, please inform her of the following regarding scheduled surgery with Dr. Claudine Mouton.   Pre-Admission date/time, and Surgery date at [facility].  Surgery Date: 04/22/23 arrive @ 7:30 am as will be having SLN bx injection done first before surgery.    Preadmission Testing Date: 04/15/23 (phone 8a-1p)  Also remind patient of breast tag placement at [facility] 04/20/23.

## 2023-04-13 ENCOUNTER — Encounter: Payer: Self-pay | Admitting: *Deleted

## 2023-04-13 ENCOUNTER — Ambulatory Visit
Admission: RE | Admit: 2023-04-13 | Discharge: 2023-04-13 | Disposition: A | Discharge status: Home / Self Care | Payer: Medicare HMO | Source: Ambulatory Visit | Attending: Oncology | Admitting: Oncology

## 2023-04-13 DIAGNOSIS — C50919 Malignant neoplasm of unspecified site of unspecified female breast: Secondary | ICD-10-CM | POA: Insufficient documentation

## 2023-04-13 MED ORDER — GADOBUTROL 1 MMOL/ML IV SOLN
7.0000 mL | Freq: Once | INTRAVENOUS | Status: AC | PRN
  Administered 2023-04-13: 7 mL via INTRAVENOUS

## 2023-04-13 NOTE — Progress Notes (Signed)
Genetic counseling appt. Set up for 2/10 mychart virtual visit.   She will have her labs drawn in [place] at 3:00 the same day.   Appt. Details given to her.

## 2023-04-13 NOTE — Telephone Encounter (Signed)
Tried calling patient again, several times no answer, not able to leave a message.

## 2023-04-13 NOTE — Telephone Encounter (Signed)
Called patient again, she is now informed of all dates regarding her surgery.   Patient encouraged to keep her phone close by for when preadmission calls on 04/15/23.

## 2023-04-15 ENCOUNTER — Encounter
Admission: RE | Admit: 2023-04-15 | Discharge: 2023-04-15 | Disposition: A | Discharge status: Home / Self Care | Payer: Medicare HMO | Source: Ambulatory Visit | Attending: Surgery | Admitting: Surgery

## 2023-04-15 ENCOUNTER — Other Ambulatory Visit: Payer: Self-pay

## 2023-04-15 ENCOUNTER — Ambulatory Visit: Payer: Medicare HMO | Attending: Oncology | Admitting: Occupational Therapy

## 2023-04-15 ENCOUNTER — Encounter: Payer: Self-pay | Admitting: Occupational Therapy

## 2023-04-15 VITALS — Ht 64.0 in | Wt 158.0 lb

## 2023-04-15 DIAGNOSIS — R293 Abnormal posture: Secondary | ICD-10-CM | POA: Insufficient documentation

## 2023-04-15 DIAGNOSIS — I10 Essential (primary) hypertension: Secondary | ICD-10-CM

## 2023-04-15 DIAGNOSIS — Z01812 Encounter for preprocedural laboratory examination: Secondary | ICD-10-CM

## 2023-04-15 DIAGNOSIS — E1140 Type 2 diabetes mellitus with diabetic neuropathy, unspecified: Secondary | ICD-10-CM

## 2023-04-15 HISTORY — DX: Cardiac murmur, unspecified: R01.1

## 2023-04-15 HISTORY — DX: 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 female breast: C50.512

## 2023-04-15 HISTORY — DX: Malignant neoplasm of unspecified site of unspecified female breast: C50.919

## 2023-04-15 HISTORY — DX: Gastro-esophageal reflux disease without esophagitis: K21.9

## 2023-04-15 HISTORY — DX: Unspecified cirrhosis of liver: K74.60

## 2023-04-15 HISTORY — DX: Estrogen receptor positive status (ER+): Z17.0

## 2023-04-15 HISTORY — DX: Nonalcoholic steatohepatitis (NASH): K75.81

## 2023-04-15 NOTE — Addendum Note (Signed)
 Addended by: Heloise Lobo on: 04/15/2023 05:55 PM   Modules accepted: Orders

## 2023-04-15 NOTE — Patient Instructions (Addendum)
 Your procedure is scheduled on:  Wednesday, February 12  Report to the Registration Desk on the 1st floor of the [facility] To find out your arrival time, please call [phone] between 1PM - 3PM on:  Tuesday , February 11  If your arrival time is 6:00 am, do not arrive before that time as the [facility] entrance doors do not open until 6:00 am.  REMEMBER: Instructions that are not followed completely may result in serious medical risk, up to and including death; or upon the discretion of your surgeon and anesthesiologist your surgery may need to be rescheduled.  Do not eat food after midnight the night before surgery.  No gum chewing or hard candies.  One week prior to surgery: Starting Wednesday , February 5  Stop Anti-inflammatories (NSAIDS) such as Advil , Aleve, Ibuprofen , Motrin , Naproxen, Naprosyn and Aspirin  based products such as Excedrin, Goody's Powder, BC Powder. Stop ANY OVER THE COUNTER supplements until after surgery. Calcium Carbonate-Vitamin D  loratadine  (CLARITIN )   You may however, continue to take Tylenol  if needed for pain up until the day of surgery.  **Follow guidelines for insulin  and diabetes medications.** metFORMIN  (GLUCOPHAGE -XR) hold 2 days prior to surgery, last dose Sunday February 9   Continue taking all of your other prescription medications up until the day of surgery.  ON THE DAY OF SURGERY ONLY TAKE THESE MEDICATIONS WITH SIPS OF WATER :  omeprazole  (PRILOSEC)   Use inhalers on the day of surgery and bring to the hospital. albuterol  (PROVENTIL  HFA)    No Alcohol for 24 hours before or after surgery.  No Smoking including e-cigarettes for 24 hours before surgery.  No chewable tobacco products for at least 6 hours before surgery.  No nicotine patches on the day of surgery.  Do not use any recreational drugs for at least a week (preferably 2 weeks) before your surgery.  Please be advised that the combination of cocaine and anesthesia  may have negative outcomes, up to and including death. If you test positive for cocaine, your surgery will be cancelled.  On the morning of surgery brush your teeth with toothpaste and water , you may rinse your mouth with mouthwash if you wish. Do not swallow any toothpaste or mouthwash.  Use CHG Soap as directed on instruction sheet.  Do not wear jewelry, make-up, hairpins, clips or nail polish.  For welded (permanent) jewelry: bracelets, anklets, waist bands, etc.  Please have this removed prior to surgery.  If it is not removed, there is a chance that hospital personnel will need to cut it off on the day of surgery.  Do not wear lotions, powders, or perfumes.   Do not shave body hair from the neck down 48 hours before surgery.  Contact lenses, hearing aids and dentures may not be worn into surgery.  Do not bring valuables to the hospital. [facility] is not responsible for any missing/lost belongings or valuables.   Notify your doctor if there is any change in your medical condition (cold, fever, infection).  Wear comfortable clothing (specific to your surgery type) to the hospital.  After surgery, you can help prevent lung complications by doing breathing exercises.  Take deep breaths and cough every 1-2 hours.   If you are being discharged the day of surgery, you will not be allowed to drive home. You will need a responsible individual to drive you home and stay with you for 24 hours after surgery.   If you are taking public transportation, you will  need to have a responsible individual with you.  Please call the Pre-admissions Testing Dept. at [phone] if you have any questions about these instructions.  Surgery Visitation Policy:  Patients having surgery or a procedure may have two visitors.  Children under the age of 37 must have an adult with them who is not the patient.  Temporary Visitor Restrictions Due to increasing cases of flu, RSV and COVID-19: Children  ages 22 and under will not be able to visit patients in [facility] hospitals under most circumstances.         Preparing for Surgery with CHLORHEXIDINE  GLUCONATE (CHG) Soap  Chlorhexidine  Gluconate (CHG) Soap  o An antiseptic cleaner that kills germs and bonds with the skin to continue killing germs even after washing  o Used for showering the night before surgery and morning of surgery  Before surgery, you can play an important role by reducing the number of germs on your skin.  CHG (Chlorhexidine  gluconate) soap is an antiseptic cleanser which kills germs and bonds with the skin to continue killing germs even after washing.  Please do not use if you have an allergy to CHG or antibacterial soaps. If your skin becomes reddened/irritated stop using the CHG.  1. Shower the NIGHT BEFORE SURGERY and the MORNING OF SURGERY with CHG soap.  2. If you choose to wash your hair, wash your hair first as usual with your normal shampoo.  3. After shampooing, rinse your hair and body thoroughly to remove the shampoo.  4. Use CHG as you would any other liquid soap. You can apply CHG directly to the skin and wash gently with a scrungie or a clean washcloth.  5. Apply the CHG soap to your body only from the neck down. Do not use on open wounds or open sores. Avoid contact with your eyes, ears, mouth, and genitals (private parts). Wash face and genitals (private parts) with your normal soap.  6. Wash thoroughly, paying special attention to the area where your surgery will be performed.  7. Thoroughly rinse your body with warm water .  8. Do not shower/wash with your normal soap after using and rinsing off the CHG soap.  9. Pat yourself dry with a clean towel.  10. Wear clean pajamas to bed the night before surgery.  12. Place clean sheets on your bed the night of your first shower and do not sleep with pets.  13. Shower again with the CHG soap on the day of surgery prior to arriving at the  hospital.  14. Do not apply any deodorants/lotions/powders.  15. Please wear clean clothes to the hospital.

## 2023-04-15 NOTE — Therapy (Addendum)
 OUTPATIENT OCCUPATIONAL THERAPY BREAST CANCER BASELINE EVALUATION   Patient Name: Bianca Rogers MRN: [id number] DOB:04-21-1950, 73 y.o., female Today's Date: 04/15/2023  END OF SESSION:  OT End of Session - 04/15/23 1737     Visit Number 1    Number of Visits 5    Date for OT Re-Evaluation 06/10/23    OT Start Time 1430    OT Stop Time 1500    OT Time Calculation (min) 30 min    Activity Tolerance Patient tolerated treatment well    Behavior During Therapy [facility] for tasks assessed/performed             Past Medical History:  Diagnosis Date   Anal fissure 12/09/2016   Added automatically from request for surgery [id number]    Added automatically from request for surgery [id number]     Arthralgia of left knee 12/03/2020   Asthma 08/29/2022   Chronic/stable.    Follows with Dr. Elayne, pulmonology.   Using Flovent  and albuterol  inhaler as needed.     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     Heart murmur    History of total knee arthroplasty 08/21/2021   Hyperlipidemia 08/29/2022   Lab Results   Component Value Date    Cholesterol 197 01/28/2022      Lab Results   Component Value Date    HDL 56 01/28/2022      Lab Results   Component Value Date    LDL Calculated 115 01/28/2022      Lab Results   Component Value Date    Triglycerides 131 01/28/2022      Lab Results   Component Value Date    Chol/HDL Ratio 3.5 01/28/2022      Criteria used to determine recommendation include:    Hypertension    Invasive carcinoma of breast (HCC)    Liver cirrhosis secondary to NASH (HCC)    Lung nodule    Major depressive disorder in full  remission (HCC) 04/02/2020   Mood stable on venlafaxine  150 mg daily.     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 breast of female, estrogen receptor positive (HCC)    Migraine 08/29/2022   NAFLD (nonalcoholic fatty liver disease) 92/76/7985   Non-alcoholic cirrhosis (HCC)    Osteoarthritis of left knee 09/03/2020   Peritoneal hematoma 07/07/2019   Sprain of MCL (medial collateral ligament) of knee 09/03/2020   Symptom associated with female genital organs 07/09/2010   Past Surgical History:  Procedure Laterality Date   ABDOMINAL HYSTERECTOMY     BREAST BIOPSY Left 03/31/2023   Us  Core bx, coil clip - path pending   BREAST BIOPSY Left 03/31/2023   US  LT BREAST BX W LOC DEV 1ST LESION IMG BX SPEC US  GUIDE 03/31/2023 [facility]   CARDIAC CATHETERIZATION  03/10/2012   Negative   CATARACT EXTRACTION Bilateral    CESAREAN SECTION     CHOLECYSTECTOMY     SPLENECTOMY     TONSILLECTOMY     Patient Active Problem List   Diagnosis Date Noted   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 breast of female, estrogen receptor positive (HCC)  04/09/2023   Invasive carcinoma of breast (HCC) 04/08/2023   Family history of breast cancer 04/08/2023   Gastroesophageal reflux disease 03/02/2023   Liver cirrhosis secondary to NASH (HCC) 03/02/2023   Bronchiectasis (HCC) 06/09/2013   Type 2 diabetes mellitus with diabetic neuropathy (HCC) 06/09/2013   HTN (hypertension), benign 06/09/2013    PCP: Dr Vincente MART PROVIDER: Dr Babara  REFERRING DIAG: L breast Cancer  THERAPY DIAG:  Abnormal posture  Rationale for Evaluation and Treatment: Rehabilitation  ONSET DATE: 04/08/23  SUBJECTIVE:                                                                                                                                                                                           SUBJECTIVE STATEMENT: Patient reports she is here today after being refer by one of her medical  team for her newly diagnosed L breast cancer.  Patient reports she had some problems with disks in her neck that cause her in the past pain in the R shoulder and arm  PERTINENT HISTORY:  Patient was diagnosed with L  breast cancer - plan is to have L Lumpectomy on 04/22/23 by Dr Lane.   PATIENT GOALS:   reduce lymphedema risk and learn post op HEP.   PAIN:  Are you having pain? No  PRECAUTIONS: Active CA      HAND DOMINANCE: right  WEIGHT BEARING RESTRICTIONS: No  FALLS:  Has patient fallen in last 6 months? 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, scientist, physiological and driving  LEISURE: Read, watch TV   OBJECTIVE:  COGNITION: Overall cognitive status: Within functional limits for tasks assessed    POSTURE:  Forward head and rounded shoulders posture -had in the past cervical issues with pain radiating in right shoulder and arm  UPPER EXTREMITY AROM/PROM:  A/PROM RIGHT   eval   Shoulder extension   Shoulder flexion 160  Shoulder abduction `160  Shoulder internal rotation 80  Shoulder external rotation     (Blank rows = not tested)  A/PROM LEFT   eval  Shoulder extension   Shoulder flexion 170  Shoulder abduction 170  Shoulder internal rotation 80  Shoulder external rotation     (Blank rows = not tested)  CERVICAL AROM: NT -denies any issues at this time   UPPER EXTREMITY STRENGTH: 5-/5 in bilateral shoulders  LYMPHEDEMA ASSESSMENTS:   LANDMARK RIGHT   eval  10 cm proximal to olecranon process   Olecranon process   10 cm proximal to ulnar styloid process  Just proximal to ulnar styloid process   Across hand at thumb web space   At base of 2nd digit   (Blank rows = not tested)  LANDMARK LEFT   eval  10 cm proximal to olecranon process   Olecranon process   10 cm proximal to ulnar styloid process   Just proximal to ulnar styloid process    Across hand at thumb web space   At base of 2nd digit   (Blank rows = not tested)  L-DEX LYMPHEDEMA SCREENING:  The patient was assessed using the L-Dex machine today to produce a lymphedema index baseline score. The patient will be reassessed on a regular basis (typically every 3 months) to obtain new L-Dex scores. If the score is > 6.5 points away from his/her baseline score indicating onset of subclinical lymphedema, it will be recommended to wear a compression garment for 4 weeks, 12 hours per day and then be reassessed. If the score continues to be > 6.5 points from baseline at reassessment, we will initiate lymphedema treatment. Assessing in this manner has a 95% rate of preventing clinically significant lymphedema.   L-DEX FLOWSHEETS - 04/15/23 1700       L-DEX LYMPHEDEMA SCREENING   Measurement Type Unilateral    L-DEX MEASUREMENT EXTREMITY Upper Extremity    POSITION  Standing    DOMINANT SIDE Right    At Risk Side Left   BASELINE SCORE (UNILATERAL) 0.7              PATIENT EDUCATION:  Education details: Lymphedema risk reduction and post op shoulder/posture HEP Person educated: Patient Education method: Explanation, Demonstration, Handout Education comprehension: Patient verbalized understanding and returned demonstration  HOME EXERCISE PROGRAM: Patient was instructed today in a home exercise program  for post op shoulder range of motion. These included active assist shoulder flexion and external rotation in supine, scapular retraction and standing, wall walking/slides with shoulder abduction, -keep hep 90 degrees until okayed for overhead by surgeon -she was encouraged to do these 2-3 x day, holding 3 seconds and repeating 10 times when permitted by her physician/surgeon.   ASSESSMENT:  CLINICAL IMPRESSION: Her multidisciplinary medical team has met to assess and determine a recommended treatment plan. She is planning to have left lumpectomy on 04/22/2023. She will  benefit from a post op OT reassessment range of motion in Lupper extremity as well as to determine needs and from L-Dex screens every 3 months for 2 years to detect subclinical lymphedema.  Pt will benefit from skilled therapeutic intervention to improve on the following deficits: Decreased knowledge of precautions and lymphedema education, impaired UE functional use, pain, decreased ROM, postural dysfunction.   OT treatment/interventions: ADL/self-care home management, pt/family education, therapeutic exercise,manual therapy  REHAB POTENTIAL: Good  CLINICAL DECISION MAKING: Stable/uncomplicated  EVALUATION COMPLEXITY: Low   GOALS: Goals reviewed with patient? YES  LONG TERM GOALS: (STG=LTG)    Name Target Date Goal status  1 Pt will be able to verbalize understanding of pertinent lymphedema risk reduction practices relevant to her dx specifically related to skin care.  Baseline:  No knowledge 8 weeks Ongoing  2 Pt will be able to return demo and/or verbalize understanding of the post op HEP related to regaining shoulder ROM. Baseline:  No knowledge 04/15/2023 Achieved at eval       4 Pt will demo she has regained full shoulder ROM and function post operatively compared to baselines.  Baseline: See objective measurements taken today. 8 weeks Initial    PLAN:  OT FREQUENCY/DURATION: EVAL and 3 follow up appointment.   PLAN FOR NEXT SESSION: will reassess 2-4 weeks post op to determine needs.   Patient will follow up at outpatient cancer rehab 2-4 weeks following surgery.  If the patient requires occupational therapy at that time, a specific plan will be dictated and sent to the referring physician for approval. T Occupational Therapy Information for After Breast Cancer Surgery/Treatment:  Lymphedema is a swelling condition that you may be at risk for in your arm if you have lymph nodes removed from the armpit area.  After a sentinel node biopsy, the risk is approximately 5-9% and  is higher after an axillary node dissection.  There is treatment available for this condition and it is not life-threatening.  Contact your physician or occupational therapist with concerns. You may begin the 4 shoulder/posture exercises (see additional sheet) when permitted by your physician (typically a week after surgery).  If you have drains, you may need to wait until those are removed before beginning range of motion exercises.  A general recommendation is to not lift your arms above shoulder height until drains are removed.  These exercises should be done to your tolerance and gently.  This is not a no pain/no gain type of recovery so listen to your body and stretch into the range of motion that you can tolerate, stopping if you have pain.  If you are having immediate reconstruction, ask your plastic surgeon about doing exercises as he or she may want you to wait. .  While undergoing any medical procedure or treatment, try to avoid blood pressure being taken or needle sticks from occurring on the arm on the side of cancer.   This recommendation begins after surgery and continues for the rest of your life.  This may help reduce your risk of getting lymphedema (swelling in your arm). An excellent resource for those seeking information on lymphedema is the National Lymphedema Network's web site. It can be accessed at www.lymphnet.org If you notice swelling in your hand, arm or breast at any time following surgery (even if it is many years from now), please contact your doctor or occupational therapist to discuss this.  Lymphedema can be treated at any time but it is easier for you if it is treated early on.  If you feel like your shoulder motion is not returning to normal in a reasonable amount of time, please contact your surgeon or occupational therapist.  [facility] [phone]. [address], [place], [place] [address]       Ancel Peters,  OTR/L,CLT 04/15/2023, 5:39 PM

## 2023-04-16 ENCOUNTER — Encounter
Admission: RE | Admit: 2023-04-16 | Discharge: 2023-04-16 | Disposition: A | Discharge status: Home / Self Care | Payer: Medicare HMO | Source: Ambulatory Visit | Attending: Surgery | Admitting: Surgery

## 2023-04-16 ENCOUNTER — Ambulatory Visit
Admission: RE | Admit: 2023-04-16 | Discharge: 2023-04-16 | Disposition: A | Discharge status: Home / Self Care | Payer: Medicare HMO | Source: Ambulatory Visit | Attending: General Practice | Admitting: General Practice

## 2023-04-16 ENCOUNTER — Encounter: Payer: Self-pay | Admitting: *Deleted

## 2023-04-16 DIAGNOSIS — E1140 Type 2 diabetes mellitus with diabetic neuropathy, unspecified: Secondary | ICD-10-CM

## 2023-04-16 DIAGNOSIS — I10 Essential (primary) hypertension: Secondary | ICD-10-CM | POA: Insufficient documentation

## 2023-04-16 DIAGNOSIS — J4540 Moderate persistent asthma, uncomplicated: Secondary | ICD-10-CM | POA: Insufficient documentation

## 2023-04-16 DIAGNOSIS — Z01812 Encounter for preprocedural laboratory examination: Secondary | ICD-10-CM

## 2023-04-16 DIAGNOSIS — J479 Bronchiectasis, uncomplicated: Secondary | ICD-10-CM | POA: Insufficient documentation

## 2023-04-16 NOTE — Progress Notes (Signed)
 Appt. Scheduled to see Bianca Rogers after surgery for SOZO eval.

## 2023-04-16 NOTE — Progress Notes (Signed)
 Established Patient Office Visit  Subjective   Patient ID: Bianca Rogers, female    DOB: February 14, 1951  Age: 73 y.o. MRN: [id number]  Chief Complaint  Patient presents with   Rash    Patient has an itchy white patch on back of neck x months. Has not tried applying any creams or anything to area.    Medication Refill    Requesting 90 day refill on gabapentin      HPI  Bianca Rogers is a 73 year old female with past medical history of invasive carcinoma of breast, bronchiectasis, HTN, GERD, liver cirrhosis presents today for an acute visit.   Rash: Small, itchy bump located on the back of head on mid left side. Noticed about a bump ago. She scratched the top off of it but it didn't go away. No pain or drainage. No other spots. She also has a mole on the right side of her face below her eye. It has grown over the years. She would like to get it looked at it by the dermatology.   Patient Active Problem List   Diagnosis Date Noted   Change in facial mole 04/17/2023   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 breast of female, estrogen receptor positive (HCC) 04/09/2023   Invasive carcinoma of breast (HCC) 04/08/2023   Family history of breast cancer 04/08/2023   Gastroesophageal reflux disease 03/02/2023   Liver cirrhosis secondary to NASH (HCC) 03/02/2023   Bronchiectasis (HCC) 06/09/2013   Type 2 diabetes mellitus with diabetic neuropathy (HCC) 06/09/2013   HTN (hypertension), benign 06/09/2013   Past Medical History:  Diagnosis Date   Anal fissure 12/09/2016   Added automatically from request for surgery [id number]    Added automatically from request for surgery [id number]     Arthralgia of left knee 12/03/2020   Asthma 08/29/2022   Chronic/stable.    Follows with Dr. Elayne, pulmonology.   Using Flovent  and albuterol  inhaler as needed.     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     Heart murmur    History of total knee arthroplasty 08/21/2021   Hyperlipidemia 08/29/2022   Lab Results   Component Value Date    Cholesterol 197 01/28/2022      Lab Results   Component Value Date    HDL 56 01/28/2022      Lab Results   Component Value Date    LDL Calculated 115 01/28/2022      Lab Results   Component Value Date    Triglycerides 131 01/28/2022      Lab Results   Component Value Date    Chol/HDL Ratio 3.5 01/28/2022      Criteria used to determine recommendation include:    Hypertension    Invasive carcinoma of breast (HCC)    Liver cirrhosis secondary to NASH (HCC)    Lung nodule    Major depressive disorder in full remission (HCC) 04/02/2020   Mood stable on venlafaxine  150 mg daily.     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 breast of female, estrogen receptor positive (HCC)  Migraine 08/29/2022   NAFLD (nonalcoholic fatty liver disease) 92/76/7985   Non-alcoholic cirrhosis (HCC)    Osteoarthritis of left knee 09/03/2020   Peritoneal hematoma 07/07/2019   Sprain of MCL (medial collateral ligament) of knee 09/03/2020   Symptom associated with female genital organs 07/09/2010   Allergies  Allergen Reactions   Penicillins Rash    Other reaction(s): unknown reaction   Codeine Other (See Comments)    Irritable, insomnia   Zoloft [Sertraline Hcl] Other (See Comments)    Paradoxical response         04/17/2023    2:51 PM 04/08/2023   11:38 AM 03/02/2023    9:46 AM  Depression screen PHQ 2/9  Decreased Interest 1 0 0  Down, Depressed, Hopeless 1 1 1   PHQ - 2 Score 2 1 1   Altered sleeping 2  2  Tired, decreased energy 1  3  Change in appetite 2  1  Feeling bad  or failure about yourself  0  0  Trouble concentrating 0  1  Moving slowly or fidgety/restless 0  1  Suicidal thoughts 0  0  PHQ-9 Score 7  9  Difficult doing work/chores Not difficult at all  Somewhat difficult       04/17/2023    2:51 PM 03/02/2023    9:46 AM  GAD 7 : Generalized Anxiety Score  Nervous, Anxious, on Edge 2 0  Control/stop worrying 1 1  Worry too much - different things 1 1  Trouble relaxing 1 1  Restless 1 0  Easily annoyed or irritable 2 1  Afraid - awful might happen 0 0  Total GAD 7 Score 8 4  Anxiety Difficulty Not difficult at all Somewhat difficult      Review of Systems  Constitutional:  Negative for chills and fever.  Respiratory:  Negative for shortness of breath.   Cardiovascular:  Negative for chest pain.  Gastrointestinal:  Negative for abdominal pain, constipation, diarrhea, heartburn, nausea and vomiting.  Genitourinary:  Negative for dysuria, frequency and urgency.  Neurological:  Negative for dizziness and headaches.  Endo/Heme/Allergies:  Negative for polydipsia.  Psychiatric/Behavioral:  Negative for depression and suicidal ideas. The patient is not nervous/anxious.       Objective:     BP 120/70 (BP Location: Left Arm, Patient Position: Sitting, Cuff Size: Normal)   Pulse 92   Temp 98.1 F (36.7 C) (Oral)   Ht 5' 4 (1.626 m)   Wt 162 lb (73.5 kg)   SpO2 97%   BMI 27.81 kg/m  BP Readings from Last 3 Encounters:  04/17/23 120/70  04/09/23 (!) 159/80  04/08/23 108/71   Wt Readings from Last 3 Encounters:  04/17/23 162 lb (73.5 kg)  04/15/23 158 lb (71.7 kg)  04/09/23 158 lb (71.7 kg)      Physical Exam Vitals and nursing note reviewed.  Constitutional:      Appearance: Normal appearance.  Cardiovascular:     Rate and Rhythm: Normal rate and regular rhythm.     Pulses: Normal pulses.     Heart sounds: Normal heart sounds.  Pulmonary:     Effort: Pulmonary effort is normal.     Breath sounds: Normal breath sounds.   Skin:    General: Skin is warm.     Findings: Rash present.       Neurological:     Mental Status: She is alert and oriented to person, place, and time.  Psychiatric:        Mood  and Affect: Mood normal.        Behavior: Behavior normal.        Thought Content: Thought content normal.        Judgment: Judgment normal.         No results found for any visits on 04/17/23.     The 10-year ASCVD risk score (Arnett DK, et al., 2019) is: 27.1%    Assessment & Plan:  Psoriasis of scalp Assessment & Plan: 4 mm dry scaly silver patch located on the mid back of head.   Rx sent for clobetasol .   She will update if symptoms worsen or do not improve.  Orders: -     Ambulatory referral to Dermatology -     Clobetasol  Propionate; Apply 1 Application topically 2 (two) times daily.  Dispense: 60 g; Refill: 0  Type 2 diabetes mellitus with diabetic neuropathy, unspecified whether long term insulin  use (HCC) Assessment & Plan: Refills sent for gabapentin .  Orders: -     Gabapentin ; Take 1 capsule (100 mg total) by mouth 2 (two) times daily.  Dispense: 120 capsule; Refill: 0  Change in facial mole Assessment & Plan: Has been there for years.  Does not look concerning but patient said that it has been growing and she would like to have a dermatologist look at it.   Referral placed.   Orders: -     Ambulatory referral to Dermatology     Return if symptoms worsen or fail to improve.    Carrol Aurora, NP

## 2023-04-17 ENCOUNTER — Encounter: Payer: Self-pay | Admitting: General Practice

## 2023-04-17 ENCOUNTER — Ambulatory Visit (INDEPENDENT_AMBULATORY_CARE_PROVIDER_SITE_OTHER): Payer: Medicare HMO | Admitting: General Practice

## 2023-04-17 VITALS — BP 120/70 | HR 92 | Temp 98.1°F | Ht 64.0 in | Wt 162.0 lb

## 2023-04-17 DIAGNOSIS — E1140 Type 2 diabetes mellitus with diabetic neuropathy, unspecified: Secondary | ICD-10-CM

## 2023-04-17 DIAGNOSIS — L409 Psoriasis, unspecified: Secondary | ICD-10-CM | POA: Diagnosis not present

## 2023-04-17 DIAGNOSIS — D2230 Melanocytic nevi of unspecified part of face: Secondary | ICD-10-CM | POA: Diagnosis not present

## 2023-04-17 MED ORDER — CLOBETASOL PROPIONATE 0.05 % EX CREA: 1.0000 | TOPICAL_CREAM | Freq: Two times a day (BID) | CUTANEOUS | 0 refills | Status: DC

## 2023-04-17 MED ORDER — GABAPENTIN 100 MG PO CAPS: 100.0000 mg | ORAL_CAPSULE | Freq: Two times a day (BID) | ORAL | 0 refills | Status: DC

## 2023-04-17 NOTE — Patient Instructions (Signed)
 Start clobetasol  twice a day.   Gabapentin  refills sent.   You will either be contacted via phone regarding your referral to dermatology , or you may receive a letter on your MyChart portal from our referral team with instructions for scheduling an appointment. Please let us  know if you have not been contacted by anyone within two weeks.  Please update me if your symptoms worsen or fail to improve.   It was a pleasure to see you today!

## 2023-04-17 NOTE — Assessment & Plan Note (Signed)
Refills sent for gabapentin  

## 2023-04-17 NOTE — Assessment & Plan Note (Signed)
 Has been there for years.  Does not look concerning but patient said that it has been growing and she would like to have a dermatologist look at it.   Referral placed.

## 2023-04-17 NOTE — Assessment & Plan Note (Signed)
 4 mm dry scaly silver patch located on the mid back of head.   Rx sent for clobetasol .   She will update if symptoms worsen or do not improve.

## 2023-04-20 ENCOUNTER — Ambulatory Visit
Admission: RE | Admit: 2023-04-20 | Discharge: 2023-04-20 | Disposition: A | Discharge status: Home / Self Care | Payer: Medicare HMO | Source: Ambulatory Visit | Attending: Surgery | Admitting: Surgery

## 2023-04-20 ENCOUNTER — Inpatient Hospital Stay: Payer: Medicare HMO | Attending: Oncology

## 2023-04-20 ENCOUNTER — Encounter: Payer: Self-pay | Admitting: Genetic Counselor

## 2023-04-20 ENCOUNTER — Ambulatory Visit (HOSPITAL_BASED_OUTPATIENT_CLINIC_OR_DEPARTMENT_OTHER): Payer: Medicare HMO | Admitting: Genetic Counselor

## 2023-04-20 ENCOUNTER — Other Ambulatory Visit: Payer: Medicare HMO

## 2023-04-20 DIAGNOSIS — Z803 Family history of malignant neoplasm of breast: Secondary | ICD-10-CM | POA: Diagnosis not present

## 2023-04-20 DIAGNOSIS — C50512 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 female breast: Secondary | ICD-10-CM

## 2023-04-20 DIAGNOSIS — C50919 Malignant neoplasm of unspecified site of unspecified female breast: Secondary | ICD-10-CM | POA: Diagnosis present

## 2023-04-20 DIAGNOSIS — Z170 Estrogen receptor positive status [ER+]: Secondary | ICD-10-CM

## 2023-04-20 HISTORY — PX: BREAST BIOPSY: SHX20

## 2023-04-20 LAB — GENETIC SCREENING ORDER

## 2023-04-20 MED ORDER — LIDOCAINE HCL 1 % IJ SOLN
5.0000 mL | Freq: Once | INTRAMUSCULAR | Status: AC
Start: 2023-04-20 — End: 2023-04-20
  Administered 2023-04-20: 5 mL
  Filled 2023-04-20: qty 5

## 2023-04-20 NOTE — Progress Notes (Signed)
 REFERRING PROVIDER: Dr. Timmy Forbes   PRIMARY PROVIDER:  Jolanda Nation, NP  PRIMARY REASON FOR VISIT:  1. 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 breast of female, estrogen receptor positive (HCC)   2. Family history of breast cancer     HISTORY OF PRESENT ILLNESS:   Bianca Rogers, a 73 y.o. female, was seen for a [facility] cancer genetics consultation at the request of Dr. Wilhelmenia Harada due to a personal and family history of cancer.  Bianca Rogers presents to clinic today to discuss the possibility of a hereditary predisposition to cancer, genetic testing, and to further clarify her future cancer risks, as well as potential cancer risks for family members.   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 She is scheduled for a lumpectomy 04/22/23.    CANCER HISTORY:  Oncology History  Invasive carcinoma of breast (HCC)  03/26/2023 Mammogram   Diagnostic unilateral left breast mammogram showed 1. 0.8 cm suspicious mass within the LOWER OUTER LEFT breast, likely representing the enlarging mammographic finding. Tissue sampling is recommended. No abnormal appearing LEFT axillary lymph nodes.   04/08/2023 Initial Diagnosis   Invasive carcinoma of breast [facility])  Patient presented for left breast diagnostic mammogram for follow-up of left breast asymmetry from outside facility.  She denies any breast concerns.  Left breast 5:00 4 cm from nipple needle core biopsy showed - INVASIVE MAMMARY CARCINOMA, NO SPECIAL TYPE.       - TUBULE FORMATION: SCORE 3       - NUCLEAR PLEOMORPHISM: SCORE 3       - MITOTIC COUNT: SCORE 2       - TOTAL SCORE: 8       - OVERALL GRADE: 3       - LYMPHOVASCULAR INVASION: NOT IDENTIFIED       - CANCER LENGTH: 8 MM       - CALCIFICATIONS: NOT IDENTIFIED       - DUCTAL CARCINOMA IN SITU: PRESENT, HIGH-GRADE   ER 100%, PR 95%, HER2 negative [0]   Menarche at age of 32 or 9. First live  birth at age of 37 OCP use: Less than 5 years of use. History of hysterectomy: Yes with removal of 1 ovary. Menopausal status: Postmenopausal History of HRT use: Denies History of chest radiation: Denies Number of previous breast biopsies: Denies Family history positive for multiple family members with breast cancer.     04/08/2023 Cancer Staging   Staging form: Breast, AJCC 8th Edition - Clinical stage from 04/08/2023: Stage IA (cT1b, cN0, cM0, G3, ER+, PR+, HER2-) - Signed by Timmy Forbes, MD on 04/08/2023 Stage prefix: Initial diagnosis Histologic grading system: 3 grade system      RISK FACTORS:  Menarche was at age 57-13.  First live birth at age 42.  OCP use for approximately 4 years.  Ovaries intact: reports half of one ovary intact.  Hysterectomy: yes.  Menopausal status: postmenopausal.  HRT use: 0 years. Colonoscopy: yes;  reports around 10 polyps total diagnosed by colonoscopy . Bianca Rogers reports benign polyps. Mammogram within the last year: yes. Number of breast biopsies: 1. Any excessive radiation exposure in the past: no  Past Medical History:  Diagnosis Date   Anal fissure 12/09/2016   Added automatically from request for surgery [id number]    Added automatically from request for surgery [id number]     Arthralgia of left knee 12/03/2020  Asthma 08/29/2022   Chronic/stable.    Follows with Dr. Lella Putt, pulmonology.   Using Flovent  and albuterol  inhaler as needed.     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     Heart murmur    History of total knee arthroplasty 08/21/2021   Hyperlipidemia 08/29/2022   Lab Results    Component Value Date    Cholesterol 197 01/28/2022      Lab Results   Component Value Date    HDL 56 01/28/2022      Lab Results   Component Value Date    LDL Calculated 115 01/28/2022      Lab Results   Component Value Date    Triglycerides 131 01/28/2022      Lab Results   Component Value Date    Chol/HDL Ratio 3.5 01/28/2022      Criteria used to determine recommendation include:    Hypertension    Invasive carcinoma of breast (HCC)    Liver cirrhosis secondary to NASH (HCC)    Lung nodule    Major depressive disorder in full remission (HCC) 04/02/2020   Mood stable on venlafaxine 150 mg daily.     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 breast of female, estrogen receptor positive (HCC)    Migraine 08/29/2022   NAFLD (nonalcoholic fatty liver disease) 40/12/2723   Non-alcoholic cirrhosis (HCC)    Osteoarthritis of left knee 09/03/2020   Peritoneal hematoma 07/07/2019   Sprain of MCL (medial collateral ligament) of knee 09/03/2020   Symptom associated with female genital organs 07/09/2010    Past Surgical History:  Procedure Laterality Date   ABDOMINAL HYSTERECTOMY     BREAST BIOPSY Left 03/31/2023   Us  Core bx, coil clip - path pending   BREAST BIOPSY Left 03/31/2023   US  LT BREAST BX W LOC DEV 1ST LESION IMG BX SPEC US  GUIDE 03/31/2023 [facility]   CARDIAC CATHETERIZATION  03/10/2012   Negative   CATARACT EXTRACTION Bilateral    CESAREAN SECTION     CHOLECYSTECTOMY     SPLENECTOMY     TONSILLECTOMY      Social History   Socioeconomic History   Marital status: Legally Separated    Spouse name: Not on file   Number of children: 4   Years of education: Not on file   Highest education level: GED or equivalent  Occupational History   Occupation: Retired  Tobacco Use   Smoking status: Former    Current packs/day: 0.00    Average packs/day: 2.0 packs/day for 7.2 years (14.5 ttl pk-yrs)    Types: Cigarettes    Start date: 30    Quit date: 06/10/1983     Years since quitting: 39.8   Smokeless tobacco: Never  Vaping Use   Vaping status: Never Used  Substance and Sexual Activity   Alcohol use: No   Drug use: No   Sexual activity: Not on file  Other Topics Concern   Not on file  Social History Narrative   Not on file   Social Drivers of Health   Financial Resource Strain: Low Risk  (04/13/2023)   Overall Financial Resource  Strain (CARDIA)    Difficulty of Paying Living Expenses: Not very hard  Food Insecurity: No Food Insecurity (04/13/2023)   Hunger Vital Sign    Worried About Running Out of Food in the Last Year: Never true    Ran Out of Food in the Last Year: Never true  Transportation Needs: No Transportation Needs (04/13/2023)   PRAPARE - Administrator, Civil Service (Medical): No    Lack of Transportation (Non-Medical): No  Physical Activity: Unknown (04/13/2023)   Exercise Vital Sign    Days of Exercise per Week: 0 days    Minutes of Exercise per Session: Not on file  Stress: No Stress Concern Present (04/13/2023)   [employer] of Occupational Health - Occupational Stress Questionnaire    Feeling of Stress : Not at all  Social Connections: Moderately Isolated (04/13/2023)   Social Connection and Isolation Panel [NHANES]    Frequency of Communication with Friends and Family: More than three times a week    Frequency of Social Gatherings with Friends and Family: Twice a week    Attends Religious Services: More than 4 times per year    Active Member of [employer] or Organizations: No    Attends Engineer, structural: Not on file    Marital Status: Separated     FAMILY HISTORY:  We obtained a detailed, 4-generation family history.  Significant diagnoses are listed below: Family History  Problem Relation Age of Onset   Transient ischemic attack Mother    Thyroid disease Mother    Arthritis Mother    Hearing loss Mother    High blood pressure Mother    High Cholesterol Mother    Miscarriages / [place]  Mother    Stroke Mother    Cancer Father 23       metastatic, possible lung or prostate primary   Arthritis Father    Asthma Father    COPD Father    Depression Father    Early death Father    Hearing loss Father    Heart disease Father    High Cholesterol Father    Breast cancer Sister 28   Arthritis Sister    Birth defects Sister    Cancer Sister        breast cancer   Diabetes Sister    High blood pressure Sister    Diabetes Paternal Grandmother    Arthritis Paternal Grandfather    Cancer Paternal Grandfather    Breast cancer Daughter 70       TNBC   Cervical cancer Daughter    Breast cancer Niece 73    Bianca Rogers Reports her daughter, age 74, was recently diagnosed with triple negative breast cancer and had normal genetic testing. She reports her daughter, age 46, was diagnosed with cervical cancer at age 30. She reports her daughter, age 45, has genetic testing in process. She reports her grandson, age 1, was diagnosed with Hodgkins lymphoma at age 25. Bianca Rogers reports her sister, age 6, was diagnosed with breast cancer at age 56 and has had normal genetic testing. She reports her niece, age 9, had breast cancer at age 39 and had genetic testing at time of diagnosis that was normal. Familial genetic test reports were not available for review. Bianca Rogers reports her father was diagnosed with metastatic cancer, primary suspected to be either lung or prostate, at 42 and passed away shortly after diagnosis. She reports a maternal uncle diagnosed with an unknown cancer, living at  age 103. She reports a maternal uncle diagnosed with prostate cancer at age 33, living at age 73. She reports a maternal first cousin diagnosed with prostate cancer in his 26s-50s. She reports her maternal grandfather was diagnosed with prostate cancer, metastatic to bone. There is no reported Ashkenazi Jewish ancestry. There is no known consanguinity.     GENETIC COUNSELING ASSESSMENT: Ms. Harston is a 73  y.o. female with a personal and family history of cancer which is somewhat suggestive of a hereditary predisposition to cancer. We, therefore, discussed and recommended the following at today's visit.   DISCUSSION: We discussed that, in general, most cancer is not inherited in families, but instead is sporadic or familial. Sporadic cancers occur by chance and typically happen at older ages (>50 years) as this type of cancer is caused by genetic changes acquired during an individual's lifetime. Some families have more cancers than would be expected by chance; however, the ages or types of cancer are not consistent with a known genetic mutation or known genetic mutations have been ruled out. This type of familial cancer is thought to be due to a combination of multiple genetic, environmental, hormonal, and lifestyle factors. While this combination of factors likely increases the risk of cancer, the exact source of this risk is not currently identifiable or testable.  We discussed that 5 - 10% of cancer is hereditary.  We discussed that testing is beneficial for several reasons including knowing how to follow individuals after completing their treatment, identifying whether potential treatment options such as PARP inhibitors would be beneficial, and understand if other family members could be at risk for cancer and allow them to undergo genetic testing.   We reviewed the characteristics, features and inheritance patterns of hereditary cancer syndromes. We also discussed genetic testing, including the appropriate family members to test, the process of testing, insurance coverage and turn-around-time for results. We discussed the implications of a negative, positive, carrier and/or variant of uncertain significant result. Bianca Rogers  was offered a common hereditary cancer panel (36+ genes) and an expanded pan-cancer panel (70+ genes). Bianca Rogers was informed of the benefits and limitations of each panel, including  that expanded pan-cancer panels contain genes that do not have clear management guidelines at this point in time.  We also discussed that as the number of genes included on a panel increases, the chances of variants of uncertain significance increases. Bianca Rogers decided to pursue genetic testing for the 51 gene CancerNext-Expanded +RNAinsight panel. The CancerNext-Expanded gene panel offered by [facility] and includes sequencing, rearrangement, and RNA analysis for the following 76 genes: AIP, ALK, APC, ATM, AXIN2, BAP1, BARD1, BMPR1A, BRCA1, BRCA2, BRIP1, CDC73, CDH1, CDK4, CDKN1B, CDKN2A, CEBPA, CHEK2, CTNNA1, DDX41, DICER1, ETV6, FH, FLCN, GATA2, LZTR1, MAX, MBD4, MEN1, MET, MLH1, MSH2, MSH3, MSH6, MUTYH, NF1, NF2, NTHL1, PALB2, PHOX2B, PMS2, POT1, PRKAR1A, PTCH1, PTEN, RAD51C, RAD51D, RB1, RET, RUNX1, SDHA, SDHAF2, SDHB, SDHC, SDHD, SMAD4, SMARCA4, SMARCB1, SMARCE1, STK11, SUFU, TMEM127, TP53, TSC1, TSC2, VHL, and WT1 (sequencing and deletion/duplication); EGFR, HOXB13, KIT, MITF, PDGFRA, POLD1, and POLE (sequencing only); EPCAM and GREM1 (deletion/duplication only).   Based on Bianca Rogers's personal and family history of cancer, she meets medical criteria for genetic testing.  Despite that she meets criteria, she may still have an out of pocket cost. We discussed that if her out of pocket cost for testing is over $100, the laboratory will call and confirm whether she wants to proceed with testing.  If the out of pocket  cost of testing is less than $100 she will be billed by the genetic testing laboratory.   We discussed that some people do not want to undergo genetic testing due to fear of genetic discrimination.  The Genetic Information Nondiscrimination Act (GINA) was signed into federal law in 2008. GINA prohibits health insurers and most employers from discriminating against individuals based on genetic information (including the results of genetic tests and family history information). According to  GINA, health insurance companies cannot consider genetic information to be a preexisting condition, nor can they use it to make decisions regarding coverage or rates. GINA also makes it illegal for most employers to use genetic information in making decisions about hiring, firing, promotion, or terms of employment. It is important to note that GINA does not offer protections for life insurance, disability insurance, or long-term care insurance. GINA does not apply to those in the [employer], those who work for companies with less than 15 employees, and new life insurance or long-term disability insurance policies.  Health status due to a cancer diagnosis is not protected under GINA. More information about GINA can be found by visiting [email or web address].  We reviewed the characteristics, features and inheritance patterns of hereditary cancer syndromes. We also discussed genetic testing, including the appropriate family members to test, the process of testing, insurance coverage and turn-around-time for results. We discussed the implications of a negative, positive, uncertain and unexpected results.   PLAN: After considering the risks, benefits, and limitations, Bianca Rogers provided informed consent to pursue genetic testing and the blood sample was sent to [facility] for analysis of the CancerNext-Expanded +RNAinsight. Results should be available within approximately 2-3 weeks' time, at which point they will be disclosed by telephone to Bianca Rogers, as will any additional recommendations warranted by these results. Bianca Rogers will receive a summary of her genetic counseling visit and a copy of her results once available. This information will also be available in Epic.   Lastly, we encouraged Bianca Rogers to remain in contact with cancer genetics annually so that we can continuously update the family history and inform her of any changes in cancer genetics and testing that may be of benefit for this  family.   Bianca Rogers questions were answered to her satisfaction today. Our contact information was provided should additional questions or concerns arise. Thank you for the referral and allowing us  to share in the care of your patient.   Jobie Mulders, MS, [facility] Licensed, Retail [email or web address] phone: [phone]  58 minutes were spent on the date of the encounter in service to the patient including preparation, face-to-face consultation, documentation and care coordination.  The patient was seen alone. Drs. Johnna Nakai, and/or Gudena were available for questions, if needed..   I connected with  Bianca Rogers on 04/20/2023 at 12:36 EDT by MyChart video conference and verified that I am speaking with the correct person using two identifiers.   Patient location: home Provider location: office _______________________________________________________________________ For Office Staff:  Number of people involved in session: 1 Was an Intern/ student involved with case: no

## 2023-04-21 ENCOUNTER — Encounter: Payer: Self-pay | Admitting: Gastroenterology

## 2023-04-21 ENCOUNTER — Ambulatory Visit: Payer: Medicare HMO | Admitting: Gastroenterology

## 2023-04-21 VITALS — BP 147/62 | HR 92 | Temp 97.5°F | Ht 64.0 in | Wt 161.4 lb

## 2023-04-21 DIAGNOSIS — K7460 Unspecified cirrhosis of liver: Secondary | ICD-10-CM | POA: Diagnosis not present

## 2023-04-21 DIAGNOSIS — K7581 Nonalcoholic steatohepatitis (NASH): Secondary | ICD-10-CM

## 2023-04-21 NOTE — Patient Instructions (Addendum)
Your RUQ ultrasound is schedule for 05/03/2022 arrive at 8:15am at [facility] for a 8:30am scan. Nothing to eat or drink after midnight. If you need to reschedule please call [phone] option 3 and then 2.

## 2023-04-21 NOTE — Progress Notes (Signed)
Arlyss Repress, MD [address]  [address]  [place], [place] [address]  Main: [phone]  Fax: [phone]    Gastroenterology Consultation  Referring Provider:     Modesto Charon, NP Primary Care Physician:  Modesto Charon, NP Primary Gastroenterologist:  Dr. Arlyss Repress Reason for Consultation: Cirrhosis of liver        HPI:   Bianca Rogers is a 73 y.o. 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  Patient was previously under the care of gastroenterology at [facility].  She moved to [place] and would like to establish care locally.  She has history of metabolic syndrome, hypertension, MASH cirrhosis.  Also with history of mildly dilated CBD and normal LFTs, EGD/EUS on 07/06/2019 showed changes suggestive of chronic pancreatitis with no evidence of biliary ductal dilatation or obstruction.  No evidence of varices, or portal hypertensive gastropathy.  No known history of liver lesions.  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  She has done well over the years.  Her cirrhosis is well compensated.  Denies any abdominal distention, swelling of legs, black stools, hematemesis.  She is recently diagnosed with breast cancer, scheduled to undergo surgery tomorrow.  Closely followed by the oncologist, Dr. Cathie Hoops.  She does not smoke or drink alcohol Her biggest weakness is eating rice and potatoes resulting in weight gain  NSAIDs: None  Antiplts/Anticoagulants/Anti thrombotics: None  GI Procedures:   EGD/EUS 07/06/19 Dr. Jean Rosenthal for RUQ pain, dilated CBD, elevated AFP, cirrhosis and dysphagia -Small hiatal hernia -No esophageal varices or stricture -Mild chronic pancreatitis -Changes of cirrhosis without mass -No evidence of CBD duct stones/sludge/obstruction or ampullary lesion -Simple pancreas neck cyst not aspirated due to size too small  EGD 12/16/2018  Dr. Derrek Gu for cirrhosis, screen for varices and dysphagia -No esophageal varices -Empiric esophageal dilation to 17 mm Savary dilator  CLN 11/11/2016 Dr Pervis Hocking [facility] -normal colon +skin tags +internal small hemorrhoids  EGD 05/2015 Dr. Mechele Collin at [facility]- normal -portal hypertensive gastropathy,no varices  CLN 05/29/15 Dr Glenis Smoker [facility] -normal   CLN 07/22/2012 Dr Gita Kudo -Perianal skin tags -9 mm polyp hepatic flexure-ademomatous -four 3-6mm hepatic flexure -hyperplastic -non-bleeding hemorrhoids   Past Medical History:  Diagnosis Date   Anal fissure 12/09/2016   Added automatically from request for surgery [id number]    Added automatically from request for surgery [id number]     Arthralgia of left knee 12/03/2020   Asthma 08/29/2022   Chronic/stable.    Follows with Dr. Westley Hummer, pulmonology.   Using Flovent and albuterol inhaler as needed.     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     Heart murmur    History of total knee arthroplasty 08/21/2021   Hyperlipidemia 08/29/2022   Lab Results   Component Value Date    Cholesterol 197 01/28/2022      Lab Results   Component Value Date    HDL 56 01/28/2022      Lab Results   Component Value Date    LDL Calculated 115  01/28/2022      Lab Results   Component Value Date    Triglycerides 131 01/28/2022      Lab Results   Component Value Date    Chol/HDL Ratio 3.5 01/28/2022      Criteria used to determine recommendation include:    Hypertension    Invasive carcinoma of breast (HCC)    Liver cirrhosis secondary to NASH (HCC)    Lung nodule    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 breast of female, estrogen receptor positive (HCC)    Migraine 08/29/2022   NAFLD (nonalcoholic fatty liver disease) 16/12/9602   Non-alcoholic cirrhosis (HCC)    Osteoarthritis of left knee 09/03/2020   Peritoneal hematoma 07/07/2019   Sprain of MCL (medial collateral ligament) of knee 09/03/2020   Symptom associated with female genital organs 07/09/2010    Past Surgical History:  Procedure Laterality Date   ABDOMINAL HYSTERECTOMY     BREAST BIOPSY Left 03/31/2023   [place] Core bx, coil clip - path pending   BREAST BIOPSY Left 03/31/2023   [place] LT BREAST BX W LOC DEV 1ST LESION IMG BX SPEC US GUIDE 03/31/2023 [facility]   BREAST BIOPSY Left 04/20/2023   [place] LT RADIO FREQUENCY TAG LOC US GUIDE 04/20/2023 [facility]   CARDIAC CATHETERIZATION  03/10/2012   Negative   CATARACT EXTRACTION Bilateral    CESAREAN SECTION     CHOLECYSTECTOMY     SPLENECTOMY     TONSILLECTOMY       Current Outpatient Medications:    albuterol (PROVENTIL HFA) 108 (90 BASE) MCG/ACT inhaler, Inhale 1 puff into the lungs every 6 (six) hours as needed for wheezing or shortness of breath., Disp: , Rfl:    clobetasol cream (TEMOVATE) 0.05 %, Apply 1 Application topically 2 (two) times daily., Disp: 60 g, Rfl: 0   EFFEXOR XR 150 MG 24 hr capsule, Take 150 mg by mouth daily with breakfast., Disp: , Rfl:    ezetimibe (ZETIA) 10 MG tablet, Take 10 mg by mouth daily., Disp: , Rfl:    gabapentin (NEURONTIN) 100 MG capsule, Take 1 capsule (100 mg total) by mouth 2 (two) times daily., Disp: 120 capsule, Rfl: 0   omeprazole (PRILOSEC) 20 MG capsule, Take 1 capsule (20 mg total) by mouth daily., Disp: 90 capsule, Rfl: 0   pravastatin (PRAVACHOL) 80 MG tablet, Take 80 mg by mouth daily., Disp: , Rfl:    Calcium Carbonate-Vitamin D (OYSTER SHELL CALCIUM/D) 500-5 MG-MCG TABS, Take 1 tablet by  mouth daily. (Patient not taking: Reported on 04/21/2023), Disp: , Rfl:    lisinopril-hydrochlorothiazide (ZESTORETIC) 20-12.5 MG tablet, Take 1 tablet by mouth daily., Disp: , Rfl:    loratadine (CLARITIN) 10 MG tablet, Take 10 mg by mouth daily as needed for allergies. (Patient not taking: Reported on 04/21/2023), Disp: , Rfl:    metFORMIN (GLUCOPHAGE-XR) 500 MG 24 hr tablet, Take 1,000 mg by mouth 2 (two) times daily with a meal. (Patient not taking: Reported on 04/21/2023), Disp: , Rfl:    Family History  Problem Relation Age of Onset   Transient ischemic attack Mother    Thyroid disease Mother    Arthritis Mother    Hearing loss Mother    High blood pressure Mother    High Cholesterol Mother    Miscarriages / [place] Mother    Stroke Mother    Cancer  Father 86       metastatic, possible lung or prostate primary   Arthritis Father    Asthma Father    COPD Father    Depression Father    Early death Father    Hearing loss Father    Heart disease Father    High Cholesterol Father    Breast cancer Sister 60   Arthritis Sister    Birth defects Sister    Cancer Sister        breast cancer   Diabetes Sister    High blood pressure Sister    Diabetes Paternal Grandmother    Arthritis Paternal Grandfather    Cancer Paternal Grandfather    Breast cancer Daughter 83       TNBC   Cervical cancer Daughter    Breast cancer Niece 50     Social History   Tobacco Use   Smoking status: Former    Current packs/day: 0.00    Average packs/day: 2.0 packs/day for 7.2 years (14.5 ttl pk-yrs)    Types: Cigarettes    Start date: 79    Quit date: 06/10/1983    Years since quitting: 39.8   Smokeless tobacco: Never  Vaping Use   Vaping status: Never Used  Substance Use Topics   Alcohol use: No   Drug use: No    Allergies as of 04/21/2023 - Review Complete 04/21/2023  Allergen Reaction Noted   Penicillins Rash 05/22/2011   Codeine Other (See Comments) 06/08/2013   Zoloft  [sertraline hcl] Other (See Comments) 06/08/2013    Review of Systems:    All systems reviewed and negative except where noted in HPI.   Physical Exam:  BP (!) 147/62 (BP Location: Left Arm, Patient Position: Sitting, Cuff Size: Normal)   Pulse 92   Temp (!) 97.5 F (36.4 C) (Oral)   Ht 5\' 4"  (1.626 m)   Wt 161 lb 6 oz (73.2 kg)   BMI 27.70 kg/m  No LMP recorded. Patient has had a hysterectomy.  General:   Alert,  Well-developed, well-nourished, pleasant and cooperative in NAD Head:  Normocephalic and atraumatic. Eyes:  Sclera clear, no icterus.   Conjunctiva pink. Ears:  Normal auditory acuity. Nose:  No deformity, discharge, or lesions. Mouth:  No deformity or lesions,oropharynx pink & moist. Neck:  Supple; no masses or thyromegaly. Lungs:  Respirations even and unlabored.  Clear throughout to auscultation.   No wheezes, crackles, or rhonchi. No acute distress. Heart:  Regular rate and rhythm; no murmurs, clicks, rubs, or gallops. Abdomen:  Normal bowel sounds. Soft, obese abdomen, non-tender and non-distended without masses, hepatosplenomegaly or hernias noted.  2 scars in central abdomen from previous surgery, no guarding or rebound tenderness.   Rectal: Not performed Msk:  Symmetrical without gross deformities. Good, equal movement & strength bilaterally. Pulses:  Normal pulses noted. Extremities:  No clubbing or edema.  No cyanosis. Neurologic:  Alert and oriented x3;  grossly normal neurologically. Skin:  Intact without significant lesions or rashes. No jaundice. Psych:  Alert and cooperative. Normal mood and affect.  Imaging Studies: Reviewed  Assessment and Plan:   Bianca Rogers is a 73 y.o. female with metabolic syndrome, hypertension, MASH cirrhosis with recent diagnosis of left breast cancer, ER positive, HER2 negative  MASH cirrhosis of liver, well compensated No evidence of portal hypertension Euvolemic Strict low-sodium diet Ultrasound liver for HCC  screening, check serum AFP levels Immunized against hepatitis A, B as well as received pneumonia vaccines  Follow up in 6 months   Arlyss Repress, MD

## 2023-04-22 ENCOUNTER — Ambulatory Visit: Payer: Medicare HMO | Admitting: Certified Registered"

## 2023-04-22 ENCOUNTER — Ambulatory Visit
Admission: RE | Admit: 2023-04-22 | Discharge: 2023-04-22 | Disposition: A | Discharge status: Home / Self Care | Payer: Medicare HMO | Source: Ambulatory Visit | Attending: Surgery | Admitting: Surgery

## 2023-04-22 ENCOUNTER — Encounter
Admission: RE | Admit: 2023-04-22 | Discharge: 2023-04-22 | Disposition: A | Discharge status: Home / Self Care | Payer: Medicare HMO | Source: Ambulatory Visit | Attending: Surgery | Admitting: Surgery

## 2023-04-22 ENCOUNTER — Other Ambulatory Visit: Payer: Self-pay

## 2023-04-22 ENCOUNTER — Encounter: Payer: Self-pay | Admitting: Surgery

## 2023-04-22 ENCOUNTER — Ambulatory Visit: Payer: Medicare HMO | Admitting: Urgent Care

## 2023-04-22 ENCOUNTER — Encounter
Admission: RE | Disposition: A | Discharge status: Home / Self Care | Payer: Self-pay | Source: Ambulatory Visit | Attending: Surgery

## 2023-04-22 DIAGNOSIS — K219 Gastro-esophageal reflux disease without esophagitis: Secondary | ICD-10-CM | POA: Insufficient documentation

## 2023-04-22 DIAGNOSIS — Z7984 Long term (current) use of oral hypoglycemic drugs: Secondary | ICD-10-CM | POA: Diagnosis not present

## 2023-04-22 DIAGNOSIS — C50919 Malignant neoplasm of unspecified site of unspecified female breast: Secondary | ICD-10-CM

## 2023-04-22 DIAGNOSIS — Z01812 Encounter for preprocedural laboratory examination: Secondary | ICD-10-CM

## 2023-04-22 DIAGNOSIS — Z170 Estrogen receptor positive status [ER+]: Secondary | ICD-10-CM

## 2023-04-22 DIAGNOSIS — J45909 Unspecified asthma, uncomplicated: Secondary | ICD-10-CM | POA: Diagnosis not present

## 2023-04-22 DIAGNOSIS — Z87891 Personal history of nicotine dependence: Secondary | ICD-10-CM | POA: Insufficient documentation

## 2023-04-22 DIAGNOSIS — C50512 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 female breast: Secondary | ICD-10-CM

## 2023-04-22 DIAGNOSIS — E119 Type 2 diabetes mellitus without complications: Secondary | ICD-10-CM | POA: Diagnosis not present

## 2023-04-22 DIAGNOSIS — D0512 Intraductal carcinoma in situ of left breast: Secondary | ICD-10-CM | POA: Insufficient documentation

## 2023-04-22 DIAGNOSIS — E1140 Type 2 diabetes mellitus with diabetic neuropathy, unspecified: Secondary | ICD-10-CM

## 2023-04-22 DIAGNOSIS — Z803 Family history of malignant neoplasm of breast: Secondary | ICD-10-CM | POA: Insufficient documentation

## 2023-04-22 DIAGNOSIS — I10 Essential (primary) hypertension: Secondary | ICD-10-CM | POA: Diagnosis not present

## 2023-04-22 HISTORY — PX: BREAST LUMPECTOMY WITH RADIOFREQUENCY TAG IDENTIFICATION: SHX6884

## 2023-04-22 HISTORY — PX: AXILLARY SENTINEL NODE BIOPSY: SHX5738

## 2023-04-22 LAB — GLUCOSE, CAPILLARY
Glucose-Capillary: 142 mg/dL — ABNORMAL HIGH (ref 70–99)
Glucose-Capillary: 169 mg/dL — ABNORMAL HIGH (ref 70–99)

## 2023-04-22 LAB — AFP TUMOR MARKER: AFP, Serum, Tumor Marker: 9.1 ng/mL (ref 0.0–9.2)

## 2023-04-22 SURGERY — BREAST LUMPECTOMY WITH RADIOFREQUENCY TAG IDENTIFICATION
Anesthesia: General | Site: Breast | Laterality: Left

## 2023-04-22 MED ORDER — GABAPENTIN 300 MG PO CAPS
300.0000 mg | ORAL_CAPSULE | ORAL | Status: AC
  Administered 2023-04-22: 300 mg via ORAL

## 2023-04-22 MED ORDER — PHENYLEPHRINE 80 MCG/ML (10ML) SYRINGE FOR IV PUSH (FOR BLOOD PRESSURE SUPPORT)
PREFILLED_SYRINGE | INTRAVENOUS | Status: DC | PRN
  Administered 2023-04-22 (×4): 160 ug via INTRAVENOUS

## 2023-04-22 MED ORDER — METHYLENE BLUE (ANTIDOTE) 1 % IV SOLN
INTRAVENOUS | Status: AC
  Filled 2023-04-22: qty 10

## 2023-04-22 MED ORDER — CHLORHEXIDINE GLUCONATE CLOTH 2 % EX PADS
6.0000 | MEDICATED_PAD | Freq: Once | CUTANEOUS | Status: AC
  Administered 2023-04-22: 6 via TOPICAL

## 2023-04-22 MED ORDER — METHYLENE BLUE (ANTIDOTE) 1 % IV SOLN
INTRAVENOUS | Status: DC | PRN
Start: 2023-04-22 — End: 2023-04-22
  Administered 2023-04-22: 5 mL

## 2023-04-22 MED ORDER — EPHEDRINE SULFATE-NACL 50-0.9 MG/10ML-% IV SOSY
PREFILLED_SYRINGE | INTRAVENOUS | Status: DC | PRN
  Administered 2023-04-22 (×2): 5 mg via INTRAVENOUS

## 2023-04-22 MED ORDER — ACETAMINOPHEN 500 MG PO TABS
ORAL_TABLET | ORAL | Status: AC
  Filled 2023-04-22: qty 2

## 2023-04-22 MED ORDER — STERILE WATER FOR IRRIGATION IR SOLN
Status: DC | PRN
Start: 2023-04-22 — End: 2023-04-22
  Administered 2023-04-22: 300 mL

## 2023-04-22 MED ORDER — MIDAZOLAM HCL 2 MG/2ML IJ SOLN
INTRAMUSCULAR | Status: AC
  Filled 2023-04-22: qty 2

## 2023-04-22 MED ORDER — LIDOCAINE HCL (CARDIAC) PF 100 MG/5ML IV SOSY
PREFILLED_SYRINGE | INTRAVENOUS | Status: DC | PRN
  Administered 2023-04-22: 100 mg via INTRAVENOUS

## 2023-04-22 MED ORDER — CHLORHEXIDINE GLUCONATE 0.12 % MT SOLN
15.0000 mL | Freq: Once | OROMUCOSAL | Status: AC
  Administered 2023-04-22: 15 mL via OROMUCOSAL

## 2023-04-22 MED ORDER — FENTANYL CITRATE (PF) 100 MCG/2ML IJ SOLN
INTRAMUSCULAR | Status: DC | PRN
  Administered 2023-04-22 (×4): 25 ug via INTRAVENOUS

## 2023-04-22 MED ORDER — PROPOFOL 1000 MG/100ML IV EMUL
INTRAVENOUS | Status: AC
  Filled 2023-04-22: qty 100

## 2023-04-22 MED ORDER — FENTANYL CITRATE (PF) 100 MCG/2ML IJ SOLN
25.0000 ug | INTRAMUSCULAR | Status: DC | PRN
  Administered 2023-04-22 (×4): 25 ug via INTRAVENOUS

## 2023-04-22 MED ORDER — FENTANYL CITRATE (PF) 100 MCG/2ML IJ SOLN
INTRAMUSCULAR | Status: AC
  Filled 2023-04-22: qty 2

## 2023-04-22 MED ORDER — BUPIVACAINE-EPINEPHRINE (PF) 0.25% -1:200000 IJ SOLN
INTRAMUSCULAR | Status: DC | PRN
  Administered 2023-04-22: 30 mL

## 2023-04-22 MED ORDER — PROPOFOL 500 MG/50ML IV EMUL
INTRAVENOUS | Status: DC | PRN
  Administered 2023-04-22: 125 ug/kg/min via INTRAVENOUS

## 2023-04-22 MED ORDER — PROPOFOL 10 MG/ML IV BOLUS
INTRAVENOUS | Status: DC | PRN
Start: 2023-04-22 — End: 2023-04-22
  Administered 2023-04-22: 200 mg via INTRAVENOUS

## 2023-04-22 MED ORDER — DEXAMETHASONE SODIUM PHOSPHATE 10 MG/ML IJ SOLN
INTRAMUSCULAR | Status: DC | PRN
  Administered 2023-04-22: 5 mg via INTRAVENOUS

## 2023-04-22 MED ORDER — ONDANSETRON HCL 4 MG/2ML IJ SOLN
INTRAMUSCULAR | Status: DC | PRN
  Administered 2023-04-22 (×2): 4 mg via INTRAVENOUS

## 2023-04-22 MED ORDER — PHENYLEPHRINE HCL-NACL 20-0.9 MG/250ML-% IV SOLN
INTRAVENOUS | Status: AC
  Filled 2023-04-22: qty 250

## 2023-04-22 MED ORDER — BUPIVACAINE-EPINEPHRINE (PF) 0.25% -1:200000 IJ SOLN
INTRAMUSCULAR | Status: AC
  Filled 2023-04-22: qty 30

## 2023-04-22 MED ORDER — ACETAMINOPHEN 500 MG PO TABS
1000.0000 mg | ORAL_TABLET | ORAL | Status: AC
Start: 2023-04-22 — End: 2023-04-22
  Administered 2023-04-22: 1000 mg via ORAL

## 2023-04-22 MED ORDER — DEXMEDETOMIDINE HCL IN NACL 200 MCG/50ML IV SOLN
INTRAVENOUS | Status: DC | PRN
Start: 2023-04-22 — End: 2023-04-22
  Administered 2023-04-22: 12 ug via INTRAVENOUS

## 2023-04-22 MED ORDER — CELECOXIB 200 MG PO CAPS
200.0000 mg | ORAL_CAPSULE | ORAL | Status: AC
  Administered 2023-04-22: 200 mg via ORAL

## 2023-04-22 MED ORDER — MIDAZOLAM HCL 2 MG/2ML IJ SOLN
INTRAMUSCULAR | Status: DC | PRN
  Administered 2023-04-22: 2 mg via INTRAVENOUS

## 2023-04-22 MED ORDER — CHLORHEXIDINE GLUCONATE 0.12 % MT SOLN
OROMUCOSAL | Status: AC
  Filled 2023-04-22: qty 15

## 2023-04-22 MED ORDER — PHENYLEPHRINE HCL-NACL 20-0.9 MG/250ML-% IV SOLN
INTRAVENOUS | Status: DC | PRN
  Administered 2023-04-22: 25 ug/min via INTRAVENOUS

## 2023-04-22 MED ORDER — DROPERIDOL 2.5 MG/ML IJ SOLN: 0.6250 mg | Freq: Once | INTRAMUSCULAR | Status: DC | PRN

## 2023-04-22 MED ORDER — BUPIVACAINE LIPOSOME 1.3 % IJ SUSP
20.0000 mL | Freq: Once | INTRAMUSCULAR | Status: DC
Start: 2023-04-22 — End: 2023-04-22

## 2023-04-22 MED ORDER — OXYCODONE HCL 5 MG PO TABS: 5.0000 mg | ORAL_TABLET | Freq: Four times a day (QID) | ORAL | 0 refills | Status: DC | PRN

## 2023-04-22 MED ORDER — ORAL CARE MOUTH RINSE: 15.0000 mL | Freq: Once | OROMUCOSAL | Status: AC

## 2023-04-22 MED ORDER — GABAPENTIN 300 MG PO CAPS
ORAL_CAPSULE | ORAL | Status: AC
  Filled 2023-04-22: qty 1

## 2023-04-22 MED ORDER — CEFAZOLIN SODIUM-DEXTROSE 2-4 GM/100ML-% IV SOLN
INTRAVENOUS | Status: AC
  Filled 2023-04-22: qty 100

## 2023-04-22 MED ORDER — CELECOXIB 200 MG PO CAPS
ORAL_CAPSULE | ORAL | Status: AC
  Filled 2023-04-22: qty 1

## 2023-04-22 MED ORDER — SODIUM CHLORIDE 0.9 % IV SOLN: INTRAVENOUS | Status: DC

## 2023-04-22 MED ORDER — TECHNETIUM TC 99M TILMANOCEPT KIT
1.1000 | PACK | Freq: Once | INTRAVENOUS | Status: AC | PRN
  Administered 2023-04-22: 1.1 via INTRADERMAL

## 2023-04-22 MED ORDER — FENTANYL CITRATE (PF) 100 MCG/2ML IJ SOLN
INTRAMUSCULAR | Status: AC
Start: 2023-04-22 — End: ?
  Filled 2023-04-22: qty 2

## 2023-04-22 MED ORDER — CEFAZOLIN SODIUM-DEXTROSE 2-3 GM-%(50ML) IV SOLR
INTRAVENOUS | Status: DC | PRN
  Administered 2023-04-22: 2 g via INTRAVENOUS

## 2023-04-22 MED ORDER — GLYCOPYRROLATE 0.2 MG/ML IJ SOLN
INTRAMUSCULAR | Status: DC | PRN
  Administered 2023-04-22: .2 mg via INTRAVENOUS

## 2023-04-22 SURGICAL SUPPLY — 35 items
APPLIER CLIP 9.375 SM OPEN (CLIP) IMPLANT
BLADE SURG 15 STRL LF DISP TIS (BLADE) ×2 IMPLANT
CHLORAPREP W/TINT 26 (MISCELLANEOUS) ×2 IMPLANT
CLIP APPLIE 9.375 SM OPEN (CLIP) IMPLANT
CNTNR URN SCR LID CUP LEK RST (MISCELLANEOUS) IMPLANT
COVER PROBE GAMMA FINDER SLV (MISCELLANEOUS) ×2 IMPLANT
DERMABOND ADVANCED .7 DNX12 (GAUZE/BANDAGES/DRESSINGS) ×2 IMPLANT
DEVICE DUBIN SPECIMEN MAMMOGRA (MISCELLANEOUS) ×2 IMPLANT
DISSECTOR SURG LIGASURE 21 (MISCELLANEOUS) IMPLANT
DRAPE LAPAROTOMY TRNSV 106X77 (MISCELLANEOUS) ×2 IMPLANT
ELECT CAUTERY BLADE TIP 2.5 (TIP) ×2 IMPLANT
ELECT REM PT RETURN 9FT ADLT (ELECTROSURGICAL) ×2 IMPLANT
ELECTRODE CAUTERY BLDE TIP 2.5 (TIP) ×2 IMPLANT
ELECTRODE REM PT RTRN 9FT ADLT (ELECTROSURGICAL) ×2 IMPLANT
GAUZE 4X4 16PLY ~~LOC~~+RFID DBL (SPONGE) ×2 IMPLANT
GLOVE ORTHO TXT STRL SZ7.5 (GLOVE) ×2 IMPLANT
GOWN STRL REUS W/ TWL LRG LVL3 (GOWN DISPOSABLE) ×2 IMPLANT
GOWN STRL REUS W/ TWL XL LVL3 (GOWN DISPOSABLE) ×2 IMPLANT
KIT MARKER MARGIN INK (KITS) IMPLANT
KIT TURNOVER KIT A (KITS) ×2 IMPLANT
MANIFOLD NEPTUNE II (INSTRUMENTS) ×2 IMPLANT
NDL HYPO 22X1.5 SAFETY MO (MISCELLANEOUS) ×2 IMPLANT
NEEDLE HYPO 22X1.5 SAFETY MO (MISCELLANEOUS) ×2 IMPLANT
PACK BASIN MINOR ARMC (MISCELLANEOUS) ×2 IMPLANT
SHEATH BREAST BIOPSY SKIN MKR (SHEATH) ×2 IMPLANT
SPIKE FLUID TRANSFER (MISCELLANEOUS) ×2 IMPLANT
SUT MNCRL 4-0 27 PS-2 XMFL (SUTURE) ×2 IMPLANT
SUT VIC AB 3-0 SH 27X BRD (SUTURE) ×2 IMPLANT
SUTURE MNCRL 4-0 27XMF (SUTURE) ×2 IMPLANT
SYR 10ML LL (SYRINGE) ×2 IMPLANT
SYR 20ML LL LF (SYRINGE) ×2 IMPLANT
TRAP FLUID SMOKE EVACUATOR (MISCELLANEOUS) ×2 IMPLANT
TRAP NEPTUNE SPECIMEN COLLECT (MISCELLANEOUS) ×2 IMPLANT
WATER STERILE IRR 1000ML POUR (IV SOLUTION) ×2 IMPLANT
WATER STERILE IRR 500ML POUR (IV SOLUTION) ×2 IMPLANT

## 2023-04-22 NOTE — Anesthesia Procedure Notes (Addendum)
Procedure Name: LMA Insertion Date/Time: 04/22/2023 9:10 AM  Performed by: Mohammed Kindle, CRNAPre-anesthesia Checklist: Patient identified, Emergency Drugs available, Suction available and Patient being monitored Patient Re-evaluated:Patient Re-evaluated prior to induction Oxygen Delivery Method: Circle system utilized Preoxygenation: Pre-oxygenation with 100% oxygen Induction Type: IV induction Ventilation: Mask ventilation without difficulty LMA: LMA inserted LMA Size: 4.0 Placement Confirmation: positive ETCO2 and breath sounds checked- equal and bilateral Tube secured with: Tape Dental Injury: Teeth and Oropharynx as per pre-operative assessment

## 2023-04-22 NOTE — Transfer of Care (Signed)
Immediate Anesthesia Transfer of Care Note  Patient: Bianca Rogers  Procedure(s) Performed: BREAST LUMPECTOMY WITH RADIOFREQUENCY TAG IDENTIFICATION (Left: Breast) AXILLARY SENTINEL NODE BIOPSY (Left)  Patient Location: PACU  Anesthesia Type:General  Level of Consciousness: awake, drowsy, and patient cooperative  Airway & Oxygen Therapy: Patient Spontanous Breathing and Patient connected to face mask oxygen  Post-op Assessment: Report given to RN and Post -op Vital signs reviewed and stable  Post vital signs: Reviewed and stable  Last Vitals:  Vitals Value Taken Time  BP 133/57 04/22/23 1115  Temp 37.2 C 04/22/23 1111  Pulse 94 04/22/23 1120  Resp 12 04/22/23 1120  SpO2 100 % 04/22/23 1120  Vitals shown include unfiled device data.  Last Pain:  Vitals:   04/22/23 0819  TempSrc: Temporal  PainSc: 0-No pain         Complications: No notable events documented.

## 2023-04-22 NOTE — Op Note (Signed)
Pre-operative Diagnosis: Breast Cancer, Invasive, Left, ER+     Post-operative Diagnosis: Same  Surgeon: Campbell Lerner, M.D., FACS  Anesthesia: Gen LMA  Procedure: Left Lumpectomy, Raylene Everts Scout tag directed, sentinel lymph node biopsy  Procedure Details  The patient was seen again in the Holding Room. The benefits, complications, treatment options, and expected outcomes were discussed with the patient. The risks of bleeding, infection, recurrence of symptoms, failure to resolve symptoms, hematoma, seroma, open wound, cosmetic deformity, and the need for further surgery were discussed.  The patient was taken to Operating Room, identified as Bianca Rogers and the procedure verified.  A Time Out was held and the above information confirmed.  Prior to the induction of general anesthesia, antibiotic prophylaxis was administered. VTE prophylaxis was in place. The patient was positioned in the supine position. Appropriate anesthesia was then administered and tolerated well.  The [facility] probe confirmed the site of shortest distance and highest cadence and that site is marked for incision.  The breast is secured with tape as needed to minimize motion during the procedure.  A visual dye Methylene Blue 5 ml was injected periareolar dermis early under aseptic conditions.  Massage was administered to this area for 5 minutes prior to securely taping it.  The chest was prepped with Chloraprep and draped in the sterile fashion.  Then using the hand-held probe an area of high counts was identified in the axilla, an incision was made and direction by the probe aided in dissection of a lymph node which was sent for permanent section.  1 node was clearly blue with the highest count.  There is some adjacent small lymph nodes spotted blue, with significantly less counts, yet counts were too high to call background.  So an additional 4-6 lymph nodes were obtained and sent as additional specimen.  No additional blue  lymphatics, background counts acceptable.  Attention was turned to the [facility] localization site where an incision was made. Dissection using the Scout to perform a lumpectomy with adequate margins was performed. This was done with sharp dissection with scissors. Hemostasis is controlled where the risk of damaging the tag is diminished, and the cavity packed.  The specimen was taken to the back table and painted to demarcate the 6 surfaces of potential margin.   I returned to the cavity to remove the packing, and additional hemostasis was confirmed with electrocautery.   The Faxitron imaging confirmed my suspicion that my closest margin is superior.  I then proceeded to obtain additional superior and limited additional posterior margin.  This was then taken to the back table and painted red on the superior margin, and black on the posterior portion.  Once assuring that hemostasis was adequate and checked multiple times being irrigated with water the wound was closed with interrupted 3-0 Vicryl followed by 4-0 subcuticular Monocryl sutures.  The axillary wound was closed in a similar fashion with 3-0 Vicryl dermal's.  Dermabond is utilized to seal the incisions.  Local anesthesia of 0.25% Marcaine with epinephrine is infiltrated into the cavity.   Findings: Faxitron imaging: Confirmed the presence of the coil clip, and the Savi scout.  It appeared consistent with a superior margin as it was placed in the container.  Estimated Blood Loss: Minimal         Drains: None         Specimens: Left axillary sentinel lymph node, additional axillary lymph nodes.  Inferior left breast tissue, with additional superior and posterior margin.  Complications: None         Condition: Stable  Sentinel Node Biopsy Synoptic Operative Report  Operation performed with curative intent:Yes  Tracer(s) used to identify sentinel nodes in the upfront surgery (non-neoadjuvant) setting (select all that apply):Dye  and Radioactive Tracer  Tracer(s) used to identify sentinel nodes in the neoadjuvant setting (select all that apply):N/A  All nodes (colored or non-colored) present at the end of a dye-filled lymphatic channel were removed:Yes   All significantly radioactive nodes were removed:Yes  All palpable suspicious nodes were removed:Yes  Biopsy-proven positive nodes marked with clips prior to chemotherapy were identified and removed:N/A    Campbell Lerner, M.D., [facility]  04/22/2023 ; 11:19 AM

## 2023-04-22 NOTE — Anesthesia Postprocedure Evaluation (Signed)
Anesthesia Post Note  Patient: Bianca Rogers  Procedure(s) Performed: BREAST LUMPECTOMY WITH RADIOFREQUENCY TAG IDENTIFICATION (Left: Breast) AXILLARY SENTINEL NODE BIOPSY (Left)  Patient location during evaluation: PACU Anesthesia Type: General Level of consciousness: awake and alert Pain management: pain level controlled Vital Signs Assessment: post-procedure vital signs reviewed and stable Respiratory status: spontaneous breathing, nonlabored ventilation, respiratory function stable and patient connected to nasal cannula oxygen Cardiovascular status: blood pressure returned to baseline and stable Postop Assessment: no apparent nausea or vomiting Anesthetic complications: no   No notable events documented.   Last Vitals:  Vitals:   04/22/23 1247 04/22/23 1249  BP:    Pulse:  84  Resp: 14 14  Temp: 37 C 36.8 C  SpO2:  98%    Last Pain:  Vitals:   04/22/23 1249  TempSrc: Temporal  PainSc:                  Lenard Simmer

## 2023-04-22 NOTE — Anesthesia Preprocedure Evaluation (Signed)
 Anesthesia Evaluation  Patient identified by MRN, date of birth, ID band Patient awake    Reviewed: Allergy & Precautions, H&P , NPO status , Patient's Chart, lab work & pertinent test results, reviewed documented beta blocker date and time   History of Anesthesia Complications Negative for: history of anesthetic complications  Airway Mallampati: III  TM Distance: >3 FB Neck ROM: full    Dental  (+) Dental Advidsory Given, Caps, Teeth Intact Several permanent bridges:   Pulmonary shortness of breath and with exertion, asthma , neg sleep apnea, neg COPD, neg recent URI, former smoker bronchiectasis   Pulmonary exam normal breath sounds clear to auscultation       Cardiovascular Exercise Tolerance: Good hypertension, (-) angina (-) CAD, (-) Past MI and (-) Cardiac Stents Normal cardiovascular exam(-) dysrhythmias + Valvular Problems/Murmurs  Rhythm:regular Rate:Normal     Neuro/Psych  PSYCHIATRIC DISORDERS  Depression    negative neurological ROS     GI/Hepatic ,GERD  Controlled,,(+) Cirrhosis       NASH   Endo/Other  diabetes, Type 2    Renal/GU negative Renal ROS  negative genitourinary   Musculoskeletal   Abdominal   Peds  Hematology negative hematology ROS (+)   Anesthesia Other Findings Past Medical History: 12/09/2016: Anal fissure     Comment:  Added automatically from request for surgery [id number]                  Added automatically from request for surgery [id number]   12/03/2020: Arthralgia of left knee 08/29/2022: Asthma     Comment:  Chronic/stable.    Follows with Dr. Westley Hummer,               pulmonology.   Using Flovent and albuterol inhaler as               needed.   12/11/2015: Bartholin cyst     Comment:  Added automatically from request for surgery [id number]   No date: Bronchiectasis (HCC) 04/02/2020: Carotid artery disease (HCC) 01/09/2022: Cervical radiculitis 02/28/2021: COVID No date:  Diabetes mellitus without complication (HCC) 06/09/2013: Dyspnea 03/02/2023: Encounter to establish care No date: GERD (gastroesophageal reflux disease) 10/18/2020: H/O splenectomy     Comment:  2021.   No date: Heart murmur 08/21/2021: History of total knee arthroplasty 08/29/2022: Hyperlipidemia     Comment:  Lab Results   Component Value Date    Cholesterol 197               01/28/2022     Lab Results   Component Value Date    HDL              56 01/28/2022     Lab Results   Component Value Date                  LDL Calculated 115 01/28/2022     Lab Results                 Component Value Date    Triglycerides 131 01/28/2022                  Lab Results   Component Value Date    Chol/HDL Ratio 3.5               01/28/2022     Criteria used to determine recommendation              include:  No date: Hypertension No date: Invasive carcinoma of breast (  HCC) No date: Liver cirrhosis secondary to NASH [facility]) No date: Lung nodule 04/02/2020: Major depressive disorder in full remission (HCC)     Comment:  Mood stable on venlafaxine 150 mg daily.   No date: 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 breast of  female, estrogen receptor positive (HCC) 08/29/2022: Migraine 09/29/2012: NAFLD (nonalcoholic fatty liver disease) No date: Non-alcoholic cirrhosis (HCC) 09/03/2020: Osteoarthritis of left knee 07/07/2019: Peritoneal hematoma 09/03/2020: Sprain of MCL (medial collateral ligament) of knee 07/09/2010: Symptom associated with female genital organs   Reproductive/Obstetrics negative OB ROS                             Anesthesia Physical Anesthesia Plan  ASA: 3  Anesthesia Plan: General   Post-op Pain Management:    Induction: Intravenous  PONV Risk Score and Plan: 3 and Propofol infusion, TIVA and Treatment may vary due to age or medical condition  Airway Management Planned: LMA  Additional Equipment:   Intra-op Plan:    Post-operative Plan: Extubation in OR  Informed Consent: I have reviewed the patients History and Physical, chart, labs and discussed the procedure including the risks, benefits and alternatives for the proposed anesthesia with the patient or authorized representative who has indicated his/her understanding and acceptance.     Dental Advisory Given  Plan Discussed with: Anesthesiologist, CRNA and Surgeon  Anesthesia Plan Comments:        Anesthesia Quick Evaluation

## 2023-04-22 NOTE — Interval H&P Note (Signed)
History and Physical Interval Note:  04/22/2023 8:50 AM  Bianca Rogers  has presented today for surgery, with the diagnosis of left breast cancer.  The various methods of treatment have been discussed with the patient and family. After consideration of risks, benefits and other options for treatment, the patient has consented to  Procedure(s): BREAST LUMPECTOMY WITH RADIOFREQUENCY TAG IDENTIFICATION (Left) AXILLARY SENTINEL NODE BIOPSY (Left) as a surgical intervention.  The patient's history has been reviewed, patient examined, no change in status, stable for surgery.  I have reviewed the patient's chart and labs.  Questions were answered to the patient's satisfaction.     Campbell Lerner

## 2023-04-23 ENCOUNTER — Encounter: Payer: Self-pay | Admitting: Surgery

## 2023-04-27 NOTE — Progress Notes (Unsigned)
 [facility] POST-OP OFFICE VISIT  04/28/2023  HPI: Bianca Rogers is a 73 y.o. 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    SURGICAL PATHOLOGY [facility] [address], [address] [place], [place] [address] Telephone [phone] or [phone] Fax [phone]  REPORT OF SURGICAL PATHOLOGY   Accession #: [id number] Patient Name: Bianca Rogers, Bianca Rogers Visit # : [id number]  MRN: [id number] Physician: Campbell Lerner DOB/Age Apr 29, 1950 (Age: 85) Gender: F Collected Date: 04/22/2023 Received Date: 04/22/2023  FINAL DIAGNOSIS       1. Lymph node, sentinel, biopsy, #1 :      ONE LYMPH NODE, NEGATIVE FOR METASTATIC CARCINOMA (0/1).       2. 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 Lymph node, sentinel, biopsy, #2,3.4.5,6,7 :      FIVE LYMPH NODES, NEGATIVE FOR METASTATIC CARCINOMA (0/5).       4. Breast, lumpectomy, Left, additional superior and postreior margins :      DUCTAL CARCINOMA IN SITU, HIGH GRADE.      DCIS INVOLVES INKED BLACK MARGIN / NEW POSTERIOR MARGIN.      NEW SUPERIOR MARGIN IS NEGATIVE FOR CARCINOMA.  Vital signs: BP (!) 145/76   Pulse 86   Temp 98.4 F (36.9 C) (Oral)   Ht 5\' 4"  (1.626  m)   Wt 158 lb (71.7 kg)   SpO2 97%   BMI 27.12 kg/m    Physical Exam: Constitutional: She appears well, at her baseline. Chest: Clear to auscultation. Heart: Regular rate and rhythm. Breast: Her left breast has an inferior based incision scar that appears to be healing nicely.  It is well hidden in the inframammary crease.  There is no adjacent hematoma, seroma or mass effect present.  Left axillary incision appears to be healing nicely well sealed with Dermabond.  No significant underlying hematoma or edema present no appreciable tenderness, no difficulty with range of motion.  Assessment/Plan: This is a 73 y.o. female excision of invasive carcinoma from inferior middle left breast, sentinel node negative.  However DCIS seems to extend to the deep/posterior margin.  Patient Active Problem List   Diagnosis Date Noted   Change in facial mole 04/17/2023   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 breast of female, estrogen receptor positive (HCC) 04/09/2023   Invasive carcinoma of breast (HCC) 04/08/2023   Family history of breast cancer 04/08/2023  Gastroesophageal reflux disease 03/02/2023   Liver cirrhosis secondary to NASH (HCC) 03/02/2023   Bronchiectasis (HCC) 06/09/2013   Type 2 diabetes mellitus with diabetic neuropathy (HCC) 06/09/2013   HTN (hypertension), benign 06/09/2013    -Will follow-up await result from Oncotype.   -Would like to proceed with referral to radiation oncology, I am anticipating the need for reexcision with focus on the deep margin.  They would like to wait until after seeing oncology, and consideration of avoiding additional surgery if acceptable by radiation oncology. As of now I will pencil her in for reexcision procedure, after our next breast cancer conference.   Campbell Lerner M.D., FACS 04/28/2023, 11:37 AM

## 2023-04-28 ENCOUNTER — Encounter: Payer: Self-pay | Admitting: *Deleted

## 2023-04-28 ENCOUNTER — Ambulatory Visit (INDEPENDENT_AMBULATORY_CARE_PROVIDER_SITE_OTHER): Payer: Medicare HMO | Admitting: Surgery

## 2023-04-28 ENCOUNTER — Encounter: Payer: Self-pay | Admitting: Surgery

## 2023-04-28 ENCOUNTER — Ambulatory Visit: Payer: Self-pay | Admitting: Surgery

## 2023-04-28 ENCOUNTER — Other Ambulatory Visit: Payer: Self-pay

## 2023-04-28 VITALS — BP 145/76 | HR 86 | Temp 98.4°F | Ht 64.0 in | Wt 158.0 lb

## 2023-04-28 DIAGNOSIS — C50512 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 female breast: Secondary | ICD-10-CM

## 2023-04-28 DIAGNOSIS — Z170 Estrogen receptor positive status [ER+]: Secondary | ICD-10-CM

## 2023-04-28 DIAGNOSIS — Z09 Encounter for follow-up examination after completed treatment for conditions other than malignant neoplasm: Secondary | ICD-10-CM

## 2023-04-28 LAB — SURGICAL PATHOLOGY

## 2023-04-28 NOTE — Progress Notes (Signed)
 Oncotype Dx order submitted online.   Patient is scheduled for re-excision on 3/12.   Her appt. With Dr. Cathie Hoops has been r/s'd to 3/19, appt. Details given to her.   She is meeting with Dr. Rushie Chestnut on 3/4 to discuss radiation vs. Re-excision as well and she has been added for discussion on the breast MDC.

## 2023-04-28 NOTE — Patient Instructions (Signed)
Lumpectomy, Care After The following information offers guidance on how to care for yourself after your procedure. Your health care provider may also give you more specific instructions. If you have problems or questions, contact your health care provider. What can I expect after the procedure? After the procedure, it is common to have: Some pain or redness at the incision site. Breast swelling. Breast tenderness. Stiffness in your arm or shoulder. A change in the shape and feel of your breast. Scar tissue that feels hard to the touch in the area where the lump was removed. Follow these instructions at home: Medicines Take over-the-counter and prescription medicines only as told by your health care provider. If you were prescribed an antibiotic, take it as told by your health care provider. Do not stop taking the antibiotic even if you start to feel better. Ask your health care provider if the medicine prescribed to you: Requires you to avoid driving or using machinery. Can cause constipation. You may need to take these actions to prevent or treat constipation: Drink enough fluid to keep your urine pale yellow. Take over-the-counter or prescription medicines. Eat foods that are high in fiber, such as beans, whole grains, and fresh fruits and vegetables. Limit foods that are high in fat and processed sugars, such as fried or sweet foods. Incision care     Follow instructions from your health care provider about how to take care of your incision. Make sure you: Wash your hands with soap and water for at least 20 seconds before and after you change your bandage (dressing). If soap and water are not available, use hand sanitizer. Change your dressing as told by your health care provider. Leave stitches (sutures), skin glue, or adhesive strips in place. These skin closures may need to stay in place for 2 weeks or longer. If adhesive strip edges start to loosen and curl up, you may trim the  loose edges. Do not remove adhesive strips completely unless your health care provider tells you to do that. Check your incision area every day for signs of infection. Check for: More redness, swelling, or pain. Fluid or blood. Warmth. Pus or a bad smell. Keep your dressing clean and dry. If you were sent home with a surgical drain in place, follow instructions from your health care provider about emptying it. Bathing Do not take baths, swim, or use a hot tub until your health care provider approves. Ask your health care provider if you may take showers. You may only be allowed to take sponge baths. Activity Rest as told by your health care provider. Do not sit for a long time without moving. Get up to take short walks every 1-2 hours. This will improve blood flow and breathing. Ask for help if you feel weak or unsteady. Be careful to avoid any activities that could cause an injury to your arm on the side of your surgery. Do not lift anything that is heavier than 10 lb (4.5 kg), or the limit that you are told, until your health care provider says that it is safe. Avoid lifting with the arm that is on the side of your surgery. Do not carry heavy objects on your shoulder on the side of your surgery. Do exercises to keep your shoulder and arm from getting stiff and swollen. Talk with your health care provider about which exercises are safe for you. Return to your normal activities as told by your health care provider. Ask your health care provider what activities  are safe for you. General instructions Wear a supportive bra as told by your health care provider. Raise (elevate) your arm above the level of your heart while you are sitting or lying down. Do not wear tight jewelry on your arm, wrist, or fingers on the side of your surgery. Wear compression stockings as told by your health care provider. These stockings help to prevent blood clots and reduce swelling in your legs. If you had any lymph  nodes removed during your procedure, be sure to tell all of your health care providers. It is important to share this information before you have certain procedures, such as blood tests or blood pressure measurements. Keep all follow-up visits. You may need to be screened for extra fluid around the lymph nodes and swelling in the breast and arm (lymphedema). Contact a health care provider if: You develop a rash. You have a fever. Your pain worsens or pain medicine is not working. You have swelling, weakness, or numbness in your arm that does not improve after a few weeks. You have new swelling in your breast. You have any of these signs of infection: More redness, swelling, or pain in your incision area. Fluid or blood coming from your incision. Warmth coming from the incision area. Pus or a bad smell coming from your incision. Get help right away if: You have very bad pain in your breast or arm. You have swelling in your legs or arms. You have redness, warmth, or pain in your leg or arm. You have chest pain. You have difficulty breathing. These symptoms may be an emergency. Get help right away. Call 911. Do not wait to see if the symptoms will go away. Do not drive yourself to the hospital. Summary After the procedure, it is common to have breast tenderness, swelling in your breast, and stiffness in your arm and shoulder. Follow instructions from your health care provider about how to take care of your incision. Do not lift anything that is heavier than 10 lb (4.5 kg), or the limit that you are told, until your health care provider says that it is safe. Avoid lifting with the arm that is on the side of your surgery. If you had any lymph nodes removed during your procedure, be sure to tell all of your health care providers. This information is not intended to replace advice given to you by your health care provider. Make sure you discuss any questions you have with your health care  provider. Document Revised: 05/05/2021 Document Reviewed: 05/05/2021 Elsevier Patient Education  2024 [employer].

## 2023-04-29 ENCOUNTER — Telehealth: Payer: Self-pay | Admitting: Surgery

## 2023-04-29 NOTE — Telephone Encounter (Signed)
 Patient has been advised of Pre-Admission date/time, and Surgery date at [facility].  Surgery Date: 05/20/23 Preadmission Testing Date: 05/13/23 (phone 8a-1p)  Patient has been made aware to call [phone], between 1-3:00pm the day before surgery, to find out what time to arrive for surgery.

## 2023-05-04 ENCOUNTER — Ambulatory Visit
Admission: RE | Admit: 2023-05-04 | Discharge: 2023-05-04 | Disposition: A | Discharge status: Home / Self Care | Payer: Medicare HMO | Source: Ambulatory Visit | Attending: Gastroenterology | Admitting: Gastroenterology

## 2023-05-04 ENCOUNTER — Other Ambulatory Visit: Payer: Medicare HMO

## 2023-05-04 DIAGNOSIS — K7581 Nonalcoholic steatohepatitis (NASH): Secondary | ICD-10-CM | POA: Diagnosis present

## 2023-05-04 DIAGNOSIS — K7460 Unspecified cirrhosis of liver: Secondary | ICD-10-CM | POA: Diagnosis present

## 2023-05-04 NOTE — Progress Notes (Signed)
 Tumor Board Documentation  Bianca Rogers was presented by Irving Shows, RN at our Tumor Board on 05/04/2023, which included representatives from medical oncology, surgical oncology, pathology, navigation, palliative care, research.  Bianca Rogers currently presents as a current patient with history of the following treatments: surgical intervention(s).  Additionally, we reviewed previous medical and familial history, history of present illness, and recent lab results along with all available histopathologic and imaging studies. The tumor board considered available treatment options and made the following recommendations: Surgery, Adjuvant radiation Re-excision recommended for margin positive with focal area of DCIS.   Oncotype testing pending.  The following procedures/referrals were also placed: No orders of the defined types were placed in this encounter.   Clinical Trial Status: not discussed   Staging used: AJCC Staging: T: T1B N: N0        National site-specific guidelines   were discussed with respect to the case.  Tumor board is a meeting of clinicians from various specialty areas who evaluate and discuss patients for whom a multidisciplinary approach is being considered. Final determinations in the plan of care are those of the provider(s). The responsibility for follow up of recommendations given during tumor board is that of the provider.   Today's extended care, comprehensive team conference, Bianca Rogers was not present for the discussion and was not examined.   Multidisciplinary Tumor Board is a multidisciplinary case peer review process.  Decisions discussed in the Multidisciplinary Tumor Board reflect the opinions of the specialists present at the conference without having examined the patient.  Ultimately, treatment and diagnostic decisions rest with the primary provider(s) and the patient.

## 2023-05-05 ENCOUNTER — Telehealth: Payer: Self-pay

## 2023-05-05 NOTE — Telephone Encounter (Signed)
 Patient verbalized understanding of results and put a reminder for 6 months

## 2023-05-05 NOTE — Telephone Encounter (Signed)
-----   Message from [facility] sent at 05/05/2023 10:44 AM EST ----- Ultrasound liver revealed cirrhosis with no evidence of liver lesions Recommend repeat ultrasound right upper quadrant for HCC screening in 6 months  RV

## 2023-05-08 ENCOUNTER — Telehealth: Payer: Self-pay | Admitting: Genetic Counselor

## 2023-05-12 ENCOUNTER — Encounter: Payer: Self-pay | Admitting: Radiation Oncology

## 2023-05-12 ENCOUNTER — Ambulatory Visit
Admission: RE | Admit: 2023-05-12 | Discharge: 2023-05-12 | Disposition: A | Discharge status: Home / Self Care | Payer: Medicare HMO | Source: Ambulatory Visit | Attending: Radiation Oncology | Admitting: Radiation Oncology

## 2023-05-12 VITALS — BP 130/63 | HR 77 | Resp 16 | Ht 64.0 in | Wt 160.2 lb

## 2023-05-12 DIAGNOSIS — C50512 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 female breast: Secondary | ICD-10-CM | POA: Insufficient documentation

## 2023-05-12 DIAGNOSIS — Z170 Estrogen receptor positive status [ER+]: Secondary | ICD-10-CM | POA: Diagnosis present

## 2023-05-12 NOTE — Consult Note (Signed)
 NEW PATIENT EVALUATION  Name: Bianca Rogers  MRN: [id number]  Date:   05/12/2023     DOB: 11-16-1950   This 73 y.o. female patient presents to the clinic for initial evaluation of pathologic stage Ia (pT1b N0 M0) grade 3 ER/PR positive HER2 negative invasive mammary carcinoma of the left breast status post wide local excision with positive margin for DCIS requiring reexcision  REFERRING PHYSICIAN: Campbell Lerner, MD  CHIEF COMPLAINT:  Chief Complaint  Patient presents with   Consult    DIAGNOSIS: The encounter diagnosis was 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 female breast, unspecified estrogen receptor status (HCC).   PREVIOUS INVESTIGATIONS:  Pathology reports reviewed CT scans MRI scans and mammograms reviewed Clinical notes reviewed  HPI: Patient is a 73 year old female who presented abnormal mammograms of herLeft breast.  There was a 0.8 cm suspicious mass in the lower outer quadrant left breast.  No abnormal appearing left axillary lymph nodes were noted.  She underwent biopsy which was positive for invasive mammary carcinoma.  MRI of her breast showed 0.8 cm malignancy within the posterior left lower breast no other suspicious abnormalities within the breast no abnormal appearing lymph nodes were noted.  She is also had a CT scan of her chest showing some mild tubular bronchiectasis in the lung bases.  She underwent a wide local excision and sentinel node biopsy for grade 3 ER/PR positive invasive mammary carcinoma measuring 1 cm.  Posterior margin was positive for DCIS.  6 sentinel lymph nodes were examined all negative for malignancy.  She is scheduled for reexcision after tumor board recommended based on the positive DCIS margin.  She is seen today and doing well has some slight breast tenderness.  Otherwise specifically denies bone pain or cough.  She has had an Oncotype DX ordered which is pending.  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), Heart murmur, History of total knee arthroplasty (08/21/2021), Hyperlipidemia (08/29/2022), Hypertension, Invasive carcinoma of breast (HCC), Liver cirrhosis secondary to NASH [facility]), Lung nodule, 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    PAST SURGICAL HISTORY:  Past Surgical History:  Procedure Laterality Date   ABDOMINAL HYSTERECTOMY     AXILLARY SENTINEL NODE BIOPSY Left 04/22/2023   Procedure: AXILLARY SENTINEL NODE BIOPSY;  Surgeon: Campbell Lerner, MD;  Location: [facility] ORS;  Service: General;  Laterality: Left;   BREAST BIOPSY Left 03/31/2023   [place] Core bx, coil clip - path pending   BREAST BIOPSY Left 03/31/2023   [place] LT BREAST BX W LOC DEV 1ST LESION IMG BX SPEC US GUIDE 03/31/2023 [facility]   BREAST BIOPSY Left 04/20/2023   [place] LT RADIO FREQUENCY TAG LOC US GUIDE 04/20/2023 [facility]   BREAST LUMPECTOMY WITH RADIOFREQUENCY TAG IDENTIFICATION Left 04/22/2023   Procedure: BREAST LUMPECTOMY WITH RADIOFREQUENCY TAG IDENTIFICATION;  Surgeon: Campbell Lerner, MD;  Location: [facility] ORS;   Service:  General;  Laterality: Left;   CARDIAC CATHETERIZATION  03/10/2012   Negative   CATARACT EXTRACTION Bilateral    CESAREAN SECTION     CHOLECYSTECTOMY     SPLENECTOMY     TONSILLECTOMY      FAMILY HISTORY: family history includes Arthritis in her father, mother, paternal grandfather, and sister; Asthma in her father; Birth defects in her sister; Breast cancer (age of onset: 49) in her niece; Breast cancer (age of onset: 34) in her daughter; Breast cancer (age of onset: 36) in her sister; COPD in her father; Cancer in her paternal grandfather and sister; Cancer (age of onset: 39) in her father; Cervical cancer in her daughter; Depression in her father; Diabetes in her paternal grandmother and sister; Early death in her father; Hearing loss in her father and mother; Heart disease in her father; High Cholesterol in her father and mother; High blood pressure in her mother and sister; Miscarriages / Stillbirths in her mother; Stroke in her mother; Thyroid disease in her mother; Transient ischemic attack in her mother.  SOCIAL HISTORY:  reports that she quit smoking about 39 years ago. Her smoking use included cigarettes. She started smoking about 47 years ago. She has a 14.5 pack-year smoking history. She has never used smokeless tobacco. She reports that she does not drink alcohol and does not use drugs.  ALLERGIES: Penicillins, Codeine, and Zoloft [sertraline hcl]  MEDICATIONS:  Current Outpatient Medications  Medication Sig Dispense Refill   albuterol (PROVENTIL HFA) 108 (90 BASE) MCG/ACT inhaler Inhale 1 puff into the lungs every 6 (six) hours as needed for wheezing or shortness of breath.     Calcium Carbonate-Vitamin D (OYSTER SHELL CALCIUM/D) 500-5 MG-MCG TABS Take 1 tablet by mouth daily.     clobetasol cream (TEMOVATE) 0.05 % Apply 1 Application topically 2 (two) times daily. 60 g 0   EFFEXOR XR 150 MG 24 hr capsule Take 150 mg by mouth daily with breakfast.     ezetimibe (ZETIA) 10  MG tablet Take 10 mg by mouth daily.     gabapentin (NEURONTIN) 100 MG capsule Take 1 capsule (100 mg total) by mouth 2 (two) times daily. 120 capsule 0   lisinopril-hydrochlorothiazide (ZESTORETIC) 20-12.5 MG tablet Take 1 tablet by mouth daily.     loratadine (CLARITIN) 10 MG tablet Take 10 mg by mouth daily as needed for allergies.     metFORMIN (GLUCOPHAGE-XR) 500 MG 24 hr tablet Take 1,000 mg by mouth 2 (two) times daily with a meal.     omeprazole (PRILOSEC) 20 MG capsule Take 1 capsule (20 mg total) by mouth daily. 90 capsule 0   pravastatin (PRAVACHOL) 80 MG tablet Take 80 mg by mouth daily.     No current facility-administered medications for this encounter.    ECOG PERFORMANCE STATUS:  0 - Asymptomatic  REVIEW OF SYSTEMS: Patient denies any weight loss, fatigue, weakness, fever, chills or night sweats. Patient denies any loss of vision, blurred vision. Patient denies any ringing  of the ears or hearing loss. No irregular heartbeat. Patient denies heart murmur or history of fainting. Patient denies any chest pain or pain radiating to her upper extremities. Patient denies any shortness of breath, difficulty breathing at night, cough or hemoptysis. Patient denies any swelling in the lower legs. Patient denies any nausea vomiting, vomiting of blood, or coffee ground material in the vomitus. Patient denies any stomach pain. Patient states has had normal bowel movements no significant constipation or diarrhea. Patient denies any dysuria,  hematuria or significant nocturia. Patient denies any problems walking, swelling in the joints or loss of balance. Patient denies any skin changes, loss of hair or loss of weight. Patient denies any excessive worrying or anxiety or significant depression. Patient denies any problems with insomnia. Patient denies excessive thirst, polyuria, polydipsia. Patient denies any swollen glands, patient denies easy bruising or easy bleeding. Patient denies any recent  infections, allergies or URI. Patient "s visual fields have not changed significantly in recent time.   PHYSICAL EXAM: BP 130/63   Pulse 77   Resp 16   Ht 5\' 4"  (1.626 m)   Wt 160 lb 3.2 oz (72.7 kg)   BMI 27.50 kg/m  She is status post wide local excision and sentinel biopsy of her left breast both incisions are healing well.  No dominant masses noted in either breast.  No axillary or supraclavicular adenopathy is appreciated.  Well-developed well-nourished patient in NAD. HEENT reveals PERLA, EOMI, discs not visualized.  Oral cavity is clear. No oral mucosal lesions are identified. Neck is clear without evidence of cervical or supraclavicular adenopathy. Lungs are clear to A&P. Cardiac examination is essentially unremarkable with regular rate and rhythm without murmur rub or thrill. Abdomen is benign with no organomegaly or masses noted. Motor sensory and DTR levels are equal and symmetric in the upper and lower extremities. Cranial nerves II through XII are grossly intact. Proprioception is intact. No peripheral adenopathy or edema is identified. No motor or sensory levels are noted. Crude visual fields are within normal range.  LABORATORY DATA: Pathology reports reviewed    RADIOLOGY RESULTS: Mammogram and ultrasound and CT scan and MRI scan all reviewed compatible with above-stated findings   IMPRESSION: Stage Ib invasive mammary carcinoma of the left breast status post wide local excision and sentinel node biopsy with positive deep margin for DCIS in 73 year old female  PLAN: Based on the recommendations of our breast tumor conference she will have reexcision scheduled for next week.  I would wait 2 weeks to do simulation.  I have recommended a hypofractionated course of whole breast radiation over 3 weeks boosting her scar another 1000 centigrade using photon beam therapy.  That may change depending on final pathology results of her reexcision.  Risks and benefits of treatment occluding  skin reaction fatigue alteration blood counts possible inclusion of superficial lung all were reviewed in detail with the patient.  She comprehends my treatment plan well.  Patient also will be a candidate for endocrine therapy after completion of radiation.  Simulation appointment was given 2 weeks after her reexcision.  Patient knows to call with any concerns.  I would like to take this opportunity to thank you for allowing me to participate in the care of your patient.Marland Kitchen  Unice Bailey, MD

## 2023-05-13 ENCOUNTER — Encounter
Admission: RE | Admit: 2023-05-13 | Discharge: 2023-05-13 | Disposition: A | Discharge status: Home / Self Care | Payer: Medicare HMO | Source: Ambulatory Visit | Attending: Surgery | Admitting: Surgery

## 2023-05-13 ENCOUNTER — Ambulatory Visit: Payer: Medicare HMO | Admitting: Oncology

## 2023-05-13 ENCOUNTER — Ambulatory Visit: Payer: Medicare HMO | Admitting: Occupational Therapy

## 2023-05-13 DIAGNOSIS — I10 Essential (primary) hypertension: Secondary | ICD-10-CM

## 2023-05-13 DIAGNOSIS — J984 Other disorders of lung: Secondary | ICD-10-CM

## 2023-05-13 DIAGNOSIS — Z01812 Encounter for preprocedural laboratory examination: Secondary | ICD-10-CM

## 2023-05-13 DIAGNOSIS — Z01818 Encounter for other preprocedural examination: Secondary | ICD-10-CM

## 2023-05-13 DIAGNOSIS — Z79899 Other long term (current) drug therapy: Secondary | ICD-10-CM

## 2023-05-13 NOTE — Patient Instructions (Signed)
 Your procedure is scheduled on:05-20-23 Wednesday Report to the Registration Desk on the 1st floor of the [facility].Then proceed to the 2nd floor Surgery Desk To find out your arrival time, please call [phone] between 1PM - 3PM on:05-19-23 Tuesday If your arrival time is 6:00 am, do not arrive before that time as the [facility] entrance doors do not open until 6:00 am.  REMEMBER: Instructions that are not followed completely may result in serious medical risk, up to and including death; or upon the discretion of your surgeon and anesthesiologist your surgery may need to be rescheduled.  Do not eat food OR drink any liquids after midnight the night before surgery.  No gum chewing or hard candies.  One week prior to surgery:Stop NOW 05-13-23 Stop Anti-inflammatories (NSAIDS) such as Advil, Aleve, Ibuprofen, Motrin, Naproxen, Naprosyn and Aspirin based products such as Excedrin, Goody's Powder, BC Powder. Stop ANY OVER THE COUNTER supplements until after surgery (Calcium-Vitamin D)  You may however, continue to take Tylenol if needed for pain up until the day of surgery.  Stop metFORMIN (GLUCOPHAGE-XR) 2 days prior to surgery-Last dose will be on 05-17-23 Sunday  Continue taking all of your other prescription medications up until the day of surgery.  ON THE DAY OF SURGERY ONLY TAKE THESE MEDICATIONS WITH SIPS OF WATER: -EFFEXOR XR  -gabapentin (NEURONTIN)  -omeprazole (PRILOSEC)   Use your Albuterol Inhaler the day of surgery and bring your Albuterol Inhaler to the hospital  No Alcohol for 24 hours before or after surgery.  No Smoking including e-cigarettes for 24 hours before surgery.  No chewable tobacco products for at least 6 hours before surgery.  No nicotine patches on the day of surgery.  Do not use any "recreational" drugs for at least a week (preferably 2 weeks) before your surgery.  Please be advised that the combination of cocaine and anesthesia may have negative  outcomes, up to and including death. If you test positive for cocaine, your surgery will be cancelled.  On the morning of surgery brush your teeth with toothpaste and water, you may rinse your mouth with mouthwash if you wish. Do not swallow any toothpaste or mouthwash.  Use CHG Soap as directed on instruction sheet.  Do not wear jewelry, make-up, hairpins, clips or nail polish.  For welded (permanent) jewelry: bracelets, anklets, waist bands, etc.  Please have this removed prior to surgery.  If it is not removed, there is a chance that hospital personnel will need to cut it off on the day of surgery.  Do not wear lotions, powders, or perfumes.   Do not shave body hair from the neck down 48 hours before surgery.  Contact lenses, hearing aids and dentures may not be worn into surgery.  Do not bring valuables to the hospital. [facility] is not responsible for any missing/lost belongings or valuables.   Notify your doctor if there is any change in your medical condition (cold, fever, infection).  Wear comfortable clothing (specific to your surgery type) to the hospital.  After surgery, you can help prevent lung complications by doing breathing exercises.  Take deep breaths and cough every 1-2 hours. Your doctor may order a device called an Incentive Spirometer to help you take deep breaths. When coughing or sneezing, hold a pillow firmly against your incision with both hands. This is called "splinting." Doing this helps protect your incision. It also decreases belly discomfort.  If you are being admitted to the hospital overnight, leave your suitcase  in the car. After surgery it may be brought to your room.  In case of increased patient census, it may be necessary for you, the patient, to continue your postoperative care in the Same Day Surgery department.  If you are being discharged the day of surgery, you will not be allowed to drive home. You will need a responsible individual to  drive you home and stay with you for 24 hours after surgery.   If you are taking public transportation, you will need to have a responsible individual with you.  Please call the Pre-admissions Testing Dept. at [phone] if you have any questions about these instructions.  Surgery Visitation Policy:  Patients having surgery or a procedure may have two visitors.  Children under the age of 71 must have an adult with them who is not the patient.  Temporary Visitor Restrictions Due to increasing cases of flu, RSV and COVID-19: Children ages 68 and under will not be able to visit patients in [facility] hospitals under most circumstances.     Preparing for Surgery with CHLORHEXIDINE GLUCONATE (CHG) Soap  Chlorhexidine Gluconate (CHG) Soap  o An antiseptic cleaner that kills germs and bonds with the skin to continue killing germs even after washing  o Used for showering the night before surgery and morning of surgery  Before surgery, you can play an important role by reducing the number of germs on your skin.  CHG (Chlorhexidine gluconate) soap is an antiseptic cleanser which kills germs and bonds with the skin to continue killing germs even after washing.  Please do not use if you have an allergy to CHG or antibacterial soaps. If your skin becomes reddened/irritated stop using the CHG.  1. Shower the NIGHT BEFORE SURGERY and the MORNING OF SURGERY with CHG soap.  2. If you choose to wash your hair, wash your hair first as usual with your normal shampoo.  3. After shampooing, rinse your hair and body thoroughly to remove the shampoo.  4. Use CHG as you would any other liquid soap. You can apply CHG directly to the skin and wash gently with a scrungie or a clean washcloth.  5. Apply the CHG soap to your body only from the neck down. Do not use on open wounds or open sores. Avoid contact with your eyes, ears, mouth, and genitals (private parts). Wash face and genitals (private  parts) with your normal soap.  6. Wash thoroughly, paying special attention to the area where your surgery will be performed.  7. Thoroughly rinse your body with warm water.  8. Do not shower/wash with your normal soap after using and rinsing off the CHG soap.  9. Pat yourself dry with a clean towel.  10. Wear clean pajamas to bed the night before surgery.  12. Place clean sheets on your bed the night of your first shower and do not sleep with pets.  13. Shower again with the CHG soap on the day of surgery prior to arriving at the hospital.  14. Do not apply any deodorants/lotions/powders.  15. Please wear clean clothes to the hospital.

## 2023-05-14 ENCOUNTER — Encounter: Payer: Self-pay | Admitting: Oncology

## 2023-05-14 ENCOUNTER — Encounter
Admission: RE | Admit: 2023-05-14 | Discharge: 2023-05-14 | Disposition: A | Discharge status: Home / Self Care | Source: Ambulatory Visit | Attending: Surgery | Admitting: Surgery

## 2023-05-14 DIAGNOSIS — Z01812 Encounter for preprocedural laboratory examination: Secondary | ICD-10-CM | POA: Insufficient documentation

## 2023-05-14 DIAGNOSIS — J984 Other disorders of lung: Secondary | ICD-10-CM | POA: Insufficient documentation

## 2023-05-14 DIAGNOSIS — Z01818 Encounter for other preprocedural examination: Secondary | ICD-10-CM | POA: Diagnosis present

## 2023-05-14 DIAGNOSIS — I10 Essential (primary) hypertension: Secondary | ICD-10-CM | POA: Insufficient documentation

## 2023-05-14 DIAGNOSIS — Z79899 Other long term (current) drug therapy: Secondary | ICD-10-CM | POA: Diagnosis not present

## 2023-05-14 LAB — BASIC METABOLIC PANEL
Anion gap: 11 (ref 5–15)
BUN: 24 mg/dL — ABNORMAL HIGH (ref 8–23)
CO2: 24 mmol/L (ref 22–32)
Calcium: 9.5 mg/dL (ref 8.9–10.3)
Chloride: 105 mmol/L (ref 98–111)
Creatinine, Ser: 1.05 mg/dL — ABNORMAL HIGH (ref 0.44–1.00)
GFR, Estimated: 56 mL/min — ABNORMAL LOW (ref >60)
Glucose, Bld: 138 mg/dL — ABNORMAL HIGH (ref 70–99)
Potassium: 3.7 mmol/L (ref 3.5–5.1)
Sodium: 140 mmol/L (ref 135–145)

## 2023-05-15 ENCOUNTER — Ambulatory Visit: Payer: Self-pay | Admitting: Genetic Counselor

## 2023-05-15 DIAGNOSIS — Z1379 Encounter for other screening for genetic and chromosomal anomalies: Secondary | ICD-10-CM | POA: Insufficient documentation

## 2023-05-15 NOTE — Telephone Encounter (Signed)
 I spoke to Bianca Rogers to review results of genetic testing. she had genetic testing with [facility] CancerNext-Expanded +RNAinsight panel. 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 We discussed that we do not use VUS to make management decisions or identify at risk family members. Discussed that we do not know why she has cancer or why there is cancer in the family. It could be due to a different gene that we are not testing, or maybe our current technology may not be able to pick something up.  It will be important for her to keep in contact with genetics to keep up with whether additional testing may be needed. We will contact her if new information is learned about the VUS.   Please see counseling note for further detail on this result.

## 2023-05-15 NOTE — Progress Notes (Signed)
 HPI:  [name] was previously seen in the [facility] due to a personal and family history of cancer and concerns regarding a hereditary predisposition to cancer. Please refer to our prior cancer genetics clinic note for more information regarding our discussion, assessment and recommendations, at the time. [name] recent genetic test results were disclosed to her, as were recommendations warranted by these results. These results and recommendations are discussed in more detail below.  CANCER HISTORY:  Oncology History  Invasive carcinoma of breast (HCC)  03/26/2023 Mammogram   Diagnostic unilateral left breast mammogram showed 1. 0.8 cm suspicious mass within the LOWER OUTER LEFT breast, likely representing the enlarging mammographic finding. Tissue sampling is recommended. No abnormal appearing LEFT axillary lymph nodes.   04/08/2023 Initial Diagnosis   Invasive carcinoma of breast [facility])  Patient presented for left breast diagnostic mammogram for follow-up of left breast asymmetry from outside facility.  She denies any breast concerns.  Left breast 5:00 4 cm from nipple needle core biopsy showed - INVASIVE MAMMARY CARCINOMA, NO SPECIAL TYPE.       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 First live birth at age of 57 OCP use: Less than 5 years of use. History of hysterectomy: Yes with removal of 1 ovary. Menopausal status: Postmenopausal History of HRT use: Denies History of chest radiation: Denies Number of previous breast biopsies: Denies Family history positive for multiple family members with breast  cancer.     04/08/2023 Cancer Staging   Staging form: Breast, AJCC 8th Edition - Clinical stage from 04/08/2023: Stage IA (cT1b, cN0, cM0, G3, ER+, PR+, HER2-) - Signed by Rickard Patience, MD on 04/08/2023 Stage prefix: Initial diagnosis Histologic grading system: 3 grade system    Genetic Testing   Negative CancerNext-Expanded +RNAinsight panel. 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 The CancerNext-Expanded gene panel offered by [facility] and includes sequencing, rearrangement, and RNA analysis for the following 76 genes: AIP, ALK, APC, ATM, AXIN2, BAP1, BARD1, BMPR1A, BRCA1, BRCA2, BRIP1, CDC73, CDH1, CDK4, CDKN1B, CDKN2A, CEBPA, CHEK2, CTNNA1, DDX41, DICER1, ETV6, FH, FLCN, GATA2, LZTR1, MAX, MBD4, MEN1, MET, MLH1, MSH2, MSH3, MSH6, MUTYH, NF1, NF2, NTHL1, PALB2, PHOX2B, PMS2, POT1, PRKAR1A, PTCH1, PTEN, RAD51C, RAD51D, RB1, RET, RUNX1, SDHA, SDHAF2, SDHB, SDHC, SDHD, SMAD4, SMARCA4, SMARCB1, SMARCE1, STK11, SUFU, TMEM127, TP53, TSC1, TSC2, VHL, and WT1 (sequencing and deletion/duplication); EGFR, HOXB13, KIT, MITF, PDGFRA, POLD1, and POLE (sequencing only); EPCAM and GREM1 (deletion/duplication only). Report date 05/07/23.      FAMILY HISTORY:  We obtained a detailed, 4-generation family history.  Significant diagnoses are listed below: Family History  Problem Relation Age of Onset   Transient ischemic attack Mother    Thyroid disease Mother    Arthritis Mother    Hearing loss Mother    High blood pressure Mother    High Cholesterol Mother    Miscarriages / [place] Mother  Stroke Mother    Cancer Father 61       metastatic, possible lung or prostate primary   Arthritis Father    Asthma Father    COPD Father    Depression Father    Early death Father    Hearing loss Father    Heart disease Father    High Cholesterol Father    Breast cancer Sister 76   Arthritis Sister    Birth defects Sister    Cancer Sister        breast cancer   Diabetes Sister    High blood pressure Sister     Diabetes Paternal Grandmother    Arthritis Paternal Grandfather    Cancer Paternal Grandfather    Breast cancer Daughter 14       TNBC   Cervical cancer Daughter    Breast cancer Niece 72    [name] Reports her daughter, age 53, was recently diagnosed with triple negative breast cancer and had normal genetic testing. She reports her daughter, age 82, was diagnosed with cervical cancer at age 82. She reports her daughter, age 50, has genetic testing in process. She reports her grandson, age 91, was diagnosed with Hodgkins lymphoma at age 11. [name] reports her sister, age 59, was diagnosed with breast cancer at age 55 and has had normal genetic testing. She reports her niece, age 60, had breast cancer at age 5 and had genetic testing at time of diagnosis that was normal. Familial genetic test reports were not available for review. [name] reports her father was diagnosed with metastatic cancer, primary suspected to be either lung or prostate, at 45 and passed away shortly after diagnosis. She reports a maternal uncle diagnosed with an unknown cancer, living at age 38. She reports a maternal uncle diagnosed with prostate cancer at age 44, living at age 17. She reports a maternal first cousin diagnosed with prostate cancer in his 89s-50s. She reports her maternal grandfather was diagnosed with prostate cancer, metastatic to bone. There is no reported Ashkenazi Jewish ancestry. There is no known consanguinity.     GENETIC TEST RESULTS: Genetic testing reported out on 05/07/23 through the [facility] CancerNext-Expanded +RNAinsight panel found no pathogenic mutations. The CancerNext-Expanded gene panel offered by [facility] and includes sequencing, rearrangement, and RNA analysis for the following 76 genes: AIP, ALK, APC, ATM, AXIN2, BAP1, BARD1, BMPR1A, BRCA1, BRCA2, BRIP1, CDC73, CDH1, CDK4, CDKN1B, CDKN2A, CEBPA, CHEK2, CTNNA1, DDX41, DICER1, ETV6, FH, FLCN, GATA2, LZTR1, MAX, MBD4, MEN1, MET,  MLH1, MSH2, MSH3, MSH6, MUTYH, NF1, NF2, NTHL1, PALB2, PHOX2B, PMS2, POT1, PRKAR1A, PTCH1, PTEN, RAD51C, RAD51D, RB1, RET, RUNX1, SDHA, SDHAF2, SDHB, SDHC, SDHD, SMAD4, SMARCA4, SMARCB1, SMARCE1, STK11, SUFU, TMEM127, TP53, TSC1, TSC2, VHL, and WT1 (sequencing and deletion/duplication); EGFR, HOXB13, KIT, MITF, PDGFRA, POLD1, and POLE (sequencing only); EPCAM and GREM1 (deletion/duplication only). The test report has been scanned into EPIC and is located under the Molecular Pathology section of the Results Review tab.  A portion of the result report is included below for reference.     We discussed with [name] that because current genetic testing is not perfect, it is possible there may be a gene mutation in one of these genes that current testing cannot detect, but that chance is small.  We also discussed, that there could be another gene that has not yet been discovered, or that we have not yet tested, that is responsible for the cancer diagnoses in the family. It is also possible  there is a hereditary cause for the cancer in the family that [name] did not inherit and therefore was not identified in her testing.  Therefore, it is important to remain in touch with cancer genetics in the future so that we can continue to offer [name] the most up to date genetic testing.   Genetic testing did identify a variant of uncertain significance (VUS) was identified in the PTEN gene called p.D312G (c.935A>G).  At this time, it is unknown if this variant is associated with increased cancer risk or if this is a normal finding, but most variants such as this get reclassified to being inconsequential. It should not be used to make medical management decisions. With time, we suspect the lab will determine the significance of this variant, if any. If we do learn more about it, we will try to contact [name] to discuss it further. However, it is important to stay in touch with [place] periodically and keep the  address and phone number up to date.  ADDITIONAL GENETIC TESTING: We discussed with [name] that her genetic testing was fairly extensive.  If there are genes identified to increase cancer risk that can be analyzed in the future, we would be happy to discuss and coordinate this testing at that time.    CANCER SCREENING RECOMMENDATIONS: [name] test result is considered negative (normal).  This means that we have not identified a hereditary cause for her personal and family history of cancer at this time. Most cancers happen by chance and this negative test suggests that her personal and family history of cancer may fall into this category.    Possible reasons for [name] negative genetic test include:  1. There may be a gene mutation in one of these genes that current testing methods cannot detect but that chance is small.  2. There could be another gene that has not yet been discovered, or that we have not yet tested, that is responsible for the cancer diagnoses in the family.  3.  There may be no hereditary risk for cancer in the family. The cancers in [name] and/or her family may be sporadic/familial or due to other genetic and environmental factors. 4. It is also possible there is a hereditary cause for the cancer in the family that [name] did not inherit.  Therefore, it is recommended she continue to follow the cancer management and screening guidelines provided by her oncology and primary healthcare provider. An individual's cancer risk and medical management are not determined by genetic test results alone. Overall cancer risk assessment incorporates additional factors, including personal medical history, family history, and any available genetic information that may result in a personalized plan for cancer prevention and surveillance  Given [name] personal and family histories, and reported history of other family members with breast cancer and negative genetic  testing, we must interpret these negative results with some caution.  Families with features suggestive of hereditary risk for cancer tend to have multiple family members with cancer, diagnoses in multiple generations and diagnoses before the age of 25. [name] family exhibits some of these features. Thus, this result may simply reflect our current inability to detect all mutations within these genes or there may be a different gene that has not yet been discovered or tested.   An individual's cancer risk and medical management are not determined by genetic test results alone. Overall cancer risk assessment incorporates additional factors, including personal medical history, family history,  and any available genetic information that may result in a personalized plan for cancer prevention and surveillance.  RECOMMENDATIONS FOR FAMILY MEMBERS:  Individuals in this family might be at some increased risk of developing cancer, over the general population risk, simply due to the family history of cancer.  We recommended women in this family have a yearly mammogram beginning at age 12, or 77 years younger than the earliest onset of cancer, an annual clinical breast exam, and perform monthly breast self-exams. Women in this family should also have a gynecological exam as recommended by their primary provider. All family members should be referred for colonoscopy starting at age 25, or 74 years younger than the earliest onset of cancer.  FOLLOW-UP: Lastly, we discussed with [name] that cancer genetics is a rapidly advancing field and it is possible that new genetic tests will be appropriate for her and/or her family members in the future. We encouraged her to remain in contact with cancer genetics on an annual basis so we can update her personal and family histories and let her know of advances in cancer genetics that may benefit this family.   Our contact number was provided. [name] questions were  answered to her satisfaction, and she knows she is welcome to call us at anytime with additional questions or concerns.   Vassie Moment, MS, [facility] Licensed, Retail [email or web address]

## 2023-05-15 NOTE — Telephone Encounter (Signed)
 I have requested the images again through powershare.

## 2023-05-19 MED ORDER — CHLORHEXIDINE GLUCONATE CLOTH 2 % EX PADS
6.0000 | MEDICATED_PAD | Freq: Once | CUTANEOUS | Status: DC
Start: 2023-05-19 — End: 2023-05-20

## 2023-05-19 MED ORDER — CELECOXIB 200 MG PO CAPS
200.0000 mg | ORAL_CAPSULE | ORAL | Status: AC
  Administered 2023-05-20: 200 mg via ORAL

## 2023-05-19 MED ORDER — CEFAZOLIN SODIUM-DEXTROSE 2-4 GM/100ML-% IV SOLN
2.0000 g | INTRAVENOUS | Status: AC
Start: 2023-05-20 — End: 2023-05-21
  Administered 2023-05-20: 2 g via INTRAVENOUS

## 2023-05-19 MED ORDER — ORAL CARE MOUTH RINSE
15.0000 mL | Freq: Once | OROMUCOSAL | Status: AC
Start: 2023-05-19 — End: 2023-05-20

## 2023-05-19 MED ORDER — BUPIVACAINE LIPOSOME 1.3 % IJ SUSP: 20.0000 mL | Freq: Once | INTRAMUSCULAR | Status: DC

## 2023-05-19 MED ORDER — SODIUM CHLORIDE 0.9 % IV SOLN: INTRAVENOUS | Status: DC

## 2023-05-19 MED ORDER — CHLORHEXIDINE GLUCONATE CLOTH 2 % EX PADS
6.0000 | MEDICATED_PAD | Freq: Once | CUTANEOUS | Status: AC
  Administered 2023-05-20: 6 via TOPICAL

## 2023-05-19 MED ORDER — CHLORHEXIDINE GLUCONATE 0.12 % MT SOLN
15.0000 mL | Freq: Once | OROMUCOSAL | Status: AC
  Administered 2023-05-20: 15 mL via OROMUCOSAL

## 2023-05-19 MED ORDER — GABAPENTIN 300 MG PO CAPS
300.0000 mg | ORAL_CAPSULE | ORAL | Status: AC
  Administered 2023-05-20: 300 mg via ORAL

## 2023-05-19 MED ORDER — ACETAMINOPHEN 500 MG PO TABS
1000.0000 mg | ORAL_TABLET | ORAL | Status: AC
  Administered 2023-05-20: 1000 mg via ORAL

## 2023-05-20 ENCOUNTER — Ambulatory Visit: Payer: Self-pay | Admitting: Urgent Care

## 2023-05-20 ENCOUNTER — Other Ambulatory Visit: Payer: Self-pay

## 2023-05-20 ENCOUNTER — Ambulatory Visit: Admitting: Anesthesiology

## 2023-05-20 ENCOUNTER — Encounter: Payer: Self-pay | Admitting: Surgery

## 2023-05-20 ENCOUNTER — Ambulatory Visit
Admission: RE | Admit: 2023-05-20 | Discharge: 2023-05-20 | Disposition: A | Discharge status: Home / Self Care | Payer: Medicare HMO | Attending: Surgery | Admitting: Surgery

## 2023-05-20 ENCOUNTER — Encounter
Admission: RE | Disposition: A | Discharge status: Home / Self Care | Payer: Self-pay | Source: Home / Self Care | Attending: Surgery

## 2023-05-20 DIAGNOSIS — I10 Essential (primary) hypertension: Secondary | ICD-10-CM | POA: Diagnosis not present

## 2023-05-20 DIAGNOSIS — C50512 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 female breast: Secondary | ICD-10-CM | POA: Diagnosis not present

## 2023-05-20 DIAGNOSIS — Z170 Estrogen receptor positive status [ER+]: Secondary | ICD-10-CM | POA: Diagnosis not present

## 2023-05-20 DIAGNOSIS — E1140 Type 2 diabetes mellitus with diabetic neuropathy, unspecified: Secondary | ICD-10-CM | POA: Insufficient documentation

## 2023-05-20 DIAGNOSIS — J45909 Unspecified asthma, uncomplicated: Secondary | ICD-10-CM | POA: Diagnosis not present

## 2023-05-20 DIAGNOSIS — Z1732 Human epidermal growth factor receptor 2 negative status: Secondary | ICD-10-CM | POA: Insufficient documentation

## 2023-05-20 DIAGNOSIS — Z01818 Encounter for other preprocedural examination: Secondary | ICD-10-CM

## 2023-05-20 DIAGNOSIS — Z79899 Other long term (current) drug therapy: Secondary | ICD-10-CM | POA: Insufficient documentation

## 2023-05-20 DIAGNOSIS — Z87891 Personal history of nicotine dependence: Secondary | ICD-10-CM | POA: Insufficient documentation

## 2023-05-20 DIAGNOSIS — K7581 Nonalcoholic steatohepatitis (NASH): Secondary | ICD-10-CM | POA: Insufficient documentation

## 2023-05-20 DIAGNOSIS — C50912 Malignant neoplasm of unspecified site of left female breast: Secondary | ICD-10-CM | POA: Diagnosis present

## 2023-05-20 DIAGNOSIS — K219 Gastro-esophageal reflux disease without esophagitis: Secondary | ICD-10-CM | POA: Insufficient documentation

## 2023-05-20 HISTORY — PX: RE-EXCISION OF BREAST LUMPECTOMY: SHX6048

## 2023-05-20 LAB — GLUCOSE, CAPILLARY
Glucose-Capillary: 143 mg/dL — ABNORMAL HIGH (ref 70–99)
Glucose-Capillary: 156 mg/dL — ABNORMAL HIGH (ref 70–99)

## 2023-05-20 SURGERY — EXCISION, LESION, BREAST
Anesthesia: General | Laterality: Left

## 2023-05-20 MED ORDER — CHLORHEXIDINE GLUCONATE 0.12 % MT SOLN
OROMUCOSAL | Status: AC
  Filled 2023-05-20: qty 15

## 2023-05-20 MED ORDER — FENTANYL CITRATE (PF) 100 MCG/2ML IJ SOLN
INTRAMUSCULAR | Status: AC
  Filled 2023-05-20: qty 2

## 2023-05-20 MED ORDER — DROPERIDOL 2.5 MG/ML IJ SOLN: 0.6250 mg | Freq: Once | INTRAMUSCULAR | Status: DC | PRN

## 2023-05-20 MED ORDER — FENTANYL CITRATE (PF) 100 MCG/2ML IJ SOLN
25.0000 ug | INTRAMUSCULAR | Status: DC | PRN
  Administered 2023-05-20: 25 ug via INTRAVENOUS

## 2023-05-20 MED ORDER — PHENYLEPHRINE 80 MCG/ML (10ML) SYRINGE FOR IV PUSH (FOR BLOOD PRESSURE SUPPORT)
PREFILLED_SYRINGE | INTRAVENOUS | Status: AC
  Filled 2023-05-20: qty 10

## 2023-05-20 MED ORDER — PROPOFOL 10 MG/ML IV BOLUS
INTRAVENOUS | Status: AC
  Filled 2023-05-20: qty 20

## 2023-05-20 MED ORDER — HYDROCODONE-ACETAMINOPHEN 5-325 MG PO TABS: 1.0000 | ORAL_TABLET | Freq: Four times a day (QID) | ORAL | 0 refills | Status: DC | PRN

## 2023-05-20 MED ORDER — BUPIVACAINE-EPINEPHRINE (PF) 0.25% -1:200000 IJ SOLN
INTRAMUSCULAR | Status: AC
  Filled 2023-05-20: qty 30

## 2023-05-20 MED ORDER — DEXAMETHASONE SODIUM PHOSPHATE 10 MG/ML IJ SOLN
INTRAMUSCULAR | Status: AC
  Filled 2023-05-20: qty 1

## 2023-05-20 MED ORDER — ONDANSETRON HCL 4 MG/2ML IJ SOLN
INTRAMUSCULAR | Status: DC | PRN
  Administered 2023-05-20: 4 mg via INTRAVENOUS

## 2023-05-20 MED ORDER — LIDOCAINE HCL (CARDIAC) PF 100 MG/5ML IV SOSY
PREFILLED_SYRINGE | INTRAVENOUS | Status: DC | PRN
  Administered 2023-05-20: 60 mg via INTRAVENOUS

## 2023-05-20 MED ORDER — PROPOFOL 10 MG/ML IV BOLUS
INTRAVENOUS | Status: DC | PRN
  Administered 2023-05-20: 100 ug/kg/min via INTRAVENOUS
  Administered 2023-05-20: 120 ug/kg/min via INTRAVENOUS

## 2023-05-20 MED ORDER — DEXAMETHASONE SODIUM PHOSPHATE 10 MG/ML IJ SOLN
INTRAMUSCULAR | Status: DC | PRN
  Administered 2023-05-20: 5 mg via INTRAVENOUS

## 2023-05-20 MED ORDER — CEFAZOLIN SODIUM-DEXTROSE 2-4 GM/100ML-% IV SOLN
INTRAVENOUS | Status: AC
  Filled 2023-05-20: qty 100

## 2023-05-20 MED ORDER — SEVOFLURANE IN SOLN
RESPIRATORY_TRACT | Status: AC
  Filled 2023-05-20: qty 250

## 2023-05-20 MED ORDER — MIDAZOLAM HCL 2 MG/2ML IJ SOLN
INTRAMUSCULAR | Status: DC | PRN
  Administered 2023-05-20 (×2): .5 mg via INTRAVENOUS

## 2023-05-20 MED ORDER — FENTANYL CITRATE (PF) 100 MCG/2ML IJ SOLN
INTRAMUSCULAR | Status: DC | PRN
  Administered 2023-05-20 (×2): 25 ug via INTRAVENOUS

## 2023-05-20 MED ORDER — OXYCODONE HCL 5 MG PO TABS: 5.0000 mg | ORAL_TABLET | Freq: Once | ORAL | Status: DC | PRN

## 2023-05-20 MED ORDER — GABAPENTIN 300 MG PO CAPS
ORAL_CAPSULE | ORAL | Status: AC
  Filled 2023-05-20: qty 1

## 2023-05-20 MED ORDER — PROPOFOL 1000 MG/100ML IV EMUL
INTRAVENOUS | Status: AC
Start: 2023-05-20 — End: ?
  Filled 2023-05-20: qty 100

## 2023-05-20 MED ORDER — ACETAMINOPHEN 500 MG PO TABS
ORAL_TABLET | ORAL | Status: AC
  Filled 2023-05-20: qty 2

## 2023-05-20 MED ORDER — MIDAZOLAM HCL 2 MG/2ML IJ SOLN
INTRAMUSCULAR | Status: AC
  Filled 2023-05-20: qty 2

## 2023-05-20 MED ORDER — ONDANSETRON HCL 4 MG/2ML IJ SOLN
INTRAMUSCULAR | Status: AC
Start: 2023-05-20 — End: ?
  Filled 2023-05-20: qty 2

## 2023-05-20 MED ORDER — PHENYLEPHRINE 80 MCG/ML (10ML) SYRINGE FOR IV PUSH (FOR BLOOD PRESSURE SUPPORT)
PREFILLED_SYRINGE | INTRAVENOUS | Status: DC | PRN
  Administered 2023-05-20: 80 ug via INTRAVENOUS

## 2023-05-20 MED ORDER — OXYCODONE HCL 5 MG/5ML PO SOLN: 5.0000 mg | Freq: Once | ORAL | Status: DC | PRN

## 2023-05-20 MED ORDER — CELECOXIB 200 MG PO CAPS
ORAL_CAPSULE | ORAL | Status: AC
  Filled 2023-05-20: qty 1

## 2023-05-20 SURGICAL SUPPLY — 31 items
APPLIER CLIP 9.375 SM OPEN (CLIP) IMPLANT
BLADE SURG 15 STRL LF DISP TIS (BLADE) ×1 IMPLANT
CHLORAPREP W/TINT 26 (MISCELLANEOUS) IMPLANT
CLIP APPLIE 9.375 SM OPEN (CLIP) IMPLANT
DERMABOND ADVANCED .7 DNX12 (GAUZE/BANDAGES/DRESSINGS) ×1 IMPLANT
DEVICE DUBIN SPECIMEN MAMMOGRA (MISCELLANEOUS) IMPLANT
DRAPE LAPAROTOMY TRNSV 106X77 (MISCELLANEOUS) ×1 IMPLANT
ELECT CAUTERY BLADE TIP 2.5 (TIP) ×1 IMPLANT
ELECT REM PT RETURN 9FT ADLT (ELECTROSURGICAL) ×1 IMPLANT
ELECTRODE CAUTERY BLDE TIP 2.5 (TIP) ×1 IMPLANT
ELECTRODE REM PT RTRN 9FT ADLT (ELECTROSURGICAL) ×1 IMPLANT
GAUZE 4X4 16PLY ~~LOC~~+RFID DBL (SPONGE) IMPLANT
GLOVE ORTHO TXT STRL SZ7.5 (GLOVE) ×1 IMPLANT
GOWN STRL REUS W/ TWL LRG LVL3 (GOWN DISPOSABLE) ×1 IMPLANT
GOWN STRL REUS W/ TWL XL LVL3 (GOWN DISPOSABLE) ×1 IMPLANT
KIT MARKER MARGIN INK (KITS) IMPLANT
KIT TURNOVER KIT A (KITS) ×1 IMPLANT
MANIFOLD NEPTUNE II (INSTRUMENTS) ×1 IMPLANT
MARKER MARGIN CORRECT CLIP (MARKER) IMPLANT
NDL HYPO 22X1.5 SAFETY MO (MISCELLANEOUS) ×1 IMPLANT
NEEDLE HYPO 22X1.5 SAFETY MO (MISCELLANEOUS) ×1 IMPLANT
PACK BASIN MINOR ARMC (MISCELLANEOUS) ×1 IMPLANT
SPIKE FLUID TRANSFER (MISCELLANEOUS) ×1 IMPLANT
SUT MNCRL 4-0 27 PS-2 XMFL (SUTURE) ×1 IMPLANT
SUT VIC AB 3-0 SH 27X BRD (SUTURE) ×1 IMPLANT
SUTURE MNCRL 4-0 27XMF (SUTURE) ×1 IMPLANT
SYR 10ML LL (SYRINGE) ×1 IMPLANT
TRAP FLUID SMOKE EVACUATOR (MISCELLANEOUS) ×1 IMPLANT
TRAP NEPTUNE SPECIMEN COLLECT (MISCELLANEOUS) ×1 IMPLANT
WATER STERILE IRR 1000ML POUR (IV SOLUTION) ×1 IMPLANT
WATER STERILE IRR 500ML POUR (IV SOLUTION) ×1 IMPLANT

## 2023-05-20 NOTE — H&P (Signed)
 HPI: Bianca Rogers is a 73 y.o. 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       SURGICAL PATHOLOGY [facility] [address] [place], [place] [address] Telephone [phone] or [phone] Fax [phone]  REPORT OF SURGICAL PATHOLOGY   Accession #: [id number] Patient Name: Bianca Rogers Visit # : [id number]  MRN: [id number] Physician: Campbell Lerner DOB/Age 03/29/1950 (Age: 64) Gender: F Collected Date: 04/22/2023 Received Date: 04/22/2023  FINAL DIAGNOSIS       1. Lymph node, sentinel, biopsy, #1 :      ONE LYMPH NODE, NEGATIVE FOR METASTATIC CARCINOMA (0/1).       2. 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 Lymph node, sentinel, biopsy, #2,3.4.5,6,7 :      FIVE LYMPH NODES, NEGATIVE FOR METASTATIC CARCINOMA (0/5).       4. Breast, lumpectomy, Left, additional superior and postreior margins :      DUCTAL CARCINOMA IN SITU, HIGH GRADE.      DCIS INVOLVES INKED BLACK MARGIN / NEW POSTERIOR MARGIN.      NEW SUPERIOR MARGIN IS NEGATIVE FOR CARCINOMA.  Vital signs: BP (!) 145/76   Pulse 86   Temp 98.4 F (36.9 C) (Oral)   Ht 5\' 4"  (1.626 m)   Wt 158  lb (71.7 kg)   SpO2 97%   BMI 27.12 kg/m     Physical Exam: Constitutional: She appears well, at her baseline. Chest: Clear to auscultation. Heart: Regular rate and rhythm. Breast: Her left breast has an inferior based incision scar that appears to be healing nicely.  It is well hidden in the inframammary crease.  There is no adjacent hematoma, seroma or mass effect present.  Left axillary incision appears to be healing nicely well sealed with Dermabond.  No significant underlying hematoma or edema present no appreciable tenderness, no difficulty with range of motion.   Assessment/Plan: This is a 73 y.o. female excision of invasive carcinoma from inferior middle left breast, sentinel node negative.  However DCIS seems to extend to the deep/posterior margin.       Patient Active Problem List    Diagnosis Date Noted   Change in facial mole 04/17/2023   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 breast of female, estrogen receptor positive (HCC) 04/09/2023   Invasive carcinoma of breast (HCC) 04/08/2023  Family history of breast cancer 04/08/2023   Gastroesophageal reflux disease 03/02/2023   Liver cirrhosis secondary to NASH (HCC) 03/02/2023   Bronchiectasis (HCC) 06/09/2013   Type 2 diabetes mellitus with diabetic neuropathy (HCC) 06/09/2013   HTN (hypertension), benign 06/09/2013                -Result from Oncotype RS 21 -Plan to proceed with reexcision of left breast with focus on the deep margin.

## 2023-05-20 NOTE — Op Note (Signed)
 Reexcision lumpectomy site left inferior breast.  Pre-operative Diagnosis: Positive posterior margins with DCIS, status post prior Scout tag localized lumpectomy.  Post-operative Diagnosis: same.   Surgeon: Campbell Lerner, M.D., FACS  Anesthesia: General  Findings: As expected  Estimated Blood Loss: Less than 5 mL         Specimens: Inferior left breast tissue with overlying scar and prior biopsy cavity.  Painted with 6 colors for standard orientation.          Complications: none              Procedure Details  The patient was seen again in the Holding Room. The benefits, complications, treatment options, and expected outcomes were discussed with the patient. The risks of bleeding, infection, recurrence of symptoms, failure to resolve symptoms, unanticipated injury, prosthetic placement, prosthetic infection, any of which could require further surgery were reviewed with the patient. The likelihood of improving the patient's symptoms with return to their baseline status is expected.  The patient and/or family concurred with the proposed plan, giving informed consent.  The patient was taken to Operating Room, identified and the procedure verified.    Prior to the induction of general anesthesia, antibiotic prophylaxis was administered. VTE prophylaxis was in place.  General anesthesia was then administered and tolerated well. After the induction, the patient was positioned in the supine position and the left breast was prepped with  Chloraprep and draped in the sterile fashion.  A Time Out was held and the above information confirmed.  The prior scar was excised utilizing 15 blade after infiltration of local anesthetic of quarter percent Marcaine with epinephrine.  Utilizing electrosurgery I proceeded with following the prior biopsy cavity anteriorly medially and laterally.  I then utilized a 15 blade scalpel to complete sharp division of the posterior margin, I encountered the underlying  fascia and some serratus muscle while dissecting off the posterior margin from the chest wall.  Hemostasis obtained.  Confirmed with water irrigation.  The specimen was taken to the back table where it was oriented with the 6 color paints.  There is no need for x-ray, it was sent for permanent section. Then proceeded with skin closure, again confirming hemostasis prior to closure.  The dermis was approximated with interrupted 3-0 dermal Vicryl's followed by a running 4-0 Monocryl subcuticular. Dermabond is used to seal the skin, then I infiltrated 10 mL of additional local anesthetic into the cavity.  Patient tolerated procedure well.  Transferred to recovery in stable condition.   Campbell Lerner M.D., [facility] 05/20/2023 12:56 PM

## 2023-05-20 NOTE — Anesthesia Preprocedure Evaluation (Addendum)
 Anesthesia Evaluation  Patient identified by MRN, date of birth, ID band Patient awake    Reviewed: Allergy & Precautions, H&P , NPO status , Patient's Chart, lab work & pertinent test results, reviewed documented beta blocker date and time   History of Anesthesia Complications Negative for: history of anesthetic complications  Airway Mallampati: II  TM Distance: >3 FB Neck ROM: full    Dental  (+) Dental Advidsory Given, Caps, Teeth Intact Several permanent bridges:   Pulmonary shortness of breath and with exertion, asthma , neg sleep apnea, neg COPD, neg recent URI, former smoker bronchiectasis   Pulmonary exam normal breath sounds clear to auscultation       Cardiovascular Exercise Tolerance: Good hypertension, (-) angina + Peripheral Vascular Disease  (-) CAD, (-) Past MI and (-) Cardiac Stents Normal cardiovascular exam(-) dysrhythmias + Valvular Problems/Murmurs  Rhythm:regular Rate:Normal     Neuro/Psych  PSYCHIATRIC DISORDERS  Depression    negative neurological ROS     GI/Hepatic ,GERD  Controlled,,(+) Cirrhosis       NASH   Endo/Other  diabetes, Type 2    Renal/GU negative Renal ROS  negative genitourinary   Musculoskeletal   Abdominal   Peds  Hematology negative hematology ROS (+)   Anesthesia Other Findings Prior splenectomy  Past Medical History: 12/09/2016: Anal fissure     Comment:  Added automatically from request for surgery [id number]                  Added automatically from request for surgery [id number]   12/03/2020: Arthralgia of left knee 08/29/2022: Asthma     Comment:  Chronic/stable.    Follows with Dr. Westley Hummer,               pulmonology.   Using Flovent and albuterol inhaler as               needed.   12/11/2015: Bartholin cyst     Comment:  Added automatically from request for surgery [id number]   No date: Bronchiectasis (HCC) 04/02/2020: Carotid artery disease (HCC) 01/09/2022:  Cervical radiculitis 02/28/2021: COVID No date: Diabetes mellitus without complication (HCC) 06/09/2013: Dyspnea 03/02/2023: Encounter to establish care No date: GERD (gastroesophageal reflux disease) 10/18/2020: H/O splenectomy     Comment:  2021.   No date: Heart murmur 08/21/2021: History of total knee arthroplasty 08/29/2022: Hyperlipidemia     Comment:  Lab Results   Component Value Date    Cholesterol 197               01/28/2022      Lab Results   Component Value Date    HDL              56 01/28/2022      Lab Results   Component Value Date                  LDL Calculated 115 01/28/2022      Lab Results                 Component Value Date    Triglycerides 131 01/28/2022                   Lab Results   Component Value Date    Chol/HDL Ratio 3.5               01/28/2022      Criteria used to determine recommendation  include:  No date: Hypertension No date: Invasive carcinoma of breast (HCC) No date: Liver cirrhosis secondary to NASH (HCC) No date: Lung nodule 04/02/2020: Major depressive disorder in full remission (HCC)     Comment:  Mood stable on venlafaxine 150 mg daily.   No date: 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 breast of  female, estrogen receptor positive (HCC) 08/29/2022: Migraine 09/29/2012: NAFLD (nonalcoholic fatty liver disease) No date: Non-alcoholic cirrhosis (HCC) 09/03/2020: Osteoarthritis of left knee 07/07/2019: Peritoneal hematoma 09/03/2020: Sprain of MCL (medial collateral ligament) of knee 07/09/2010: Symptom associated with female genital organs   Reproductive/Obstetrics negative OB ROS                              Anesthesia Physical Anesthesia Plan  ASA: 3  Anesthesia Plan: General   Post-op Pain Management: Tylenol PO (pre-op)*, Celebrex PO (pre-op)* and Gabapentin PO (pre-op)*   Induction: Intravenous  PONV Risk Score and Plan: 2 and Dexamethasone, Ondansetron, Propofol infusion  and TIVA  Airway Management Planned: LMA  Additional Equipment:   Intra-op Plan:   Post-operative Plan: Extubation in OR  Informed Consent: I have reviewed the patients History and Physical, chart, labs and discussed the procedure including the risks, benefits and alternatives for the proposed anesthesia with the patient or authorized representative who has indicated his/her understanding and acceptance.     Dental Advisory Given  Plan Discussed with: Anesthesiologist, CRNA and Surgeon  Anesthesia Plan Comments:         Anesthesia Quick Evaluation

## 2023-05-20 NOTE — Anesthesia Procedure Notes (Signed)
 Procedure Name: LMA Insertion Date/Time: 05/20/2023 12:03 PM  Performed by: Omer Jack, CRNAPre-anesthesia Checklist: Patient identified, Patient being monitored, Timeout performed, Emergency Drugs available and Suction available Patient Re-evaluated:Patient Re-evaluated prior to induction Oxygen Delivery Method: Circle system utilized Preoxygenation: Pre-oxygenation with 100% oxygen Induction Type: IV induction Ventilation: Mask ventilation without difficulty LMA: LMA inserted LMA Size: 4.0 Tube type: Oral Number of attempts: 1 Placement Confirmation: positive ETCO2 and breath sounds checked- equal and bilateral Tube secured with: Tape Dental Injury: Teeth and Oropharynx as per pre-operative assessment

## 2023-05-20 NOTE — Transfer of Care (Signed)
 Immediate Anesthesia Transfer of Care Note  Patient: Bianca Rogers  Procedure(s) Performed: EXCISION, LESION, BREAST, REPEAT (Left)  Patient Location: PACU  Anesthesia Type:General  Level of Consciousness: awake  Airway & Oxygen Therapy: Patient Spontanous Breathing and Patient connected to nasal cannula oxygen  Post-op Assessment: Report given to RN and Post -op Vital signs reviewed and stable  Post vital signs: Reviewed and stable  Last Vitals:  Vitals Value Taken Time  BP 127/59 05/20/23 1247  Temp    Pulse 76 05/20/23 1249  Resp 17 05/20/23 1249  SpO2 100 % 05/20/23 1249  Vitals shown include unfiled device data.  Last Pain:  Vitals:   05/20/23 1119  TempSrc: Rectal  PainSc: 0-No pain      Patients Stated Pain Goal: 0 (05/20/23 1119)  Complications: No notable events documented.

## 2023-05-20 NOTE — Interval H&P Note (Signed)
 History and Physical Interval Note:  05/20/2023 11:12 AM  Bianca Rogers  has presented today for surgery, with the diagnosis of left breast cancer.  The various methods of treatment have been discussed with the patient and family. After consideration of risks, benefits and other options for treatment, the patient has consented to  Procedure(s): EXCISION, LESION, BREAST, REPEAT (Left) as a surgical intervention.  The patient's history has been reviewed, patient examined, no change in status, stable for surgery.  I have reviewed the patient's chart and labs.  Questions were answered to the patient's satisfaction.   The left breast is marked.   Campbell Lerner

## 2023-05-21 NOTE — Anesthesia Postprocedure Evaluation (Signed)
 Anesthesia Post Note  Patient: Bianca Rogers  Procedure(s) Performed: EXCISION, LESION, BREAST, REPEAT (Left)  Patient location during evaluation: PACU Anesthesia Type: General Level of consciousness: awake and alert Pain management: pain level controlled Vital Signs Assessment: post-procedure vital signs reviewed and stable Respiratory status: spontaneous breathing, nonlabored ventilation and respiratory function stable Cardiovascular status: blood pressure returned to baseline and stable Postop Assessment: no apparent nausea or vomiting Anesthetic complications: no   No notable events documented.   Last Vitals:  Vitals:   05/20/23 1330 05/20/23 1347  BP: (!) 133/58 114/76  Pulse: 66 73  Resp: 14 14  Temp: (!) 36.4 C 36.4 C  SpO2: 100% 97%    Last Pain:  Vitals:   05/21/23 1106  TempSrc:   PainSc: 5                  Foye Deer

## 2023-05-22 ENCOUNTER — Encounter: Payer: Self-pay | Admitting: Surgery

## 2023-05-22 LAB — SURGICAL PATHOLOGY

## 2023-05-27 ENCOUNTER — Ambulatory Visit: Payer: Medicare HMO | Attending: Oncology | Admitting: Occupational Therapy

## 2023-05-27 ENCOUNTER — Inpatient Hospital Stay: Payer: Medicare HMO | Attending: Oncology | Admitting: Oncology

## 2023-05-27 ENCOUNTER — Encounter: Payer: Self-pay | Admitting: Oncology

## 2023-05-27 VITALS — BP 135/67 | HR 83 | Temp 98.5°F | Resp 18 | Wt 160.3 lb

## 2023-05-27 DIAGNOSIS — R293 Abnormal posture: Secondary | ICD-10-CM | POA: Diagnosis present

## 2023-05-27 DIAGNOSIS — C50512 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 female breast: Secondary | ICD-10-CM | POA: Diagnosis present

## 2023-05-27 DIAGNOSIS — Z87891 Personal history of nicotine dependence: Secondary | ICD-10-CM | POA: Diagnosis not present

## 2023-05-27 DIAGNOSIS — Z170 Estrogen receptor positive status [ER+]: Secondary | ICD-10-CM | POA: Insufficient documentation

## 2023-05-27 DIAGNOSIS — M8580 Other specified disorders of bone density and structure, unspecified site: Secondary | ICD-10-CM | POA: Diagnosis not present

## 2023-05-27 DIAGNOSIS — C50919 Malignant neoplasm of unspecified site of unspecified female breast: Secondary | ICD-10-CM

## 2023-05-27 NOTE — Progress Notes (Signed)
 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  pT1b pN0 ER100%, PR 95% HER2 (0) Oncotype Dx score 21, will not offer adjuvant chemotherapy.  Recommend adjuvant radiation followed by adjuvant endocrine therapy.    Osteopenia Will obtain baseline bone density   Orders Placed This Encounter  Procedures   DG Bone Density    Standing Status:   Future    Expected Date:   06/03/2023    Expiration Date:   05/26/2024    Reason for Exam (SYMPTOM  OR DIAGNOSIS REQUIRED):   breast cancer,    Preferred imaging location?:   [facility]   Follow-up to be determined All questions were answered. The patient knows to call the clinic with any problems, questions or concerns.  Rickard Patience, MD, PhD [facility] Hematology Oncology 05/27/2023    HISTORY OF PRESENTING ILLNESS:  Bianca Rogers 72 y.o. female presents to establish care for left breast invasive carcinoma I have reviewed her chart and materials related to her cancer extensively and collaborated history with the patient. Summary of oncologic history is as follows: Oncology History  Invasive carcinoma of breast (HCC)  03/26/2023 Mammogram   Diagnostic unilateral left breast mammogram showed 1. 0.8 cm suspicious mass within the LOWER OUTER LEFT breast, likely representing the enlarging mammographic finding. Tissue sampling is recommended. No abnormal appearing LEFT axillary lymph nodes.   04/08/2023  Initial Diagnosis   Invasive carcinoma of breast [facility])  Patient presented for left breast diagnostic mammogram for follow-up of left breast asymmetry from outside facility.  She denies any breast concerns.  Left breast 5:00 4 cm from nipple needle core biopsy showed - INVASIVE MAMMARY CARCINOMA, NO SPECIAL TYPE.       - TUBULE FORMATION: SCORE 3       - NUCLEAR PLEOMORPHISM: SCORE 3       - MITOTIC COUNT: SCORE 2       - TOTAL SCORE: 8       - OVERALL GRADE: 3       - LYMPHOVASCULAR INVASION: NOT IDENTIFIED       - CANCER LENGTH: 8 MM       - CALCIFICATIONS: NOT IDENTIFIED       - DUCTAL CARCINOMA IN SITU: PRESENT, HIGH-GRADE   ER 100%, PR 95%, HER2 negative [0]   Menarche at age of 46 or 52. First live birth at age of 29 OCP use: Less than 5 years of use. History of hysterectomy: Yes with removal of 1 ovary. Menopausal status: Postmenopausal History of HRT use: Denies History of chest radiation: Denies Number of previous breast biopsies: Denies Family history positive for multiple family members with breast cancer.     04/08/2023 Cancer Staging   Staging form: Breast, AJCC 8th Edition - Clinical stage from 04/08/2023: Stage IA (cT1b, cN0, cM0, G3, ER+, PR+, HER2-) - Signed by Rickard Patience, MD on 04/08/2023 Stage prefix: Initial diagnosis Histologic grading system: 3 grade system    Genetic Testing   Negative CancerNext-Expanded +RNAinsight panel. VUS  in PTEN p.D312G (c.935A>G). The CancerNext-Expanded gene panel offered by [facility] and includes sequencing, rearrangement, and RNA analysis for the following 76 genes: AIP, ALK, APC, ATM, AXIN2, BAP1, BARD1, BMPR1A, BRCA1, BRCA2, BRIP1, CDC73, CDH1, CDK4, CDKN1B, CDKN2A, CEBPA, CHEK2, CTNNA1, DDX41, DICER1, ETV6, FH, FLCN, GATA2, LZTR1, MAX, MBD4, MEN1, MET, MLH1, MSH2, MSH3, MSH6, MUTYH, NF1, NF2, NTHL1, PALB2, PHOX2B, PMS2, POT1, PRKAR1A, PTCH1, PTEN, RAD51C, RAD51D, RB1, RET, RUNX1, SDHA, SDHAF2, SDHB, SDHC, SDHD, SMAD4,  SMARCA4, SMARCB1, SMARCE1, STK11, SUFU, TMEM127, TP53, TSC1, TSC2, VHL, and WT1 (sequencing and deletion/duplication); EGFR, HOXB13, KIT, MITF, PDGFRA, POLD1, and POLE (sequencing only); EPCAM and GREM1 (deletion/duplication only). Report date 05/07/23.    04/13/2023 Imaging   MRI breast bilateral w wo contrast   1. Biopsy-proven 0.8 cm malignancy within the posterior LOWER LEFT breast. No other suspicious abnormalities noted within either breast although motion artifact slightly decreases sensitivity. 2. No abnormal appearing lymph nodes.      04/22/2023 Surgery   Patient underwent left breast lumpectomy and sentinel lymph node biopsy. Pathology showed 1. Lymph node, sentinel, biopsy, #1 :      ONE LYMPH NODE, NEGATIVE FOR METASTATIC CARCINOMA (0/1).       2. 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 Lymph node, sentinel, biopsy, #2,3.4.5,6,7 :      FIVE LYMPH NODES, NEGATIVE FOR METASTATIC CARCINOMA (0/5).       4. Breast, lumpectomy, Left, additional superior and postreior margins :      DUCTAL CARCINOMA IN SITU, HIGH GRADE.      DCIS INVOLVES INKED BLACK MARGIN / NEW POSTERIOR MARGIN.      NEW SUPERIOR MARGIN IS NEGATIVE FOR CARCINOMA. Diagnosis Note : INVASIVE CARCINOMA OF THE BREAST:  Resection      Procedure: Lumpectomy      Specimen Laterality: Left      Histologic Type: Invasive  ductal carcinoma      Histologic Grade:      Glandular (Acinar)/Tubular Differentiation: 3      Nuclear Pleomorphism: 3      Mitotic Rate: 2      Overall Grade: 3      Tumor Size: 1.0 cm      Ductal Carcinoma In Situ: Solid and cribriform types, intermediate to high grade      Lymphatic and/or Vascular Invasion:  Not identified      Treatment Effect in the Breast: No known presurgical therapy      Margins: All margins negative for invasive carcinoma      Distance from Closest Margin (mm): 1.5 mm  from inferior margin      Specify Closest Margin (required only if <63mm): 1.5 mm from inferior margin      DCIS Margins: Involved by DCIS      Distance from Closest Margin (mm): 0      Specify Closest Margin (required only if <59mm): New posterior margin      Regional Lymph Nodes:      Number of Lymph Nodes Examined: 6      Number of Sentinel Nodes Examined: 6      Number of Lymph Nodes with Macrometastases (>2 mm): 0      Number of Lymph Nodes with Micrometastases: 0      Number of Lymph Nodes with Isolated Tumor Cells (=0.2 mm or =200 cells): 0      Size of Largest Metastatic Deposit (mm): NA      Extranodal Extension: NA      Distant Metastasis:      Distant Site(s) Involved: Not applicable      Breast Biomarker Testing Performed on Previous Biopsy:      Testing Performed on Case Number: [id number]      Estrogen Receptor: 100%, positive, strong staining intensity      Progesterone Receptor: 95%, positive, moderate staining intensity      HER2: Negative, 0      Pathologic Stage Classification (pTNM, AJCC 8th Edition): pT1b, pN0      Representative Tumor Block: 2/A      Comment(s): None      (v4.5.0.0)     05/20/2023 Procedure   S/p re-excision.   1. 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       - CHANGES CONSISTENT WITH PRIOR LUMPTECTOMY  SITE.       - UNREMARKABLE SKIN.     Today patient presents to discuss pathology results. She has establish care with radiation oncology for adjuvant radiation. She has no complaints of recent surgical sites.  Healing well.   MEDICAL HISTORY:  Past Medical History:  Diagnosis Date   Anal fissure 12/09/2016   Added automatically from request for surgery [id number]    Added automatically from request for surgery [id number]     Arthralgia of left knee 12/03/2020   Asthma 08/29/2022   Chronic/stable.    Follows with Dr. Westley Hummer, pulmonology.   Using Flovent and albuterol inhaler as needed.     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     Heart murmur    History of total knee arthroplasty 08/21/2021   Hyperlipidemia 08/29/2022   Lab Results   Component Value Date    Cholesterol 197 01/28/2022      Lab Results   Component Value Date    HDL 56 01/28/2022      Lab Results   Component Value Date    LDL Calculated 115 01/28/2022      Lab Results   Component Value Date    Triglycerides 131 01/28/2022  Lab Results   Component Value Date    Chol/HDL Ratio 3.5 01/28/2022      Criteria used to determine recommendation include:    Hypertension    Invasive carcinoma of breast (HCC)    Liver cirrhosis secondary to NASH (HCC)    Lung nodule    Major depressive disorder in full remission (HCC) 04/02/2020   Mood stable on venlafaxine 150 mg daily.     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 breast of female, estrogen receptor positive (HCC)    Migraine 08/29/2022   NAFLD (nonalcoholic fatty liver disease) 40/34/7425   Non-alcoholic cirrhosis  (HCC)    Osteoarthritis of left knee 09/03/2020   Peritoneal hematoma 07/07/2019   Sprain of MCL (medial collateral ligament) of knee 09/03/2020   Symptom associated with female genital organs 07/09/2010    SURGICAL HISTORY: Past Surgical History:  Procedure Laterality Date   ABDOMINAL HYSTERECTOMY     AXILLARY SENTINEL NODE BIOPSY Left 04/22/2023   Procedure: AXILLARY SENTINEL NODE BIOPSY;  Surgeon: Campbell Lerner, MD;  Location: [facility];  Service: General;  Laterality: Left;   BREAST BIOPSY Left 03/31/2023   [place] Core bx, coil clip - path pending   BREAST BIOPSY Left 03/31/2023   [place] LT BREAST BX W LOC DEV 1ST LESION IMG BX SPEC US GUIDE 03/31/2023 [facility]   BREAST BIOPSY Left 04/20/2023   [place] LT RADIO FREQUENCY TAG LOC US GUIDE 04/20/2023 [facility]   BREAST LUMPECTOMY WITH RADIOFREQUENCY TAG IDENTIFICATION Left 04/22/2023   Procedure: BREAST LUMPECTOMY WITH RADIOFREQUENCY TAG IDENTIFICATION;  Surgeon: Campbell Lerner, MD;  Location: [facility];  Service: General;  Laterality: Left;   CARDIAC CATHETERIZATION  03/10/2012   Negative   CATARACT EXTRACTION Bilateral    CESAREAN SECTION     CHOLECYSTECTOMY     RE-EXCISION OF BREAST LUMPECTOMY Left 05/20/2023   Procedure: EXCISION, LESION, BREAST, REPEAT;  Surgeon: Campbell Lerner, MD;  Location: [facility];  Service: General;  Laterality: Left;   SPLENECTOMY     TONSILLECTOMY      SOCIAL HISTORY: Social History   Socioeconomic History   Marital status: Legally Separated    Spouse name: Not on file   Number of children: 4   Years of education: Not on file   Highest education level: GED or equivalent  Occupational History   Occupation: Retired  Tobacco Use   Smoking status: Former    Current packs/day: 0.00    Average packs/day: 2.0 packs/day for 7.2 years (14.5 ttl pk-yrs)    Types: Cigarettes    Start date: 40    Quit date: 06/10/1983    Years since quitting: 39.9   Smokeless tobacco: Never  Vaping Use    Vaping status: Never Used  Substance and Sexual Activity   Alcohol use: No   Drug use: No   Sexual activity: Not on file  Other Topics Concern   Not on file  Social History Narrative   Not on file   Social Drivers of Health   Financial Resource Strain: Low Risk  (04/13/2023)   Overall Financial Resource Strain (CARDIA)    Difficulty of Paying Living Expenses: Not very hard  Food Insecurity: No Food Insecurity (04/13/2023)   Hunger Vital Sign    Worried About Running Out of Food in the Last Year: Never true    Ran Out of Food in the Last Year: Never true  Transportation Needs: No Transportation Needs (04/13/2023)   PRAPARE - Administrator, Civil Service (Medical): No  Lack of Transportation (Non-Medical): No  Physical Activity: Unknown (04/13/2023)   Exercise Vital Sign    Days of Exercise per Week: 0 days    Minutes of Exercise per Session: Not on file  Stress: No Stress Concern Present (04/13/2023)   [employer] of Occupational Health - Occupational Stress Questionnaire    Feeling of Stress : Not at all  Social Connections: Moderately Isolated (04/13/2023)   Social Connection and Isolation Panel [NHANES]    Frequency of Communication with Friends and Family: More than three times a week    Frequency of Social Gatherings with Friends and Family: Twice a week    Attends Religious Services: More than 4 times per year    Active Member of [employer] or Organizations: No    Attends Engineer, structural: Not on file    Marital Status: Separated  Intimate Partner Violence: Not At Risk (04/08/2023)   Humiliation, Afraid, Rape, and Kick questionnaire    Fear of Current or Ex-Partner: No    Emotionally Abused: No    Physically Abused: No    Sexually Abused: No    FAMILY HISTORY: Family History  Problem Relation Age of Onset   Transient ischemic attack Mother    Thyroid disease Mother    Arthritis Mother    Hearing loss Mother    High blood pressure Mother     High Cholesterol Mother    Miscarriages / Stillbirths Mother    Stroke Mother    Cancer Father 31       metastatic, possible lung or prostate primary   Arthritis Father    Asthma Father    COPD Father    Depression Father    Early death Father    Hearing loss Father    Heart disease Father    High Cholesterol Father    Breast cancer Sister 74   Arthritis Sister    Birth defects Sister    Cancer Sister        breast cancer   Diabetes Sister    High blood pressure Sister    Diabetes Paternal Grandmother    Arthritis Paternal Grandfather    Cancer Paternal Grandfather    Breast cancer Daughter 20       TNBC   Cervical cancer Daughter    Breast cancer Niece 84    ALLERGIES:  is allergic to penicillins, codeine, and zoloft [sertraline hcl].  MEDICATIONS:  Current Outpatient Medications  Medication Sig Dispense Refill   acetaminophen (TYLENOL) 500 MG tablet Take 1,000 mg by mouth every 6 (six) hours as needed.     albuterol (PROVENTIL HFA) 108 (90 BASE) MCG/ACT inhaler Inhale 1 puff into the lungs every 6 (six) hours as needed for wheezing or shortness of breath.     Calcium Carbonate-Vitamin D (OYSTER SHELL CALCIUM/D) 500-5 MG-MCG TABS Take 1 tablet by mouth daily.     clobetasol cream (TEMOVATE) 0.05 % Apply 1 Application topically 2 (two) times daily. (Patient taking differently: Apply 1 Application topically daily as needed.) 60 g 0   EFFEXOR XR 150 MG 24 hr capsule Take 150 mg by mouth daily with breakfast.     ezetimibe (ZETIA) 10 MG tablet Take 10 mg by mouth at bedtime.     gabapentin (NEURONTIN) 100 MG capsule Take 1 capsule (100 mg total) by mouth 2 (two) times daily. 120 capsule 0   HYDROcodone-acetaminophen (NORCO/VICODIN) 5-325 MG tablet Take 1 tablet by mouth every 6 (six) hours as needed for moderate pain (pain  score 4-6). 15 tablet 0   lisinopril-hydrochlorothiazide (ZESTORETIC) 20-12.5 MG tablet Take 1 tablet by mouth every morning.     loratadine (CLARITIN) 10  MG tablet Take 10 mg by mouth daily as needed for allergies.     metFORMIN (GLUCOPHAGE-XR) 500 MG 24 hr tablet Take 1,000 mg by mouth 2 (two) times daily with a meal.     omeprazole (PRILOSEC) 20 MG capsule Take 1 capsule (20 mg total) by mouth daily. (Patient taking differently: Take 20 mg by mouth every morning.) 90 capsule 0   pravastatin (PRAVACHOL) 80 MG tablet Take 80 mg by mouth at bedtime.     No current facility-administered medications for this visit.    Review of Systems  Constitutional:  Negative for appetite change, chills, fatigue and fever.  HENT:   Negative for hearing loss and voice change.   Eyes:  Negative for eye problems.  Respiratory:  Negative for chest tightness and cough.   Cardiovascular:  Negative for chest pain.  Gastrointestinal:  Negative for abdominal distention, abdominal pain and blood in stool.  Endocrine: Negative for hot flashes.  Genitourinary:  Negative for difficulty urinating and frequency.   Musculoskeletal:  Negative for arthralgias.  Skin:  Negative for itching and rash.  Neurological:  Negative for extremity weakness.  Hematological:  Negative for adenopathy.  Psychiatric/Behavioral:  Negative for confusion.      PHYSICAL EXAMINATION: ECOG PERFORMANCE STATUS: 1 - Symptomatic but completely ambulatory  Vitals:   05/27/23 1424  BP: 135/67  Pulse: 83  Resp: 18  Temp: 98.5 F (36.9 C)  SpO2: 98%   Filed Weights   05/27/23 1424  Weight: 160 lb 4.8 oz (72.7 kg)    Physical Exam HENT:     Head: Normocephalic and atraumatic.  Eyes:     General: No scleral icterus. Cardiovascular:     Rate and Rhythm: Normal rate and regular rhythm.  Pulmonary:     Effort: Pulmonary effort is normal. No respiratory distress.  Abdominal:     General: There is no distension.  Musculoskeletal:        General: Normal range of motion.     Cervical back: Normal range of motion and neck supple.  Skin:    Findings: No erythema.  Neurological:      Mental Status: She is alert and oriented to person, place, and time. Mental status is at baseline.     Motor: No abnormal muscle tone.  Psychiatric:        Mood and Affect: Mood and affect normal.      LABORATORY DATA:  I have reviewed the data as listed    Latest Ref Rng & Units 04/08/2023   12:14 PM 06/10/2013    7:10 AM 06/08/2013    8:20 PM  CBC  WBC 4.0 - 10.5 K/uL 9.8  8.4  10.0   Hemoglobin 12.0 - 15.0 g/dL 62.9  52.8  41.3   Hematocrit 36.0 - 46.0 % 39.3  33.2  38.5   Platelets 150 - 400 K/uL 396  167  205       Latest Ref Rng & Units 05/14/2023    8:25 AM 03/12/2023   11:44 AM 06/10/2013    7:10 AM  CMP  Glucose 70 - 99 mg/dL 244  010  272   BUN 8 - 23 mg/dL 24  21  20    Creatinine 0.44 - 1.00 mg/dL 5.36  6.44  0.34   Sodium 135 - 145 mmol/L 140  138  143   Potassium 3.5 - 5.1 mmol/L 3.7  4.4  4.0   Chloride 98 - 111 mmol/L 105  100  106   CO2 22 - 32 mmol/L 24  28  27    Calcium 8.9 - 10.3 mg/dL 9.5  9.8  9.2   Total Protein 6.0 - 8.3 g/dL  7.7  6.6   Total Bilirubin 0.2 - 1.2 mg/dL  0.4  0.3   Alkaline Phos 39 - 117 U/L  112  108   AST 0 - 37 U/L  45  32   ALT 0 - 35 U/L  35  47      RADIOGRAPHIC STUDIES: I have personally reviewed the radiological images as listed and agreed with the findings in the report. US ABDOMEN LIMITED RUQ (LIVER/GB) Result Date: 05/04/2023 : PROCEDURE: ULTRASOUND ABDOMEN LIMITED HISTORY: Patient is a 73 y/o F with cirrhosis secondary to NASH. Status post cholecystectomy and splenectomy. COMPARISON: CT AP 07/03/2009. TECHNIQUE: Two-dimensional grayscale and color Doppler ultrasound of the limited abdomen was performed. FINDINGS: The liver demonstrates a coarsened and nodular echotexture without intrahepatic biliary dilatation. No masses are visualized. There is normal hepatopetal flow visualized within the main portal vein. The gallbladder is surgically absent. The common bile duct measures 0.5 cm. A simple cyst measuring 5.3 cm is incidentally  noted within the right kidney. IMPRESSION: 1. Coarsened and nodular hepatic echotexture, consistent with cirrhotic changes. No mass. 2.  Surgically absent gallbladder.  No biliary dilatation. 3.  Simple cyst incidentally noted within the right kidney. Thank you for allowing [place] to assist in the care of this patient. Electronically Signed   By: Lestine Box M.D.   On: 05/04/2023 22:01

## 2023-05-27 NOTE — Assessment & Plan Note (Addendum)
 Left breast invasive carcinoma G3, with high-grade DCIS s/p lumpectomy SLNB, positive margin, s/p re-excision.  pT1b pN0 ER100%, PR 95% HER2 (0) Oncotype Dx score 21, will not offer adjuvant chemotherapy.  Recommend adjuvant radiation followed by adjuvant endocrine therapy.

## 2023-05-27 NOTE — Assessment & Plan Note (Signed)
 Will obtain baseline bone density

## 2023-05-27 NOTE — Therapy (Signed)
 OUTPATIENT OCCUPATIONAL THERAPY BREAST CANCER POST OP VISIT   Patient Name: Bianca Rogers MRN: [id number] DOB:1950-12-22, 73 y.o., female Today's Date: 05/27/2023  END OF SESSION:  OT End of Session - 05/27/23 1733     Visit Number 2    Number of Visits 5    Date for OT Re-Evaluation 06/10/23    OT Start Time 1440    OT Stop Time 1510    OT Time Calculation (min) 30 min    Activity Tolerance Patient tolerated treatment well    Behavior During Therapy [facility] for tasks assessed/performed             Past Medical History:  Diagnosis Date   Anal fissure 12/09/2016   Added automatically from request for surgery [id number]    Added automatically from request for surgery [id number]     Arthralgia of left knee 12/03/2020   Asthma 08/29/2022   Chronic/stable.    Follows with Dr. Westley Hummer, pulmonology.   Using Flovent and albuterol inhaler as needed.     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     Heart murmur    History of total knee arthroplasty 08/21/2021   Hyperlipidemia 08/29/2022   Lab Results   Component Value Date    Cholesterol 197 01/28/2022      Lab Results   Component Value Date    HDL 56 01/28/2022      Lab Results   Component Value Date    LDL Calculated 115 01/28/2022      Lab Results   Component Value Date    Triglycerides 131 01/28/2022      Lab Results   Component Value Date    Chol/HDL Ratio 3.5 01/28/2022      Criteria used to determine recommendation include:    Hypertension    Invasive carcinoma of breast (HCC)    Liver cirrhosis secondary to NASH (HCC)    Lung nodule    Major depressive disorder in full remission  (HCC) 04/02/2020   Mood stable on venlafaxine 150 mg daily.     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 breast of female, estrogen receptor positive (HCC)    Migraine 08/29/2022   NAFLD (nonalcoholic fatty liver disease) 57/84/6962   Non-alcoholic cirrhosis (HCC)    Osteoarthritis of left knee 09/03/2020   Peritoneal hematoma 07/07/2019   Sprain of MCL (medial collateral ligament) of knee 09/03/2020   Symptom associated with female genital organs 07/09/2010   Past Surgical History:  Procedure Laterality Date   ABDOMINAL HYSTERECTOMY     AXILLARY SENTINEL NODE BIOPSY Left 04/22/2023   Procedure: AXILLARY SENTINEL NODE BIOPSY;  Surgeon: Campbell Lerner, MD;  Location: [facility];  Service: General;  Laterality: Left;   BREAST BIOPSY Left 03/31/2023   [place] Core bx, coil clip - path pending   BREAST BIOPSY Left 03/31/2023   [place] LT BREAST BX W LOC DEV 1ST LESION IMG BX SPEC US GUIDE 03/31/2023 [facility]   BREAST BIOPSY Left 04/20/2023   [place] LT RADIO FREQUENCY TAG LOC US GUIDE 04/20/2023 [facility]   BREAST LUMPECTOMY WITH RADIOFREQUENCY TAG IDENTIFICATION Left 04/22/2023   Procedure: BREAST  LUMPECTOMY WITH RADIOFREQUENCY TAG IDENTIFICATION;  Surgeon: Campbell Lerner, MD;  Location: [facility];  Service: General;  Laterality: Left;   CARDIAC CATHETERIZATION  03/10/2012   Negative   CATARACT EXTRACTION Bilateral    CESAREAN SECTION     CHOLECYSTECTOMY     RE-EXCISION OF BREAST LUMPECTOMY Left 05/20/2023   Procedure: EXCISION, LESION, BREAST, REPEAT;  Surgeon: Campbell Lerner, MD;  Location: [facility];  Service: General;  Laterality: Left;   SPLENECTOMY     TONSILLECTOMY     Patient Active Problem List   Diagnosis Date Noted   Genetic testing 05/15/2023   Change in facial mole 04/17/2023   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 breast of female, estrogen receptor positive (HCC) 04/09/2023   Invasive carcinoma of breast (HCC)  04/08/2023   Family history of breast cancer 04/08/2023   Gastroesophageal reflux disease 03/02/2023   Liver cirrhosis secondary to NASH (HCC) 03/02/2023   Bronchiectasis (HCC) 06/09/2013   Type 2 diabetes mellitus with diabetic neuropathy (HCC) 06/09/2013   HTN (hypertension), benign 06/09/2013    PCP: Dr Ronney Lion PROVIDER: Dr Cathie Hoops  REFERRING DIAG: L breast Cancer  THERAPY DIAG:  Abnormal posture  Rationale for Evaluation and Treatment: Rehabilitation  ONSET DATE: 04/22/23  SUBJECTIVE:                                                                                                                                                                                           SUBJECTIVE STATEMENT: Patient report she had 04/22/2023 left lumpectomy.  But then had a repeat excision on 05/20/2023.  Patient has been working on her range of motion on the left.  Preparing her for radiation.  But report still continues to increase discomfort with right shoulder but she had problems with disks in her neck that cause her in the past pain in the R shoulder and arm  PERTINENT HISTORY:  Patient was diagnosed with L  breast cancer -she had L Lumpectomy on 04/22/23 by Dr Claudine Mouton with a reexcision of lesion on 05/20/2023-met with oncologist today.  Plan is to have radiation and chemo probably in about 2 weeks.  PATIENT GOALS:   reduce lymphedema risk and learn post op HEP.   PAIN:  Are you having pain? No  PRECAUTIONS: Active CA      HAND DOMINANCE: right  WEIGHT BEARING RESTRICTIONS: No  FALLS:  Has patient fallen in last 6 months? 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, Scientist, physiological and driving  LEISURE:  Read, watch TV   OBJECTIVE:  COGNITION: Overall cognitive status: Within functional limits for tasks assessed    POSTURE:  Forward head and rounded shoulders posture -had in  the past cervical issues with pain radiating in right shoulder and arm  UPPER EXTREMITY AROM/PROM:  A/PROM RIGHT   eval   Shoulder extension   Shoulder flexion 160  Shoulder abduction `160  Shoulder internal rotation 80  Shoulder external rotation     (Blank rows = not tested)  A/PROM LEFT   eval L  05/27/23  Shoulder extension    Shoulder flexion 170 145  Shoulder abduction 170 145  Shoulder internal rotation    Shoulder external rotation 80 80    (Blank rows = not tested)  CERVICAL AROM: NT -denies any issues at this time   UPPER EXTREMITY STRENGTH: 5-/5 in bilateral shoulders  LYMPHEDEMA ASSESSMENTS:   LANDMARK RIGHT   eval  10 cm proximal to olecranon process   Olecranon process   10 cm proximal to ulnar styloid process   Just proximal to ulnar styloid process   Across hand at thumb web space   At base of 2nd digit   (Blank rows = not tested)  LANDMARK LEFT   eval  10 cm proximal to olecranon process   Olecranon process   10 cm proximal to ulnar styloid process   Just proximal to ulnar styloid process   Across hand at thumb web space   At base of 2nd digit   (Blank rows = not tested)  L-DEX LYMPHEDEMA SCREENING:  The patient was assessed using the L-Dex machine today to produce a lymphedema index baseline score. The patient will be reassessed on a regular basis (typically every 3 months) to obtain new L-Dex scores. If the score is > 6.5 points away from his/her baseline score indicating onset of subclinical lymphedema, it will be recommended to wear a compression garment for 4 weeks, 12 hours per day and then be reassessed. If the score continues to be > 6.5 points from baseline at reassessment, we will initiate lymphedema treatment. Assessing in this manner has a 95% rate of preventing clinically significant lymphedema.   L-DEX FLOWSHEETS - 04/15/23 1700       L-DEX LYMPHEDEMA SCREENING   Measurement Type Unilateral    L-DEX MEASUREMENT EXTREMITY Upper  Extremity    POSITION  Standing    DOMINANT SIDE Right    At Risk Side Left   BASELINE SCORE (UNILATERAL) 0.7            Did not assess today postop edema.  Will reassess next session.  TREATMENT 05/27/23:  Assess patient's active range of motion postop today.  Patient with decreased endrange shoulder abduction external rotation more than flexion on the left. Patient had pre-existing issues with the right shoulder because of cervical issues. Change patient's home program to active assisted range of motion on wall for left shoulder flexion and abduction pain-free 12 reps 2-3 times a day Followed by external rotation in supine 10 reps After review of home exercises patient was able to simulate radiation position with increased ease and less discomfort. Patient report some irritation on the bottom of the breast where 2 incisions worse.  Bra or rubs. Patient following up with Dr. Claudine Mouton tomorrow.  PATIENT EDUCATION:  Education details: Lymphedema risk reduction and post op shoulder/posture HEP Person educated: Patient Education method: Explanation, Demonstration, Handout Education comprehension: Patient verbalized understanding and returned demonstration  HOME EXERCISE PROGRAM: See treatment note  ASSESSMENT:  CLINICAL IMPRESSION: Patient is following up with OT after having a left lumpectomy on 04/22/2023 by Dr. Claudine Mouton with a reexcision of lesion on 05/20/2023.  Patient making great progress in active range of motion in the left shoulder in all ranges and planes.  Did change and upgrade patient's home program to active assisted range of motion pain-free on the wall.  With great progress afterwards and with less discomfort simulating radiation position.  Patient to continue with range of motion home exercises 2-3 times a day and follow-up with me in 2 weeks.  Will also repeat at that time her L-Dex score with SOZO-  she will benefit from continued OT services to increase range of  motion in Lupper extremity as well as to determine needs and from L-Dex screens every 3 months for 2 years to detect subclinical lymphedema.  Pt will benefit from skilled therapeutic intervention to improve on the following deficits: Decreased knowledge of precautions and lymphedema education, impaired UE functional use, pain, decreased ROM, postural dysfunction.   OT treatment/interventions: ADL/self-care home management, pt/family education, therapeutic exercise,manual therapy  REHAB POTENTIAL: Good  CLINICAL DECISION MAKING: Stable/uncomplicated  EVALUATION COMPLEXITY: Low   GOALS: Goals reviewed with patient? YES  LONG TERM GOALS: (STG=LTG)    Name Target Date Goal status  1 Pt will be able to verbalize understanding of pertinent lymphedema risk reduction practices relevant to her dx specifically related to skin care.  Baseline:  No knowledge 8 weeks Ongoing  2 Pt will be able to return demo and/or verbalize understanding of the post op HEP related to regaining shoulder ROM. Baseline:  No knowledge 04/15/2023 Achieved at eval       4 Pt will demo she has regained full shoulder ROM and function post operatively compared to baselines.  Baseline: See objective measurements taken today. 8 weeks Ongoing    PLAN:  OT FREQUENCY/DURATION: EVAL and 3 follow up appointment.   PLAN FOR NEXT SESSION: L-Dex score and assess progress in range of motion and able to tolerate radiation position   If the patient requires occupational therapy at that time, a specific plan will be dictated and sent to the referring physician for approval. T Occupational Therapy Information for After Breast Cancer Surgery/Treatment:  Lymphedema is a swelling condition that you may be at risk for in your arm if you have lymph nodes removed from the armpit area.  After a sentinel node biopsy, the risk is approximately 5-9% and is higher after an axillary node dissection.  There is treatment available for this  condition and it is not life-threatening.  Contact your physician or occupational therapist with concerns. You may begin the 4 shoulder/posture exercises (see additional sheet) when permitted by your physician (typically a week after surgery).  If you have drains, you may need to wait until those are removed before beginning range of motion exercises.  A general recommendation is to not lift your arms above shoulder height until drains are removed.  These exercises should be done to your tolerance and gently.  This is not a "no pain/no gain" type of recovery so listen to your body and stretch into the range of motion that you can tolerate, stopping if you have pain.  If you are having immediate reconstruction, ask your plastic surgeon about doing exercises as he or she may want you to wait. .  While undergoing any medical procedure or treatment, try to avoid blood pressure being taken or needle sticks from occurring on the arm  on the side of cancer.   This recommendation begins after surgery and continues for the rest of your life.  This may help reduce your risk of getting lymphedema (swelling in your arm). An excellent resource for those seeking information on lymphedema is the National Lymphed[employer] Network's web site. It can be accessed at www.lymphnet.org If you notice swelling in your hand, arm or breast at any time following surgery (even if it is many years from now), please contact your doctor or occupational therapist to discuss this.  Lymphedema can be treated at any time but it is easier for you if it is treated early on.  If you feel like your shoulder motion is not returning to normal in a reasonable amount of time, please contact your surgeon or occupational therapist.  [facility] [phone]. [address], [place], [place] [address]       Oletta Cohn, OTR/L,CLT 05/27/2023, 5:34 PM

## 2023-06-01 ENCOUNTER — Ambulatory Visit: Payer: Medicare HMO | Admitting: Oncology

## 2023-06-01 NOTE — Progress Notes (Unsigned)
 [facility] POST-OP OFFICE VISIT  06/02/2023  HPI: Bianca Rogers is a 73 y.o. female had surgery on  May 20, 2023 , now s/p reexcision of left inferior breast site with pathology below.   She is quite relieved to have it fully removed, and with sufficient margin present to move on.  She is aware it was not ideal.  She had some pain, has experienced some shooting pains in the breast but not intolerable.  She avoided using hydrocodone.  She denies any fevers chills, or vomiting.   1. 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       - CHANGES CONSISTENT WITH PRIOR LUMPTECTOMY SITE.       - UNREMARKABLE SKIN.    Vital signs: BP (!) 143/83   Pulse 90   Temp 98.2 F (36.8 C) (Oral)   Ht 5\' 4"  (1.626 m)   Wt 159 lb 3.2 oz (72.2 kg)   SpO2 99%   BMI 27.33 kg/m    Physical Exam: Constitutional: She appears well.  Skin: All her incisions appear to be clean dry and intact, the reexcision site has some minimal amount of Dermabond present still but there is no evidence of ecchymosis, erythema or induration.  Assessment/Plan: This is a 73 y.o. 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  -Expected to follow-up with clinical examinations at least annually, and most likely following her diagnostic mammogram of the left breast in about 6 months.   Campbell Lerner M.D., FACS 06/02/2023, 10:02 AM

## 2023-06-02 ENCOUNTER — Encounter: Payer: Self-pay | Admitting: Surgery

## 2023-06-02 ENCOUNTER — Ambulatory Visit (INDEPENDENT_AMBULATORY_CARE_PROVIDER_SITE_OTHER): Admitting: Surgery

## 2023-06-02 VITALS — BP 143/83 | HR 90 | Temp 98.2°F | Ht 64.0 in | Wt 159.2 lb

## 2023-06-02 DIAGNOSIS — Z08 Encounter for follow-up examination after completed treatment for malignant neoplasm: Secondary | ICD-10-CM

## 2023-06-02 DIAGNOSIS — C50512 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 female breast: Secondary | ICD-10-CM

## 2023-06-02 DIAGNOSIS — Z170 Estrogen receptor positive status [ER+]: Secondary | ICD-10-CM

## 2023-06-02 NOTE — Patient Instructions (Signed)
Lumpectomy, Care After The following information offers guidance on how to care for yourself after your procedure. Your health care provider may also give you more specific instructions. If you have problems or questions, contact your health care provider. What can I expect after the procedure? After the procedure, it is common to have: Some pain or redness at the incision site. Breast swelling. Breast tenderness. Stiffness in your arm or shoulder. A change in the shape and feel of your breast. Scar tissue that feels hard to the touch in the area where the lump was removed. Follow these instructions at home: Medicines Take over-the-counter and prescription medicines only as told by your health care provider. If you were prescribed an antibiotic, take it as told by your health care provider. Do not stop taking the antibiotic even if you start to feel better. Ask your health care provider if the medicine prescribed to you: Requires you to avoid driving or using machinery. Can cause constipation. You may need to take these actions to prevent or treat constipation: Drink enough fluid to keep your urine pale yellow. Take over-the-counter or prescription medicines. Eat foods that are high in fiber, such as beans, whole grains, and fresh fruits and vegetables. Limit foods that are high in fat and processed sugars, such as fried or sweet foods. Incision care     Follow instructions from your health care provider about how to take care of your incision. Make sure you: Wash your hands with soap and water for at least 20 seconds before and after you change your bandage (dressing). If soap and water are not available, use hand sanitizer. Change your dressing as told by your health care provider. Leave stitches (sutures), skin glue, or adhesive strips in place. These skin closures may need to stay in place for 2 weeks or longer. If adhesive strip edges start to loosen and curl up, you may trim the  loose edges. Do not remove adhesive strips completely unless your health care provider tells you to do that. Check your incision area every day for signs of infection. Check for: More redness, swelling, or pain. Fluid or blood. Warmth. Pus or a bad smell. Keep your dressing clean and dry. If you were sent home with a surgical drain in place, follow instructions from your health care provider about emptying it. Bathing Do not take baths, swim, or use a hot tub until your health care provider approves. Ask your health care provider if you may take showers. You may only be allowed to take sponge baths. Activity Rest as told by your health care provider. Do not sit for a long time without moving. Get up to take short walks every 1-2 hours. This will improve blood flow and breathing. Ask for help if you feel weak or unsteady. Be careful to avoid any activities that could cause an injury to your arm on the side of your surgery. Do not lift anything that is heavier than 10 lb (4.5 kg), or the limit that you are told, until your health care provider says that it is safe. Avoid lifting with the arm that is on the side of your surgery. Do not carry heavy objects on your shoulder on the side of your surgery. Do exercises to keep your shoulder and arm from getting stiff and swollen. Talk with your health care provider about which exercises are safe for you. Return to your normal activities as told by your health care provider. Ask your health care provider what activities  are safe for you. General instructions Wear a supportive bra as told by your health care provider. Raise (elevate) your arm above the level of your heart while you are sitting or lying down. Do not wear tight jewelry on your arm, wrist, or fingers on the side of your surgery. Wear compression stockings as told by your health care provider. These stockings help to prevent blood clots and reduce swelling in your legs. If you had any lymph  nodes removed during your procedure, be sure to tell all of your health care providers. It is important to share this information before you have certain procedures, such as blood tests or blood pressure measurements. Keep all follow-up visits. You may need to be screened for extra fluid around the lymph nodes and swelling in the breast and arm (lymphedema). Contact a health care provider if: You develop a rash. You have a fever. Your pain worsens or pain medicine is not working. You have swelling, weakness, or numbness in your arm that does not improve after a few weeks. You have new swelling in your breast. You have any of these signs of infection: More redness, swelling, or pain in your incision area. Fluid or blood coming from your incision. Warmth coming from the incision area. Pus or a bad smell coming from your incision. Get help right away if: You have very bad pain in your breast or arm. You have swelling in your legs or arms. You have redness, warmth, or pain in your leg or arm. You have chest pain. You have difficulty breathing. These symptoms may be an emergency. Get help right away. Call 911. Do not wait to see if the symptoms will go away. Do not drive yourself to the hospital. Summary After the procedure, it is common to have breast tenderness, swelling in your breast, and stiffness in your arm and shoulder. Follow instructions from your health care provider about how to take care of your incision. Do not lift anything that is heavier than 10 lb (4.5 kg), or the limit that you are told, until your health care provider says that it is safe. Avoid lifting with the arm that is on the side of your surgery. If you had any lymph nodes removed during your procedure, be sure to tell all of your health care providers. This information is not intended to replace advice given to you by your health care provider. Make sure you discuss any questions you have with your health care  provider. Document Revised: 05/05/2021 Document Reviewed: 05/05/2021 Elsevier Patient Education  2024 [employer].

## 2023-06-03 ENCOUNTER — Encounter: Payer: Self-pay | Admitting: *Deleted

## 2023-06-03 ENCOUNTER — Ambulatory Visit
Admission: RE | Admit: 2023-06-03 | Discharge: 2023-06-03 | Disposition: A | Discharge status: Home / Self Care | Source: Ambulatory Visit | Attending: Radiation Oncology | Admitting: Radiation Oncology

## 2023-06-03 ENCOUNTER — Ambulatory Visit (INDEPENDENT_AMBULATORY_CARE_PROVIDER_SITE_OTHER): Payer: Medicare HMO | Admitting: Student in an Organized Health Care Education/Training Program

## 2023-06-03 ENCOUNTER — Ambulatory Visit: Attending: Oncology | Admitting: Occupational Therapy

## 2023-06-03 ENCOUNTER — Encounter: Payer: Self-pay | Admitting: Student in an Organized Health Care Education/Training Program

## 2023-06-03 VITALS — BP 118/58 | HR 79 | Resp 97 | Ht 64.0 in | Wt 161.4 lb

## 2023-06-03 DIAGNOSIS — J479 Bronchiectasis, uncomplicated: Secondary | ICD-10-CM

## 2023-06-03 DIAGNOSIS — R293 Abnormal posture: Secondary | ICD-10-CM | POA: Insufficient documentation

## 2023-06-03 DIAGNOSIS — J4540 Moderate persistent asthma, uncomplicated: Secondary | ICD-10-CM | POA: Diagnosis not present

## 2023-06-03 DIAGNOSIS — C50512 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 female breast: Secondary | ICD-10-CM | POA: Diagnosis not present

## 2023-06-03 MED ORDER — FLUTICASONE-SALMETEROL 250-50 MCG/ACT IN AEPB: 1.0000 | INHALATION_SPRAY | Freq: Two times a day (BID) | RESPIRATORY_TRACT | 12 refills | Status: AC

## 2023-06-03 NOTE — Progress Notes (Unsigned)
 Synopsis: Referred in *** by Modesto Charon, NP  Assessment & Plan:   1. Moderate persistent asthma without complication (Primary)  Few episodes of diffucilty breathing after her surgery for breast cancer. Otherwise stable. Could not afford advair HFA. Will send wixela. CT re-assuring, continue to monitor.  - fluticasone-salmeterol (WIXELA INHUB) 250-50 MCG/ACT AEPB; Inhale 1 puff into the lungs in the morning and at bedtime.  Dispense: 60 each; Refill: 12   Return in about 6 months (around 12/04/2023).  I spent *** minutes caring for this patient today, including {EM billing:[id number]}  Raechel Chute, MD [facility] 06/03/2023 2:23 PM    End of visit medications:  Meds ordered this encounter  Medications   fluticasone-salmeterol (WIXELA INHUB) 250-50 MCG/ACT AEPB    Sig: Inhale 1 puff into the lungs in the morning and at bedtime.    Dispense:  60 each    Refill:  12     Current Outpatient Medications:    acetaminophen (TYLENOL) 500 MG tablet, Take 1,000 mg by mouth every 6 (six) hours as needed., Disp: , Rfl:    albuterol (PROVENTIL HFA) 108 (90 BASE) MCG/ACT inhaler, Inhale 1 puff into the lungs every 6 (six) hours as needed for wheezing or shortness of breath., Disp: , Rfl:    Calcium Carbonate-Vitamin D (OYSTER SHELL CALCIUM/D) 500-5 MG-MCG TABS, Take 1 tablet by mouth daily., Disp: , Rfl:    clobetasol cream (TEMOVATE) 0.05 %, Apply 1 Application topically 2 (two) times daily. (Patient taking differently: Apply 1 Application topically daily as needed.), Disp: 60 g, Rfl: 0   EFFEXOR XR 150 MG 24 hr capsule, Take 150 mg by mouth daily with breakfast., Disp: , Rfl:    ezetimibe (ZETIA) 10 MG tablet, Take 10 mg by mouth at bedtime., Disp: , Rfl:    fluticasone-salmeterol (WIXELA INHUB) 250-50 MCG/ACT AEPB, Inhale 1 puff into the lungs in the morning and at bedtime., Disp: 60 each, Rfl: 12   gabapentin (NEURONTIN) 100 MG capsule, Take 1 capsule (100 mg total)  by mouth 2 (two) times daily., Disp: 120 capsule, Rfl: 0   lisinopril-hydrochlorothiazide (ZESTORETIC) 20-12.5 MG tablet, Take 1 tablet by mouth every morning., Disp: , Rfl:    loratadine (CLARITIN) 10 MG tablet, Take 10 mg by mouth daily as needed for allergies., Disp: , Rfl:    metFORMIN (GLUCOPHAGE-XR) 500 MG 24 hr tablet, Take 1,000 mg by mouth 2 (two) times daily with a meal., Disp: , Rfl:    omeprazole (PRILOSEC) 20 MG capsule, Take 1 capsule (20 mg total) by mouth daily. (Patient taking differently: Take 20 mg by mouth every morning.), Disp: 90 capsule, Rfl: 0   pravastatin (PRAVACHOL) 80 MG tablet, Take 80 mg by mouth at bedtime., Disp: , Rfl:    Subjective:   PATIENT ID: Bianca Rogers GENDER: female DOB: [birth year], MRN: [id number]  Chief Complaint  Patient presents with   Follow-up    HPI ***  Ancillary information including prior medications, full medical/surgical/family/social histories, and PFTs (when available) are listed below and have been reviewed.   ROS   Objective:   Vitals:   06/03/23 1354  BP: (!) 118/58  Pulse: 79  Resp: (!) 97  Weight: 161 lb 6.4 oz (73.2 kg)  Height: 5\' 4"  (1.626 m)     on *** LPM *** RA BMI Readings from Last 3 Encounters:  06/03/23 27.70 kg/m  06/02/23 27.33 kg/m  05/27/23 27.52 kg/m   Wt Readings from Last 3 Encounters:  06/03/23  161 lb 6.4 oz (73.2 kg)  06/02/23 159 lb 3.2 oz (72.2 kg)  05/27/23 160 lb 4.8 oz (72.7 kg)    Physical Exam    Ancillary Information    Past Medical History:  Diagnosis Date   Anal fissure 12/09/2016   Added automatically from request for surgery [id number]    Added automatically from request for surgery [id number]     Arthralgia of left knee 12/03/2020   Asthma 08/29/2022   Chronic/stable.    Follows with Dr. Westley Hummer, pulmonology.   Using Flovent and albuterol inhaler as needed.     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     Heart murmur    History of total knee arthroplasty 08/21/2021   Hyperlipidemia 08/29/2022   Lab Results   Component Value Date    Cholesterol 197 01/28/2022      Lab Results   Component Value Date    HDL 56 01/28/2022      Lab Results   Component Value Date    LDL Calculated 115 01/28/2022      Lab Results   Component Value Date    Triglycerides 131 01/28/2022      Lab Results   Component Value Date    Chol/HDL Ratio 3.5 01/28/2022      Criteria used to determine recommendation include:    Hypertension    Invasive carcinoma of breast (HCC)    Liver cirrhosis secondary to NASH (HCC)    Lung nodule    Major depressive disorder in full remission (HCC) 04/02/2020   Mood stable on venlafaxine 150 mg daily.     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 breast of female, estrogen receptor positive (HCC)    Migraine 08/29/2022   NAFLD (nonalcoholic fatty liver disease) 63/87/5643   Non-alcoholic cirrhosis (HCC)    Osteoarthritis of left knee 09/03/2020   Peritoneal hematoma 07/07/2019   Sprain of MCL (medial collateral ligament) of knee 09/03/2020   Symptom associated with female genital organs 07/09/2010     Family History  Problem Relation Age of Onset   Transient ischemic attack Mother    Thyroid disease Mother    Arthritis Mother    Hearing loss Mother    High blood pressure Mother    High Cholesterol Mother    Miscarriages / [place] Mother    Stroke Mother    Cancer Father 95       metastatic, possible lung or prostate primary   Arthritis Father    Asthma Father    COPD Father    Depression Father    Early death Father    Hearing loss Father    Heart disease Father    High  Cholesterol Father    Breast cancer Sister 80   Arthritis Sister    Birth defects Sister    Cancer Sister        breast cancer   Diabetes Sister    High blood pressure Sister    Diabetes Paternal Grandmother    Arthritis Paternal Grandfather    Cancer Paternal Grandfather    Breast cancer Daughter 99  TNBC   Cervical cancer Daughter    Breast cancer Niece 47     Past Surgical History:  Procedure Laterality Date   ABDOMINAL HYSTERECTOMY     AXILLARY SENTINEL NODE BIOPSY Left 04/22/2023   Procedure: AXILLARY SENTINEL NODE BIOPSY;  Surgeon: Campbell Lerner, MD;  Location: [facility];  Service: General;  Laterality: Left;   BREAST BIOPSY Left 03/31/2023   [place] Core bx, coil clip - path pending   BREAST BIOPSY Left 03/31/2023   [place] LT BREAST BX W LOC DEV 1ST LESION IMG BX SPEC US GUIDE 03/31/2023 [facility]   BREAST BIOPSY Left 04/20/2023   [place] LT RADIO FREQUENCY TAG LOC US GUIDE 04/20/2023 [facility]   BREAST LUMPECTOMY WITH RADIOFREQUENCY TAG IDENTIFICATION Left 04/22/2023   Procedure: BREAST LUMPECTOMY WITH RADIOFREQUENCY TAG IDENTIFICATION;  Surgeon: Campbell Lerner, MD;  Location: [facility];  Service: General;  Laterality: Left;   CARDIAC CATHETERIZATION  03/10/2012   Negative   CATARACT EXTRACTION Bilateral    CESAREAN SECTION     CHOLECYSTECTOMY     RE-EXCISION OF BREAST LUMPECTOMY Left 05/20/2023   Procedure: EXCISION, LESION, BREAST, REPEAT;  Surgeon: Campbell Lerner, MD;  Location: [facility];  Service: General;  Laterality: Left;   SPLENECTOMY     TONSILLECTOMY      Social History   Socioeconomic History   Marital status: Legally Separated    Spouse name: Not on file   Number of children: 4   Years of education: Not on file   Highest education level: GED or equivalent  Occupational History   Occupation: Retired  Tobacco Use   Smoking status: Former    Current packs/day: 0.00    Average packs/day: 2.0 packs/day for 7.2 years (14.5 ttl pk-yrs)    Types:  Cigarettes    Start date: 32    Quit date: 06/10/1983    Years since quitting: 40.0   Smokeless tobacco: Never  Vaping Use   Vaping status: Never Used  Substance and Sexual Activity   Alcohol use: No   Drug use: No   Sexual activity: Not on file  Other Topics Concern   Not on file  Social History Narrative   Not on file   Social Drivers of Health   Financial Resource Strain: Low Risk  (04/13/2023)   Overall Financial Resource Strain (CARDIA)    Difficulty of Paying Living Expenses: Not very hard  Food Insecurity: No Food Insecurity (04/13/2023)   Hunger Vital Sign    Worried About Running Out of Food in the Last Year: Never true    Ran Out of Food in the Last Year: Never true  Transportation Needs: No Transportation Needs (04/13/2023)   PRAPARE - Administrator, Civil Service (Medical): No    Lack of Transportation (Non-Medical): No  Physical Activity: Unknown (04/13/2023)   Exercise Vital Sign    Days of Exercise per Week: 0 days    Minutes of Exercise per Session: Not on file  Stress: No Stress Concern Present (04/13/2023)   [employer] of Occupational Health - Occupational Stress Questionnaire    Feeling of Stress : Not at all  Social Connections: Moderately Isolated (04/13/2023)   Social Connection and Isolation Panel [NHANES]    Frequency of Communication with Friends and Family: More than three times a week    Frequency of Social Gatherings with Friends and Family: Twice a week    Attends Religious Services: More than 4 times per year    Active Member of Clubs or  Organizations: No    Attends Banker Meetings: Not on file    Marital Status: Separated  Intimate Partner Violence: Not At Risk (04/08/2023)   Humiliation, Afraid, Rape, and Kick questionnaire    Fear of Current or Ex-Partner: No    Emotionally Abused: No    Physically Abused: No    Sexually Abused: No     Allergies  Allergen Reactions   Penicillins Rash    Other reaction(s):  unknown reaction   Codeine Other (See Comments)    Irritable, insomnia   Zoloft [Sertraline Hcl] Other (See Comments)    Paradoxical response     CBC    Component Value Date/Time   WBC 9.8 04/08/2023 1214   WBC 8.4 06/10/2013 0710   RBC 4.34 04/08/2023 1214   HGB 12.8 04/08/2023 1214   HCT 39.3 04/08/2023 1214   PLT 396 04/08/2023 1214   MCV 90.6 04/08/2023 1214   MCH 29.5 04/08/2023 1214   MCHC 32.6 04/08/2023 1214   RDW 13.7 04/08/2023 1214   LYMPHSABS 4.3 (H) 04/08/2023 1214   MONOABS 1.2 (H) 04/08/2023 1214   EOSABS 0.4 04/08/2023 1214   BASOSABS 0.1 04/08/2023 1214    Pulmonary Functions Testing Results:     No data to display          Outpatient Medications Prior to Visit  Medication Sig Dispense Refill   acetaminophen (TYLENOL) 500 MG tablet Take 1,000 mg by mouth every 6 (six) hours as needed.     albuterol (PROVENTIL HFA) 108 (90 BASE) MCG/ACT inhaler Inhale 1 puff into the lungs every 6 (six) hours as needed for wheezing or shortness of breath.     Calcium Carbonate-Vitamin D (OYSTER SHELL CALCIUM/D) 500-5 MG-MCG TABS Take 1 tablet by mouth daily.     clobetasol cream (TEMOVATE) 0.05 % Apply 1 Application topically 2 (two) times daily. (Patient taking differently: Apply 1 Application topically daily as needed.) 60 g 0   EFFEXOR XR 150 MG 24 hr capsule Take 150 mg by mouth daily with breakfast.     ezetimibe (ZETIA) 10 MG tablet Take 10 mg by mouth at bedtime.     gabapentin (NEURONTIN) 100 MG capsule Take 1 capsule (100 mg total) by mouth 2 (two) times daily. 120 capsule 0   lisinopril-hydrochlorothiazide (ZESTORETIC) 20-12.5 MG tablet Take 1 tablet by mouth every morning.     loratadine (CLARITIN) 10 MG tablet Take 10 mg by mouth daily as needed for allergies.     metFORMIN (GLUCOPHAGE-XR) 500 MG 24 hr tablet Take 1,000 mg by mouth 2 (two) times daily with a meal.     omeprazole (PRILOSEC) 20 MG capsule Take 1 capsule (20 mg total) by mouth daily. (Patient  taking differently: Take 20 mg by mouth every morning.) 90 capsule 0   pravastatin (PRAVACHOL) 80 MG tablet Take 80 mg by mouth at bedtime.     HYDROcodone-acetaminophen (NORCO/VICODIN) 5-325 MG tablet Take 1 tablet by mouth every 6 (six) hours as needed for moderate pain (pain score 4-6). 15 tablet 0   No facility-administered medications prior to visit.

## 2023-06-03 NOTE — Progress Notes (Signed)
 Bianca Rogers will see Dr. Cathie Hoops back after radiation on 07/23/23.

## 2023-06-03 NOTE — Therapy (Signed)
 OUTPATIENT OCCUPATIONAL THERAPY BREAST CANCER POST OP VISIT   Patient Name: Bianca Rogers MRN: [id number] DOB:02-02-1951, 73 y.o., female Today's Date: 06/03/2023  END OF SESSION:  OT End of Session - 06/03/23 0959     Visit Number 3    Number of Visits 5    Date for OT Re-Evaluation 06/10/23    OT Start Time 0925    OT Stop Time 0945    OT Time Calculation (min) 20 min    Activity Tolerance Patient tolerated treatment well    Behavior During Therapy [facility] for tasks assessed/performed             Past Medical History:  Diagnosis Date   Anal fissure 12/09/2016   Added automatically from request for surgery [id number]    Added automatically from request for surgery [id number]     Arthralgia of left knee 12/03/2020   Asthma 08/29/2022   Chronic/stable.    Follows with Dr. Westley Hummer, pulmonology.   Using Flovent and albuterol inhaler as needed.     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     Heart murmur    History of total knee arthroplasty 08/21/2021   Hyperlipidemia 08/29/2022   Lab Results   Component Value Date    Cholesterol 197 01/28/2022      Lab Results   Component Value Date    HDL 56 01/28/2022      Lab Results   Component Value Date    LDL Calculated 115 01/28/2022      Lab Results   Component Value Date    Triglycerides 131 01/28/2022      Lab Results   Component Value Date    Chol/HDL Ratio 3.5 01/28/2022      Criteria used to determine recommendation include:    Hypertension    Invasive carcinoma of breast (HCC)    Liver cirrhosis secondary to NASH (HCC)    Lung nodule    Major depressive disorder in full remission  (HCC) 04/02/2020   Mood stable on venlafaxine 150 mg daily.     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 breast of female, estrogen receptor positive (HCC)    Migraine 08/29/2022   NAFLD (nonalcoholic fatty liver disease) 57/84/6962   Non-alcoholic cirrhosis (HCC)    Osteoarthritis of left knee 09/03/2020   Peritoneal hematoma 07/07/2019   Sprain of MCL (medial collateral ligament) of knee 09/03/2020   Symptom associated with female genital organs 07/09/2010   Past Surgical History:  Procedure Laterality Date   ABDOMINAL HYSTERECTOMY     AXILLARY SENTINEL NODE BIOPSY Left 04/22/2023   Procedure: AXILLARY SENTINEL NODE BIOPSY;  Surgeon: Campbell Lerner, MD;  Location: [facility] ORS;  Service: General;  Laterality: Left;   BREAST BIOPSY Left 03/31/2023   [place] Core bx, coil clip - path pending   BREAST BIOPSY Left 03/31/2023   [place] LT BREAST BX W LOC DEV 1ST LESION IMG BX SPEC US GUIDE 03/31/2023 [facility]   BREAST BIOPSY Left 04/20/2023   [place] LT RADIO FREQUENCY TAG LOC US GUIDE 04/20/2023 [facility]   BREAST LUMPECTOMY WITH RADIOFREQUENCY TAG IDENTIFICATION Left 04/22/2023   Procedure: BREAST  LUMPECTOMY WITH RADIOFREQUENCY TAG IDENTIFICATION;  Surgeon: Campbell Lerner, MD;  Location: [facility] ORS;  Service: General;  Laterality: Left;   CARDIAC CATHETERIZATION  03/10/2012   Negative   CATARACT EXTRACTION Bilateral    CESAREAN SECTION     CHOLECYSTECTOMY     RE-EXCISION OF BREAST LUMPECTOMY Left 05/20/2023   Procedure: EXCISION, LESION, BREAST, REPEAT;  Surgeon: Campbell Lerner, MD;  Location: [facility];  Service: General;  Laterality: Left;   SPLENECTOMY     TONSILLECTOMY     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 breast of female, estrogen receptor positive (HCC) 04/09/2023   Invasive  carcinoma of breast (HCC) 04/08/2023   Family history of breast cancer 04/08/2023   Gastroesophageal reflux disease 03/02/2023   Liver cirrhosis secondary to NASH (HCC) 03/02/2023   Bronchiectasis (HCC) 06/09/2013   Type 2 diabetes mellitus with diabetic neuropathy (HCC) 06/09/2013   HTN (hypertension), benign 06/09/2013    PCP: Dr Ronney Lion PROVIDER: Dr Cathie Hoops  REFERRING DIAG: L breast Cancer  THERAPY DIAG:  Abnormal posture  Rationale for Evaluation and Treatment: Rehabilitation  ONSET DATE: 04/22/23  SUBJECTIVE:                                                                                                                                                                                           SUBJECTIVE STATEMENT: I seen yesterday Dr. Claudine Mouton.  He was very happy with my range of motion.  I do not want to be here today for this radiation appointment.  I am just anxious and dizzy a little bit.  My daughter came with me.  But my motion is doing good -look. PERTINENT HISTORY:  Patient was diagnosed with L  breast cancer -she had L Lumpectomy on 04/22/23 by Dr Claudine Mouton with a reexcision of lesion on 05/20/2023-met with oncologist today.  Plan is to have radiation and chemo probably in about 2 weeks.  PATIENT GOALS:   reduce lymphedema risk and learn post op HEP.   PAIN:  Are you having pain? No  PRECAUTIONS: Active CA      HAND DOMINANCE: right  WEIGHT BEARING RESTRICTIONS: No  FALLS:  Has patient fallen in last 6 months? 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, Scientist, physiological and driving  LEISURE: Read, watch TV   OBJECTIVE:  COGNITION: Overall cognitive status: Within  functional limits for tasks assessed    POSTURE:  Forward head and rounded shoulders posture -had in the past cervical issues with pain radiating in right shoulder and arm  UPPER EXTREMITY  AROM/PROM:  A/PROM RIGHT   eval   Shoulder extension   Shoulder flexion 160  Shoulder abduction `160  Shoulder internal rotation 80  Shoulder external rotation     (Blank rows = not tested)  A/PROM LEFT   eval L  05/27/23 L 06/02/24  Shoulder extension     Shoulder flexion 170 145 170  Shoulder abduction 170 145 170  Shoulder internal rotation     Shoulder external rotation 80 80 80    (Blank rows = not tested)  CERVICAL AROM: NT -denies any issues at this time   UPPER EXTREMITY STRENGTH: 5-/5 in bilateral shoulders  LYMPHEDEMA ASSESSMENTS:   LANDMARK RIGHT   eval  10 cm proximal to olecranon process   Olecranon process   10 cm proximal to ulnar styloid process   Just proximal to ulnar styloid process   Across hand at thumb web space   At base of 2nd digit   (Blank rows = not tested)  LANDMARK LEFT   eval  10 cm proximal to olecranon process   Olecranon process   10 cm proximal to ulnar styloid process   Just proximal to ulnar styloid process   Across hand at thumb web space   At base of 2nd digit   (Blank rows = not tested)  L-DEX LYMPHEDEMA SCREENING:  The patient was assessed using the L-Dex machine today to produce a lymphedema index baseline score. The patient will be reassessed on a regular basis (typically every 3 months) to obtain new L-Dex scores. If the score is > 6.5 points away from his/her baseline score indicating onset of subclinical lymphedema, it will be recommended to wear a compression garment for 4 weeks, 12 hours per day and then be reassessed. If the score continues to be > 6.5 points from baseline at reassessment, we will initiate lymphedema treatment. Assessing in this manner has a 95% rate of preventing clinically significant lymphedema.   L-DEX FLOWSHEETS - 04/15/23 1700       L-DEX LYMPHEDEMA SCREENING   Measurement Type Unilateral    L-DEX MEASUREMENT EXTREMITY Upper Extremity    POSITION  Standing    DOMINANT SIDE Right    At  Risk Side Left   BASELINE SCORE (UNILATERAL) 0.7              TREATMENT 06/03/23:  Assess patient's active range of motion prior to radiation appointment today.  Patient had decreased endrange shoulder abduction external rotation more than flexion on the left last visit. Patient had pre-existing issues with the right shoulder because of cervical issues. Provided patient last visit with home program active assisted range of motion on wall for left shoulder flexion and abduction pain-free 12 reps 2-3 times a day Followed by external rotation in supine 10 reps Patient showed great progress active range of motion within normal limits Patient able to get into radiation position with no increase symptoms Patient has been doing a little bit of scar massage. Patient to continue with range of motion.  Patient little anxious today for radiation appointment and dizzy when standing. Was doing better by the end of the session  SOZO L-dex done -patient within normal limits.  Patient report no lymphedema signs and symptoms.   PATIENT EDUCATION:  Education details: Lymphedema risk reduction and post op  shoulder/posture HEP Person educated: Patient Education method: Explanation, Demonstration, Handout Education comprehension: Patient verbalized understanding and returned demonstration  HOME EXERCISE PROGRAM: See treatment note  ASSESSMENT:  CLINICAL IMPRESSION: Patient is following up with OT after having a left lumpectomy on 04/22/2023 by Dr. Claudine Mouton with a reexcision of lesion on 05/20/2023.  Patient arrive with shoulder active range of motion within normal limits.  Able to get into radiation position.  Patient was performing since last visit upgraded home exercises for active assisted range of motion.    Done this date her L-Dex score with SOZO-within normal limits.  Patient can follow-up with breast navigator for L-Dex score repeat after radiation.  If patient continues with some scar adhesions  or fibrosis she will benefit from continued OT services to decrease pain and increase range of motion in Lupper extremity as well as to determine needs and from L-Dex screens every 3 months for 2 years to detect subclinical lymphedema.  Pt will benefit from skilled therapeutic intervention to improve on the following deficits: Decreased knowledge of precautions and lymphedema education, impaired UE functional use, pain, decreased ROM, postural dysfunction.   OT treatment/interventions: ADL/self-care home management, pt/family education, therapeutic exercise,manual therapy  REHAB POTENTIAL: Good  CLINICAL DECISION MAKING: Stable/uncomplicated  EVALUATION COMPLEXITY: Low   GOALS: Goals reviewed with patient? YES  LONG TERM GOALS: (STG=LTG)    Name Target Date Goal status  1 Pt will be able to verbalize understanding of pertinent lymphedema risk reduction practices relevant to her dx specifically related to skin care.  Baseline:  No knowledge 8 weeks Ongoing  2 Pt will be able to return demo and/or verbalize understanding of the post op HEP related to regaining shoulder ROM. Baseline:  No knowledge 04/15/2023 Achieved at eval       4 Pt will demo she has regained full shoulder ROM and function post operatively compared to baselines.  Baseline: See objective measurements taken today. 06/03/23 Achieved    PLAN:  OT FREQUENCY/DURATION: EVAL and 3 follow up appointment.   PLAN FOR NEXT SESSION: L-Dex score and assess progress in range of motion and able to tolerate radiation position   If the patient requires occupational therapy at that time, a specific plan will be dictated and sent to the referring physician for approval. T Occupational Therapy Information for After Breast Cancer Surgery/Treatment:  Lymphedema is a swelling condition that you may be at risk for in your arm if you have lymph nodes removed from the armpit area.  After a sentinel node biopsy, the risk is approximately  5-9% and is higher after an axillary node dissection.  There is treatment available for this condition and it is not life-threatening.  Contact your physician or occupational therapist with concerns. You may begin the 4 shoulder/posture exercises (see additional sheet) when permitted by your physician (typically a week after surgery).  If you have drains, you may need to wait until those are removed before beginning range of motion exercises.  A general recommendation is to not lift your arms above shoulder height until drains are removed.  These exercises should be done to your tolerance and gently.  This is not a "no pain/no gain" type of recovery so listen to your body and stretch into the range of motion that you can tolerate, stopping if you have pain.  If you are having immediate reconstruction, ask your plastic surgeon about doing exercises as he or she may want you to wait. .  While undergoing any medical procedure or  treatment, try to avoid blood pressure being taken or needle sticks from occurring on the arm on the side of cancer.   This recommendation begins after surgery and continues for the rest of your life.  This may help reduce your risk of getting lymphedema (swelling in your arm). An excellent resource for those seeking information on lymphedema is the National Lymphedema Network's web site. It can be accessed at www.lymphnet.org If you notice swelling in your hand, arm or breast at any time following surgery (even if it is many years from now), please contact your doctor or occupational therapist to discuss this.  Lymphedema can be treated at any time but it is easier for you if it is treated early on.  If you feel like your shoulder motion is not returning to normal in a reasonable amount of time, please contact your surgeon or occupational therapist.  [facility] [phone]. [address] [place], [place] [address]       Oletta Cohn,  OTR/L,CLT 06/03/2023, 10:03 AM

## 2023-06-04 DIAGNOSIS — C50512 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 female breast: Secondary | ICD-10-CM | POA: Diagnosis not present

## 2023-06-05 ENCOUNTER — Other Ambulatory Visit: Payer: Self-pay | Admitting: *Deleted

## 2023-06-05 DIAGNOSIS — C50512 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 female breast: Secondary | ICD-10-CM

## 2023-06-08 ENCOUNTER — Encounter: Payer: Self-pay | Admitting: Oncology

## 2023-06-09 ENCOUNTER — Institutional Professional Consult (permissible substitution): Payer: Medicare HMO | Admitting: Radiation Oncology

## 2023-06-09 ENCOUNTER — Other Ambulatory Visit: Payer: Self-pay | Admitting: General Practice

## 2023-06-09 ENCOUNTER — Ambulatory Visit (INDEPENDENT_AMBULATORY_CARE_PROVIDER_SITE_OTHER): Admitting: General Practice

## 2023-06-09 ENCOUNTER — Encounter: Payer: Self-pay | Admitting: General Practice

## 2023-06-09 VITALS — BP 118/64 | HR 76 | Temp 98.9°F | Ht 64.0 in | Wt 160.0 lb

## 2023-06-09 DIAGNOSIS — E1140 Type 2 diabetes mellitus with diabetic neuropathy, unspecified: Secondary | ICD-10-CM | POA: Diagnosis not present

## 2023-06-09 DIAGNOSIS — D2230 Melanocytic nevi of unspecified part of face: Secondary | ICD-10-CM

## 2023-06-09 DIAGNOSIS — K219 Gastro-esophageal reflux disease without esophagitis: Secondary | ICD-10-CM

## 2023-06-09 DIAGNOSIS — Z7984 Long term (current) use of oral hypoglycemic drugs: Secondary | ICD-10-CM

## 2023-06-09 DIAGNOSIS — L409 Psoriasis, unspecified: Secondary | ICD-10-CM

## 2023-06-09 DIAGNOSIS — K7460 Unspecified cirrhosis of liver: Secondary | ICD-10-CM

## 2023-06-09 DIAGNOSIS — K7581 Nonalcoholic steatohepatitis (NASH): Secondary | ICD-10-CM

## 2023-06-09 MED ORDER — OMEPRAZOLE 20 MG PO CPDR: 20.0000 mg | DELAYED_RELEASE_CAPSULE | Freq: Every day | ORAL | 1 refills | Status: DC

## 2023-06-09 NOTE — Assessment & Plan Note (Addendum)
 Controlled.  Continue Omeprazole 20 mg once daily. Refill sent.

## 2023-06-09 NOTE — Assessment & Plan Note (Signed)
 Last A1c was on 03/12/23-7.2 Continue Metformin 1000 mg BID.   Foot exam completed today.  Pneumonia vaccine up to date.  Had her eye exam at [facility] eye last year. Request sent.   Follow up in 3 months.

## 2023-06-09 NOTE — Assessment & Plan Note (Signed)
 Patient hasn't heard anything regarding her referral.   Placed second referral.

## 2023-06-09 NOTE — Assessment & Plan Note (Signed)
 Has seen GI.  Had the ultrasound done.   Waiting for her endoscopy and colonoscopy.  No concerns today.

## 2023-06-09 NOTE — Progress Notes (Signed)
 Established Patient Office Visit  Subjective   Patient ID: Bianca Rogers, female    DOB: 1950-07-22  Age: 73 y.o. MRN: [id number]  Chief Complaint  Patient presents with   Medical Management of Chronic Issues    3 month f/u    HPI  Bianca Rogers is a 73 year old female with past medical history of HTN, GERD, liver cirrhosis secondary to NASH, type 2 DM with diabetic neuropathy, osteopenia, invasive carcinoma of breast, presents today for a follow up on chronic conditions.  1) Type 2 DM- Current medications include: metformin XR 1000 BID.  She checks her blood sugar daily in the morning, range is between 120s-130s.  Last A1C: 7.2 in January, 2025. Last Eye Exam: July, 2024 at [facility] eye.  Last Foot Exam: due Pneumonia Vaccination: UTD Urine Microalbumin: UTD Statin: pravastatin   Dietary changes since last visit: it has been challenging due to the cancer diagnosis. She does eat of rice. No sodas. Drinks a lot of water.  Exercise: recently started walking more.  NASH cirrhosis : followed by gastroenterology. Liver ultrasound on 05/04/23 showed coarsened and nodule hepatic echo texture, consistent with cirrhotic changes. GI recommend repeat US HCC screening in 6 months. No concerns. She is planning to schedule endoscopy sometime over the summer. She is also due for a colonoscopy and she will discuss with GI.   Dexa scan- patient will call and schedule. She already has the number.    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 breast of female, estrogen receptor positive (HCC) 04/09/2023   Invasive carcinoma of breast (HCC) 04/08/2023   Family history of breast cancer 04/08/2023   Gastroesophageal reflux disease 03/02/2023   Liver cirrhosis secondary to NASH (HCC) 03/02/2023   Bronchiectasis  (HCC) 06/09/2013   Type 2 diabetes mellitus with diabetic neuropathy (HCC) 06/09/2013   HTN (hypertension), benign 06/09/2013   Past Medical History:  Diagnosis Date   Anal fissure 12/09/2016   Added automatically from request for surgery [id number]    Added automatically from request for surgery [id number]     Arthralgia of left knee 12/03/2020   Asthma 08/29/2022   Chronic/stable.    Follows with Dr. Westley Hummer, pulmonology.   Using Flovent and albuterol inhaler as needed.     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     Heart murmur    History of total knee arthroplasty 08/21/2021   Hyperlipidemia 08/29/2022   Lab Results   Component Value Date    Cholesterol 197 01/28/2022      Lab Results   Component Value Date    HDL 56 01/28/2022      Lab Results   Component Value Date    LDL Calculated 115 01/28/2022      Lab Results   Component Value Date    Triglycerides 131 01/28/2022      Lab Results   Component Value Date    Chol/HDL Ratio 3.5  01/28/2022      Criteria used to determine recommendation include:    Hypertension    Invasive carcinoma of breast (HCC)    Liver cirrhosis secondary to NASH [facility])    Lung nodule    Major depressive disorder in full remission (HCC) 04/02/2020   Mood stable on venlafaxine 150 mg daily.     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 breast of female, estrogen receptor positive (HCC)    Migraine 08/29/2022   NAFLD (nonalcoholic fatty liver disease) 16/12/9602   Non-alcoholic cirrhosis (HCC)    Osteoarthritis of left knee 09/03/2020   Peritoneal hematoma 07/07/2019   Sprain of MCL (medial collateral  ligament) of knee 09/03/2020   Symptom associated with female genital organs 07/09/2010   Past Surgical History:  Procedure Laterality Date   ABDOMINAL HYSTERECTOMY     AXILLARY SENTINEL NODE BIOPSY Left 04/22/2023   Procedure: AXILLARY SENTINEL NODE BIOPSY;  Surgeon: Campbell Lerner, MD;  Location: [facility];  Service: General;  Laterality: Left;   BREAST BIOPSY Left 03/31/2023   [place] Core bx, coil clip - path pending   BREAST BIOPSY Left 03/31/2023   [place] LT BREAST BX W LOC DEV 1ST LESION IMG BX SPEC US GUIDE 03/31/2023 [facility]   BREAST BIOPSY Left 04/20/2023   [place] LT RADIO FREQUENCY TAG LOC US GUIDE 04/20/2023 [facility]   BREAST LUMPECTOMY WITH RADIOFREQUENCY TAG IDENTIFICATION Left 04/22/2023   Procedure: BREAST LUMPECTOMY WITH RADIOFREQUENCY TAG IDENTIFICATION;  Surgeon: Campbell Lerner, MD;  Location: [facility];  Service: General;  Laterality: Left;   CARDIAC CATHETERIZATION  03/10/2012   Negative   CATARACT EXTRACTION Bilateral    CESAREAN SECTION     CHOLECYSTECTOMY     RE-EXCISION OF BREAST LUMPECTOMY Left 05/20/2023   Procedure: EXCISION, LESION, BREAST, REPEAT;  Surgeon: Campbell Lerner, MD;  Location: [facility];  Service: General;  Laterality: Left;   SPLENECTOMY     TONSILLECTOMY     Allergies  Allergen Reactions   Penicillins Rash    Other reaction(s): unknown reaction   Codeine Other (See Comments)    Irritable, insomnia   Zoloft [Sertraline Hcl] Other (See Comments)    Paradoxical response         06/09/2023   11:55 AM 04/17/2023    2:51 PM 04/08/2023   11:38 AM  Depression screen PHQ 2/9  Decreased Interest 0 1 0  Down, Depressed, Hopeless 1 1 1   PHQ - 2 Score 1 2 1   Altered sleeping 2 2   Tired, decreased energy 2 1   Change in appetite 1 2   Feeling bad or failure about yourself  0 0   Trouble concentrating 1 0   Moving slowly or fidgety/restless 0 0   Suicidal thoughts 0 0   PHQ-9 Score 7 7   Difficult doing work/chores Not difficult at all  Not difficult at all        06/09/2023   12:02 PM 04/17/2023    2:51 PM 03/02/2023    9:46 AM  GAD 7 : Generalized Anxiety Score  Nervous, Anxious, on Edge 2 2 0  Control/stop worrying 1 1 1   Worry too much - different things 1 1 1   Trouble relaxing 1 1 1   Restless 1 1 0  Easily annoyed or irritable 1 2 1   Afraid - awful might happen 1 0 0  Total GAD 7 Score 8 8 4   Anxiety Difficulty Not difficult at all Not difficult at all Somewhat difficult  Review of Systems  Constitutional:  Negative for chills and fever.  Respiratory:  Negative for shortness of breath.   Cardiovascular:  Negative for chest pain.  Gastrointestinal:  Negative for abdominal pain, constipation, diarrhea, heartburn, nausea and vomiting.  Genitourinary:  Negative for dysuria, frequency and urgency.  Neurological:  Negative for dizziness and headaches.  Endo/Heme/Allergies:  Negative for polydipsia.  Psychiatric/Behavioral:  Negative for depression and suicidal ideas. The patient is not nervous/anxious.       Objective:     BP 118/64   Pulse 76   Temp 98.9 F (37.2 C) (Oral)   Ht 5\' 4"  (1.626 m)   Wt 160 lb (72.6 kg)   SpO2 98%   BMI 27.46 kg/m  BP Readings from Last 3 Encounters:  06/09/23 118/64  06/03/23 (!) 118/58  06/02/23 (!) 143/83   Wt Readings from Last 3 Encounters:  06/09/23 160 lb (72.6 kg)  06/03/23 161 lb 6.4 oz (73.2 kg)  06/02/23 159 lb 3.2 oz (72.2 kg)      Physical Exam Vitals and nursing note reviewed.  Constitutional:      Appearance: Normal appearance.  Cardiovascular:     Rate and Rhythm: Normal rate and regular rhythm.     Pulses: Normal pulses.     Heart sounds: Normal heart sounds.  Pulmonary:     Effort: Pulmonary effort is normal.     Breath sounds: Normal breath sounds.  Abdominal:     General: Abdomen is flat. Bowel sounds are normal.     Palpations: Abdomen is soft.  Skin:    General: Skin is warm.  Neurological:     Mental Status: She is alert and  oriented to person, place, and time.  Psychiatric:        Mood and Affect: Mood normal.        Behavior: Behavior normal.        Thought Content: Thought content normal.        Judgment: Judgment normal.      No results found for any visits on 06/09/23.     The 10-year ASCVD risk score (Arnett DK, et al., 2019) is: 26.4%    Assessment & Plan:  Change in facial mole Assessment & Plan: Patient hasn't heard anything regarding her referral.   Placed second referral.  Orders: -     Ambulatory referral to Dermatology  Gastroesophageal reflux disease, unspecified whether esophagitis present Assessment & Plan: Controlled.  Continue Omeprazole 20 mg once daily. Refill sent.  Orders: -     Omeprazole; Take 1 capsule (20 mg total) by mouth daily.  Dispense: 90 capsule; Refill: 1  Psoriasis of scalp Assessment & Plan: Improving.   Patient hasn't heard anything regarding her referral.   Placed second referral.  Orders: -     Ambulatory referral to Dermatology  Type 2 diabetes mellitus with diabetic neuropathy, unspecified whether long term insulin use (HCC) Assessment & Plan: Last A1c was on 03/12/23-7.2 Continue Metformin 1000 mg BID.   Foot exam completed today.  Pneumonia vaccine up to date.  Had her eye exam at [facility] eye last year. Request sent.   Follow up in 3 months.   Liver cirrhosis secondary to NASH [facility]) Assessment & Plan: Has seen GI.  Had the ultrasound done.   Waiting for her endoscopy and colonoscopy.  No concerns today.     Return in about 3 months (around 09/08/2023) for chronic management.[place]esto Charon, NP

## 2023-06-09 NOTE — Assessment & Plan Note (Signed)
 Improving.   Patient hasn't heard anything regarding her referral.   Placed second referral.

## 2023-06-09 NOTE — Patient Instructions (Addendum)
 Follow up in 3 months for chronic care management.   Continue medications.   Schedule bone density scan and colonoscopy.   It was a pleasure to see you today!

## 2023-06-10 ENCOUNTER — Ambulatory Visit
Admission: RE | Admit: 2023-06-10 | Discharge: 2023-06-10 | Disposition: A | Discharge status: Home / Self Care | Source: Ambulatory Visit | Attending: Radiation Oncology | Admitting: Radiation Oncology

## 2023-06-10 ENCOUNTER — Ambulatory Visit: Payer: Medicare HMO | Admitting: General Practice

## 2023-06-10 DIAGNOSIS — C50512 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 female breast: Secondary | ICD-10-CM | POA: Diagnosis present

## 2023-06-10 DIAGNOSIS — Z170 Estrogen receptor positive status [ER+]: Secondary | ICD-10-CM | POA: Insufficient documentation

## 2023-06-11 ENCOUNTER — Other Ambulatory Visit: Payer: Self-pay

## 2023-06-11 ENCOUNTER — Ambulatory Visit
Admission: RE | Admit: 2023-06-11 | Discharge: 2023-06-11 | Disposition: A | Discharge status: Home / Self Care | Source: Ambulatory Visit | Attending: Radiation Oncology | Admitting: Radiation Oncology

## 2023-06-11 DIAGNOSIS — C50512 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 female breast: Secondary | ICD-10-CM | POA: Diagnosis not present

## 2023-06-11 LAB — RAD ONC ARIA SESSION SUMMARY
Course Elapsed Days: 0
Plan Fractions Treated to Date: 1
Plan Prescribed Dose Per Fraction: 2.66 Gy
Plan Total Fractions Prescribed: 16
Plan Total Prescribed Dose: 42.56 Gy
Reference Point Dosage Given to Date: 2.66 Gy
Reference Point Session Dosage Given: 2.66 Gy
Session Number: 1

## 2023-06-12 ENCOUNTER — Ambulatory Visit
Admission: RE | Admit: 2023-06-12 | Discharge: 2023-06-12 | Disposition: A | Discharge status: Home / Self Care | Source: Ambulatory Visit | Attending: Radiation Oncology | Admitting: Radiation Oncology

## 2023-06-12 ENCOUNTER — Other Ambulatory Visit: Payer: Self-pay

## 2023-06-12 DIAGNOSIS — C50512 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 female breast: Secondary | ICD-10-CM | POA: Diagnosis not present

## 2023-06-12 LAB — RAD ONC ARIA SESSION SUMMARY
Course Elapsed Days: 1
Plan Fractions Treated to Date: 2
Plan Prescribed Dose Per Fraction: 2.66 Gy
Plan Total Fractions Prescribed: 16
Plan Total Prescribed Dose: 42.56 Gy
Reference Point Dosage Given to Date: 5.32 Gy
Reference Point Session Dosage Given: 2.66 Gy
Session Number: 2

## 2023-06-15 ENCOUNTER — Other Ambulatory Visit: Payer: Self-pay

## 2023-06-15 ENCOUNTER — Encounter: Payer: Self-pay | Admitting: Student in an Organized Health Care Education/Training Program

## 2023-06-15 ENCOUNTER — Ambulatory Visit
Admission: RE | Admit: 2023-06-15 | Discharge: 2023-06-15 | Disposition: A | Discharge status: Home / Self Care | Source: Ambulatory Visit | Attending: Radiation Oncology | Admitting: Radiation Oncology

## 2023-06-15 DIAGNOSIS — C50512 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 female breast: Secondary | ICD-10-CM | POA: Diagnosis not present

## 2023-06-15 LAB — RAD ONC ARIA SESSION SUMMARY
Course Elapsed Days: 4
Plan Fractions Treated to Date: 3
Plan Prescribed Dose Per Fraction: 2.66 Gy
Plan Total Fractions Prescribed: 16
Plan Total Prescribed Dose: 42.56 Gy
Reference Point Dosage Given to Date: 7.98 Gy
Reference Point Session Dosage Given: 2.66 Gy
Session Number: 3

## 2023-06-16 ENCOUNTER — Other Ambulatory Visit: Payer: Self-pay

## 2023-06-16 ENCOUNTER — Ambulatory Visit
Admission: RE | Admit: 2023-06-16 | Discharge: 2023-06-16 | Disposition: A | Discharge status: Home / Self Care | Source: Ambulatory Visit | Attending: Radiation Oncology | Admitting: Radiation Oncology

## 2023-06-16 DIAGNOSIS — C50512 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 female breast: Secondary | ICD-10-CM | POA: Diagnosis not present

## 2023-06-16 LAB — RAD ONC ARIA SESSION SUMMARY
Course Elapsed Days: 5
Plan Fractions Treated to Date: 4
Plan Prescribed Dose Per Fraction: 2.66 Gy
Plan Total Fractions Prescribed: 16
Plan Total Prescribed Dose: 42.56 Gy
Reference Point Dosage Given to Date: 10.64 Gy
Reference Point Session Dosage Given: 2.66 Gy
Session Number: 4

## 2023-06-17 ENCOUNTER — Ambulatory Visit
Admission: RE | Admit: 2023-06-17 | Discharge: 2023-06-17 | Disposition: A | Discharge status: Home / Self Care | Source: Ambulatory Visit | Attending: Radiation Oncology | Admitting: Radiation Oncology

## 2023-06-17 ENCOUNTER — Inpatient Hospital Stay

## 2023-06-17 ENCOUNTER — Other Ambulatory Visit: Payer: Self-pay

## 2023-06-17 DIAGNOSIS — Z170 Estrogen receptor positive status [ER+]: Secondary | ICD-10-CM | POA: Insufficient documentation

## 2023-06-17 DIAGNOSIS — C50512 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 female breast: Secondary | ICD-10-CM | POA: Insufficient documentation

## 2023-06-17 LAB — RAD ONC ARIA SESSION SUMMARY
Course Elapsed Days: 6
Plan Fractions Treated to Date: 5
Plan Prescribed Dose Per Fraction: 2.66 Gy
Plan Total Fractions Prescribed: 16
Plan Total Prescribed Dose: 42.56 Gy
Reference Point Dosage Given to Date: 13.3 Gy
Reference Point Session Dosage Given: 2.66 Gy
Session Number: 5

## 2023-06-18 ENCOUNTER — Other Ambulatory Visit: Payer: Self-pay

## 2023-06-18 ENCOUNTER — Ambulatory Visit
Admission: RE | Admit: 2023-06-18 | Discharge: 2023-06-18 | Disposition: A | Discharge status: Home / Self Care | Source: Ambulatory Visit | Attending: Radiation Oncology | Admitting: Radiation Oncology

## 2023-06-18 DIAGNOSIS — C50512 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 female breast: Secondary | ICD-10-CM | POA: Diagnosis not present

## 2023-06-18 LAB — RAD ONC ARIA SESSION SUMMARY
Course Elapsed Days: 7
Plan Fractions Treated to Date: 6
Plan Prescribed Dose Per Fraction: 2.66 Gy
Plan Total Fractions Prescribed: 16
Plan Total Prescribed Dose: 42.56 Gy
Reference Point Dosage Given to Date: 15.96 Gy
Reference Point Session Dosage Given: 2.66 Gy
Session Number: 6

## 2023-06-19 ENCOUNTER — Other Ambulatory Visit: Payer: Self-pay

## 2023-06-19 ENCOUNTER — Ambulatory Visit
Admission: RE | Admit: 2023-06-19 | Discharge: 2023-06-19 | Disposition: A | Discharge status: Home / Self Care | Source: Ambulatory Visit | Attending: Radiation Oncology | Admitting: Radiation Oncology

## 2023-06-19 ENCOUNTER — Encounter: Payer: Self-pay | Admitting: General Practice

## 2023-06-19 DIAGNOSIS — C50512 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 female breast: Secondary | ICD-10-CM | POA: Diagnosis not present

## 2023-06-19 LAB — RAD ONC ARIA SESSION SUMMARY
Course Elapsed Days: 8
Plan Fractions Treated to Date: 7
Plan Prescribed Dose Per Fraction: 2.66 Gy
Plan Total Fractions Prescribed: 16
Plan Total Prescribed Dose: 42.56 Gy
Reference Point Dosage Given to Date: 18.62 Gy
Reference Point Session Dosage Given: 2.66 Gy
Session Number: 7

## 2023-06-19 NOTE — Telephone Encounter (Signed)
 Please send dermatology referral to another location that is accepting patients.

## 2023-06-22 ENCOUNTER — Other Ambulatory Visit: Payer: Self-pay | Admitting: *Deleted

## 2023-06-22 ENCOUNTER — Other Ambulatory Visit: Payer: Self-pay | Admitting: General Practice

## 2023-06-22 ENCOUNTER — Ambulatory Visit
Admission: RE | Admit: 2023-06-22 | Discharge: 2023-06-22 | Disposition: A | Discharge status: Home / Self Care | Source: Ambulatory Visit | Attending: Radiation Oncology | Admitting: Radiation Oncology

## 2023-06-22 ENCOUNTER — Other Ambulatory Visit: Payer: Self-pay

## 2023-06-22 ENCOUNTER — Inpatient Hospital Stay

## 2023-06-22 DIAGNOSIS — E1140 Type 2 diabetes mellitus with diabetic neuropathy, unspecified: Secondary | ICD-10-CM

## 2023-06-22 DIAGNOSIS — C50512 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 female breast: Secondary | ICD-10-CM | POA: Diagnosis not present

## 2023-06-22 LAB — CBC (CANCER CENTER ONLY)
HCT: 39.5 % (ref 36.0–46.0)
Hemoglobin: 12.8 g/dL (ref 12.0–15.0)
MCH: 29.5 pg (ref 26.0–34.0)
MCHC: 32.4 g/dL (ref 30.0–36.0)
MCV: 91 fL (ref 80.0–100.0)
Platelet Count: 391 10*3/uL (ref 150–400)
RBC: 4.34 MIL/uL (ref 3.87–5.11)
RDW: 13.9 % (ref 11.5–15.5)
WBC Count: 8.6 10*3/uL (ref 4.0–10.5)
nRBC: 0 % (ref 0.0–0.2)

## 2023-06-22 LAB — RAD ONC ARIA SESSION SUMMARY
Course Elapsed Days: 11
Plan Fractions Treated to Date: 8
Plan Prescribed Dose Per Fraction: 2.66 Gy
Plan Total Fractions Prescribed: 16
Plan Total Prescribed Dose: 42.56 Gy
Reference Point Dosage Given to Date: 21.28 Gy
Reference Point Session Dosage Given: 2.66 Gy
Session Number: 8

## 2023-06-22 NOTE — Telephone Encounter (Signed)
 Refill request for gabapentin (NEURONTIN) 100 MG capsule   LOV - 06/09/23 Next OV - 09/08/23 Last refilled - 04/17/23 #120/0

## 2023-06-23 ENCOUNTER — Other Ambulatory Visit: Payer: Self-pay

## 2023-06-23 ENCOUNTER — Inpatient Hospital Stay
Admission: RE | Admit: 2023-06-23 | Discharge: 2023-06-23 | Disposition: A | Discharge status: Home / Self Care | Payer: Self-pay | Source: Ambulatory Visit | Attending: Student in an Organized Health Care Education/Training Program

## 2023-06-23 ENCOUNTER — Ambulatory Visit
Admission: RE | Admit: 2023-06-23 | Discharge: 2023-06-23 | Disposition: A | Discharge status: Home / Self Care | Source: Ambulatory Visit | Attending: Radiation Oncology | Admitting: Radiation Oncology

## 2023-06-23 ENCOUNTER — Inpatient Hospital Stay
Admission: RE | Admit: 2023-06-23 | Discharge: 2023-06-23 | Disposition: A | Discharge status: Home / Self Care | Payer: Self-pay | Source: Ambulatory Visit | Attending: Student in an Organized Health Care Education/Training Program | Admitting: Student in an Organized Health Care Education/Training Program

## 2023-06-23 DIAGNOSIS — C50512 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 female breast: Secondary | ICD-10-CM | POA: Diagnosis not present

## 2023-06-23 DIAGNOSIS — Z9289 Personal history of other medical treatment: Secondary | ICD-10-CM

## 2023-06-23 LAB — RAD ONC ARIA SESSION SUMMARY
Course Elapsed Days: 12
Plan Fractions Treated to Date: 9
Plan Prescribed Dose Per Fraction: 2.66 Gy
Plan Total Fractions Prescribed: 16
Plan Total Prescribed Dose: 42.56 Gy
Reference Point Dosage Given to Date: 23.94 Gy
Reference Point Session Dosage Given: 2.66 Gy
Session Number: 9

## 2023-06-23 MED ORDER — GABAPENTIN 100 MG PO CAPS
100.0000 mg | ORAL_CAPSULE | Freq: Two times a day (BID) | ORAL | 0 refills | Status: AC
Start: 2023-06-23 — End: ?

## 2023-06-23 NOTE — Telephone Encounter (Signed)
 Created new powershare request 06/23/23 for STAT image release

## 2023-06-23 NOTE — Telephone Encounter (Signed)
 Images have been uploaded into her chart.

## 2023-06-23 NOTE — Addendum Note (Signed)
 Addended by: Pepper Boyer on: 06/23/2023 04:24 PM   Modules accepted: Orders

## 2023-06-24 ENCOUNTER — Ambulatory Visit
Admission: RE | Admit: 2023-06-24 | Discharge: 2023-06-24 | Disposition: A | Discharge status: Home / Self Care | Source: Ambulatory Visit | Attending: Radiation Oncology | Admitting: Radiation Oncology

## 2023-06-24 ENCOUNTER — Other Ambulatory Visit: Payer: Self-pay

## 2023-06-24 DIAGNOSIS — C50512 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 female breast: Secondary | ICD-10-CM | POA: Diagnosis not present

## 2023-06-24 LAB — RAD ONC ARIA SESSION SUMMARY
Course Elapsed Days: 13
Plan Fractions Treated to Date: 10
Plan Prescribed Dose Per Fraction: 2.66 Gy
Plan Total Fractions Prescribed: 16
Plan Total Prescribed Dose: 42.56 Gy
Reference Point Dosage Given to Date: 26.6 Gy
Reference Point Session Dosage Given: 2.66 Gy
Session Number: 10

## 2023-06-25 ENCOUNTER — Ambulatory Visit
Admission: RE | Admit: 2023-06-25 | Discharge: 2023-06-25 | Disposition: A | Discharge status: Home / Self Care | Source: Ambulatory Visit | Attending: Radiation Oncology | Admitting: Radiation Oncology

## 2023-06-25 ENCOUNTER — Other Ambulatory Visit: Payer: Self-pay

## 2023-06-25 DIAGNOSIS — C50512 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 female breast: Secondary | ICD-10-CM | POA: Diagnosis not present

## 2023-06-25 LAB — RAD ONC ARIA SESSION SUMMARY
Course Elapsed Days: 14
Plan Fractions Treated to Date: 11
Plan Prescribed Dose Per Fraction: 2.66 Gy
Plan Total Fractions Prescribed: 16
Plan Total Prescribed Dose: 42.56 Gy
Reference Point Dosage Given to Date: 29.26 Gy
Reference Point Session Dosage Given: 2.66 Gy
Session Number: 11

## 2023-06-26 ENCOUNTER — Other Ambulatory Visit: Payer: Self-pay

## 2023-06-26 ENCOUNTER — Ambulatory Visit
Admission: RE | Admit: 2023-06-26 | Discharge: 2023-06-26 | Disposition: A | Discharge status: Home / Self Care | Source: Ambulatory Visit | Attending: Radiation Oncology | Admitting: Radiation Oncology

## 2023-06-26 DIAGNOSIS — C50512 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 female breast: Secondary | ICD-10-CM | POA: Diagnosis not present

## 2023-06-26 LAB — RAD ONC ARIA SESSION SUMMARY
Course Elapsed Days: 15
Plan Fractions Treated to Date: 12
Plan Prescribed Dose Per Fraction: 2.66 Gy
Plan Total Fractions Prescribed: 16
Plan Total Prescribed Dose: 42.56 Gy
Reference Point Dosage Given to Date: 31.92 Gy
Reference Point Session Dosage Given: 2.66 Gy
Session Number: 12

## 2023-06-29 ENCOUNTER — Other Ambulatory Visit: Payer: Self-pay

## 2023-06-29 ENCOUNTER — Ambulatory Visit
Admission: RE | Admit: 2023-06-29 | Discharge: 2023-06-29 | Disposition: A | Discharge status: Home / Self Care | Source: Ambulatory Visit | Attending: Radiation Oncology | Admitting: Radiation Oncology

## 2023-06-29 DIAGNOSIS — C50512 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 female breast: Secondary | ICD-10-CM | POA: Diagnosis not present

## 2023-06-29 LAB — RAD ONC ARIA SESSION SUMMARY
Course Elapsed Days: 18
Plan Fractions Treated to Date: 13
Plan Prescribed Dose Per Fraction: 2.66 Gy
Plan Total Fractions Prescribed: 16
Plan Total Prescribed Dose: 42.56 Gy
Reference Point Dosage Given to Date: 34.58 Gy
Reference Point Session Dosage Given: 2.66 Gy
Session Number: 13

## 2023-06-30 ENCOUNTER — Ambulatory Visit
Admission: RE | Admit: 2023-06-30 | Discharge: 2023-06-30 | Disposition: A | Discharge status: Home / Self Care | Source: Ambulatory Visit | Attending: Radiation Oncology | Admitting: Radiation Oncology

## 2023-06-30 ENCOUNTER — Other Ambulatory Visit: Payer: Self-pay

## 2023-06-30 DIAGNOSIS — C50512 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 female breast: Secondary | ICD-10-CM | POA: Diagnosis not present

## 2023-06-30 LAB — RAD ONC ARIA SESSION SUMMARY
Course Elapsed Days: 19
Plan Fractions Treated to Date: 14
Plan Prescribed Dose Per Fraction: 2.66 Gy
Plan Total Fractions Prescribed: 16
Plan Total Prescribed Dose: 42.56 Gy
Reference Point Dosage Given to Date: 37.24 Gy
Reference Point Session Dosage Given: 2.66 Gy
Session Number: 14

## 2023-07-01 ENCOUNTER — Other Ambulatory Visit: Payer: Self-pay

## 2023-07-01 ENCOUNTER — Inpatient Hospital Stay

## 2023-07-01 ENCOUNTER — Ambulatory Visit
Admission: RE | Admit: 2023-07-01 | Discharge: 2023-07-01 | Disposition: A | Discharge status: Home / Self Care | Source: Ambulatory Visit | Attending: Radiation Oncology | Admitting: Radiation Oncology

## 2023-07-01 DIAGNOSIS — C50512 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 female breast: Secondary | ICD-10-CM | POA: Diagnosis not present

## 2023-07-01 LAB — CBC (CANCER CENTER ONLY)
HCT: 39.3 % (ref 36.0–46.0)
Hemoglobin: 12.5 g/dL (ref 12.0–15.0)
MCH: 28.8 pg (ref 26.0–34.0)
MCHC: 31.8 g/dL (ref 30.0–36.0)
MCV: 90.6 fL (ref 80.0–100.0)
Platelet Count: 369 10*3/uL (ref 150–400)
RBC: 4.34 MIL/uL (ref 3.87–5.11)
RDW: 14.1 % (ref 11.5–15.5)
WBC Count: 8.9 10*3/uL (ref 4.0–10.5)
nRBC: 0 % (ref 0.0–0.2)

## 2023-07-01 LAB — RAD ONC ARIA SESSION SUMMARY
Course Elapsed Days: 20
Plan Fractions Treated to Date: 15
Plan Prescribed Dose Per Fraction: 2.66 Gy
Plan Total Fractions Prescribed: 16
Plan Total Prescribed Dose: 42.56 Gy
Reference Point Dosage Given to Date: 39.9 Gy
Reference Point Session Dosage Given: 2.66 Gy
Session Number: 15

## 2023-07-02 ENCOUNTER — Ambulatory Visit
Admission: RE | Admit: 2023-07-02 | Discharge: 2023-07-02 | Disposition: A | Discharge status: Home / Self Care | Source: Ambulatory Visit | Attending: Radiation Oncology | Admitting: Radiation Oncology

## 2023-07-02 ENCOUNTER — Other Ambulatory Visit: Payer: Self-pay

## 2023-07-02 DIAGNOSIS — C50512 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 female breast: Secondary | ICD-10-CM | POA: Diagnosis not present

## 2023-07-02 LAB — RAD ONC ARIA SESSION SUMMARY
Course Elapsed Days: 21
Plan Fractions Treated to Date: 16
Plan Prescribed Dose Per Fraction: 2.66 Gy
Plan Total Fractions Prescribed: 16
Plan Total Prescribed Dose: 42.56 Gy
Reference Point Dosage Given to Date: 42.56 Gy
Reference Point Session Dosage Given: 2.66 Gy
Session Number: 16

## 2023-07-03 ENCOUNTER — Other Ambulatory Visit: Payer: Self-pay

## 2023-07-03 ENCOUNTER — Ambulatory Visit
Admission: RE | Admit: 2023-07-03 | Discharge: 2023-07-03 | Disposition: A | Discharge status: Home / Self Care | Source: Ambulatory Visit | Attending: Radiation Oncology | Admitting: Radiation Oncology

## 2023-07-03 DIAGNOSIS — C50512 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 female breast: Secondary | ICD-10-CM | POA: Diagnosis not present

## 2023-07-03 LAB — RAD ONC ARIA SESSION SUMMARY
Course Elapsed Days: 22
Plan Fractions Treated to Date: 1
Plan Prescribed Dose Per Fraction: 2 Gy
Plan Total Fractions Prescribed: 5
Plan Total Prescribed Dose: 10 Gy
Reference Point Dosage Given to Date: 2 Gy
Reference Point Session Dosage Given: 2 Gy
Session Number: 17

## 2023-07-06 ENCOUNTER — Other Ambulatory Visit: Payer: Self-pay

## 2023-07-06 ENCOUNTER — Ambulatory Visit
Admission: RE | Admit: 2023-07-06 | Discharge: 2023-07-06 | Disposition: A | Discharge status: Home / Self Care | Source: Ambulatory Visit | Attending: Radiation Oncology | Admitting: Radiation Oncology

## 2023-07-06 DIAGNOSIS — C50512 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 female breast: Secondary | ICD-10-CM | POA: Diagnosis not present

## 2023-07-06 LAB — RAD ONC ARIA SESSION SUMMARY
Course Elapsed Days: 25
Plan Fractions Treated to Date: 2
Plan Prescribed Dose Per Fraction: 2 Gy
Plan Total Fractions Prescribed: 5
Plan Total Prescribed Dose: 10 Gy
Reference Point Dosage Given to Date: 4 Gy
Reference Point Session Dosage Given: 2 Gy
Session Number: 18

## 2023-07-07 ENCOUNTER — Other Ambulatory Visit: Payer: Self-pay

## 2023-07-07 ENCOUNTER — Ambulatory Visit
Admission: RE | Admit: 2023-07-07 | Discharge: 2023-07-07 | Disposition: A | Discharge status: Home / Self Care | Source: Ambulatory Visit | Attending: Radiation Oncology | Admitting: Radiation Oncology

## 2023-07-07 DIAGNOSIS — C50512 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 female breast: Secondary | ICD-10-CM | POA: Diagnosis not present

## 2023-07-07 LAB — RAD ONC ARIA SESSION SUMMARY
Course Elapsed Days: 26
Plan Fractions Treated to Date: 3
Plan Prescribed Dose Per Fraction: 2 Gy
Plan Total Fractions Prescribed: 5
Plan Total Prescribed Dose: 10 Gy
Reference Point Dosage Given to Date: 6 Gy
Reference Point Session Dosage Given: 2 Gy
Session Number: 19

## 2023-07-08 ENCOUNTER — Ambulatory Visit
Admission: RE | Admit: 2023-07-08 | Discharge: 2023-07-08 | Disposition: A | Discharge status: Home / Self Care | Source: Ambulatory Visit | Attending: Radiation Oncology | Admitting: Radiation Oncology

## 2023-07-08 ENCOUNTER — Other Ambulatory Visit: Payer: Self-pay

## 2023-07-08 DIAGNOSIS — C50512 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 female breast: Secondary | ICD-10-CM | POA: Diagnosis not present

## 2023-07-08 LAB — RAD ONC ARIA SESSION SUMMARY
Course Elapsed Days: 27
Plan Fractions Treated to Date: 4
Plan Prescribed Dose Per Fraction: 2 Gy
Plan Total Fractions Prescribed: 5
Plan Total Prescribed Dose: 10 Gy
Reference Point Dosage Given to Date: 8 Gy
Reference Point Session Dosage Given: 2 Gy
Session Number: 20

## 2023-07-09 ENCOUNTER — Encounter: Payer: Self-pay | Admitting: *Deleted

## 2023-07-09 ENCOUNTER — Ambulatory Visit
Admission: RE | Admit: 2023-07-09 | Discharge: 2023-07-09 | Disposition: A | Discharge status: Home / Self Care | Source: Ambulatory Visit | Attending: Radiation Oncology | Admitting: Radiation Oncology

## 2023-07-09 ENCOUNTER — Other Ambulatory Visit: Payer: Self-pay

## 2023-07-09 DIAGNOSIS — C50512 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 female breast: Secondary | ICD-10-CM | POA: Insufficient documentation

## 2023-07-09 DIAGNOSIS — Z170 Estrogen receptor positive status [ER+]: Secondary | ICD-10-CM | POA: Insufficient documentation

## 2023-07-09 LAB — RAD ONC ARIA SESSION SUMMARY
Course Elapsed Days: 28
Plan Fractions Treated to Date: 5
Plan Prescribed Dose Per Fraction: 2 Gy
Plan Total Fractions Prescribed: 5
Plan Total Prescribed Dose: 10 Gy
Reference Point Dosage Given to Date: 10 Gy
Reference Point Session Dosage Given: 2 Gy
Session Number: 21

## 2023-07-10 ENCOUNTER — Other Ambulatory Visit

## 2023-07-10 NOTE — Radiation Completion Notes (Signed)
 Patient Name: Bianca Rogers, SHECKLER MRN: [id number] Date of Birth: 06-17-1950 Referring Physician: Flynn Hylan, M.D. Date of Service: 2023-07-10 Radiation Oncologist: Glenis Langdon, M.D. 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  There was a 0.8 cm suspicious mass in the lower outer quadrant left breast.  No abnormal appearing left axillary lymph nodes were noted.  She underwent biopsy which was positive for invasive mammary carcinoma.  MRI of her breast showed 0.8 cm malignancy within the posterior left lower breast no other suspicious abnormalities within the breast no abnormal appearing lymph nodes were noted.  She is also had a CT scan of her chest showing some mild tubular bronchiectasis in the lung bases.  She underwent a wide local excision and sentinel node biopsy for grade 3 ER/PR positive invasive mammary carcinoma measuring 1 cm.  Posterior margin was positive for DCIS.  6 sentinel lymph nodes were examined all negative for malignancy.  She is scheduled for reexcision after tumor board recommended based on the positive DCIS margin.  She is seen today and doing well has some slight breast tenderness.  Otherwise specifically denies bone pain or cough.  She has had an Oncotype DX ordered which is pending.      ==========DELIVERED PLANS==========  First Treatment Date: 2023-06-11 Last Treatment Date: 2023-07-09   Plan Name: Breast_L Site: Breast, Left Technique: 3D Mode: Photon Dose Per Fraction: 2.66 Gy Prescribed Dose (Delivered / Prescribed): 42.56 Gy / 42.56  Gy Prescribed Fxs (Delivered / Prescribed): 16 / 16   Plan Name: Breast_L_Bst Site: Breast, Left Technique: 3D Mode: Photon Dose Per Fraction: 2 Gy Prescribed Dose (Delivered / Prescribed): 10 Gy / 10 Gy Prescribed Fxs (Delivered / Prescribed): 5 / 5     ==========ON TREATMENT VISIT DATES========== 2023-06-16, 2023-06-23, 2023-06-30, 2023-07-07     ==========UPCOMING VISITS==========       ==========APPENDIX - ON TREATMENT VISIT NOTES==========   See weekly On Treatment Notes in Epic for details in the Media tab (listed as Progress notes on the On Treatment Visit Dates listed above).

## 2023-07-11 ENCOUNTER — Encounter: Payer: Self-pay | Admitting: Student in an Organized Health Care Education/Training Program

## 2023-07-23 ENCOUNTER — Inpatient Hospital Stay: Admitting: *Deleted

## 2023-07-23 ENCOUNTER — Inpatient Hospital Stay: Attending: Oncology | Admitting: Oncology

## 2023-07-23 ENCOUNTER — Encounter: Payer: Self-pay | Admitting: *Deleted

## 2023-07-23 ENCOUNTER — Encounter: Payer: Self-pay | Admitting: Oncology

## 2023-07-23 VITALS — BP 118/67 | HR 78 | Temp 96.2°F | Resp 18 | Wt 157.8 lb

## 2023-07-23 DIAGNOSIS — M8580 Other specified disorders of bone density and structure, unspecified site: Secondary | ICD-10-CM | POA: Diagnosis not present

## 2023-07-23 DIAGNOSIS — C50512 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 female breast: Secondary | ICD-10-CM | POA: Diagnosis present

## 2023-07-23 DIAGNOSIS — Z9189 Other specified personal risk factors, not elsewhere classified: Secondary | ICD-10-CM

## 2023-07-23 DIAGNOSIS — Z170 Estrogen receptor positive status [ER+]: Secondary | ICD-10-CM | POA: Diagnosis not present

## 2023-07-23 DIAGNOSIS — Z803 Family history of malignant neoplasm of breast: Secondary | ICD-10-CM | POA: Insufficient documentation

## 2023-07-23 DIAGNOSIS — Z8049 Family history of malignant neoplasm of other genital organs: Secondary | ICD-10-CM | POA: Insufficient documentation

## 2023-07-23 DIAGNOSIS — Z87891 Personal history of nicotine dependence: Secondary | ICD-10-CM | POA: Diagnosis not present

## 2023-07-23 DIAGNOSIS — C50919 Malignant neoplasm of unspecified site of unspecified female breast: Secondary | ICD-10-CM | POA: Diagnosis not present

## 2023-07-23 MED ORDER — ANASTROZOLE 1 MG PO TABS: 1.0000 mg | ORAL_TABLET | Freq: Every day | ORAL | 3 refills | Status: DC

## 2023-07-23 NOTE — Assessment & Plan Note (Addendum)
 Will obtain baseline bone density Recommend calcium and vitamin D supplementation.

## 2023-07-23 NOTE — Assessment & Plan Note (Addendum)
 Left breast invasive carcinoma G3, with high-grade DCIS s/p lumpectomy SLNB, positive margin, s/p re-excision. pT1b pN0 ER100%, PR 95% HER2 (0) Oncotype Dx score 21, will not offer adjuvant chemotherapy.  S/p adjuvant radiation  Recommend adjuvant endocrine therapy.  Rationale of using aromatase inhibitor -Arimidex  discussed with patient.  Side effects of Arimidex including but not limited to hot flash, muscle and joint pain, fatigue, mood swing, vaginal dryness/inching, loss of bone mineral density,hair thinning, heart problem, etc were discussed with patient.  Patient voices understanding and willing to proceed.

## 2023-07-23 NOTE — Progress Notes (Signed)
 Hematology/Oncology Progress note [phone] [phone]        REFERRING PROVIDER: Jolanda Nation, NP    CHIEF COMPLAINTS/PURPOSE OF CONSULTATION:  Left breast invasive carcinoma  ASSESSMENT & PLAN:   Cancer Staging  Invasive carcinoma of breast (HCC) Staging form: Breast, AJCC 8th Edition - Clinical stage from 04/08/2023: Stage IA (cT1b, cN0, cM0, G3, ER+, PR+, HER2-) - Signed by Timmy Forbes, MD on 04/08/2023 - Pathologic stage from 04/22/2023: Stage IA (pT1b, pN0, cM0, G3, ER+, PR+, HER2-, Oncotype DX score: 21) - Signed by Timmy Forbes, MD on 05/27/2023   Invasive carcinoma of breast (HCC) Left breast invasive carcinoma G3, with high-grade DCIS s/p lumpectomy SLNB, positive margin, s/p re-excision. pT1b pN0 ER100%, PR 95% HER2 (0) Oncotype Dx score 21, will not offer adjuvant chemotherapy.  S/p adjuvant radiation  Recommend adjuvant endocrine therapy.  Rationale of using aromatase inhibitor -Arimidex  discussed with patient.  Side effects of Arimidex including but not limited to hot flash, muscle and joint pain, fatigue, mood swing, vaginal dryness/inching, loss of bone mineral density,hair thinning, heart problem, etc were discussed with patient.  Patient voices understanding and willing to proceed.     Osteopenia Will obtain baseline bone density Recommend calcium and vitamin D supplementation.    Orders Placed This Encounter  Procedures   CMP (Cancer Center only)    Standing Status:   Future    Expected Date:   10/23/2023    Expiration Date:   07/22/2024   CBC with Differential (Cancer Center Only)    Standing Status:   Future    Expected Date:   10/23/2023    Expiration Date:   07/22/2024   Follow-up  3 months.  All questions were answered. The patient knows to call the clinic with any problems, questions or concerns.  Timmy Forbes, MD, PhD [facility] Hematology Oncology 07/23/2023    HISTORY OF PRESENTING ILLNESS:  Bianca Rogers 73 y.o. female  presents to establish care for left breast invasive carcinoma I have reviewed her chart and materials related to her cancer extensively and collaborated history with the patient. Summary of oncologic history is as follows: Oncology History  Invasive carcinoma of breast (HCC)  03/26/2023 Mammogram   Diagnostic unilateral left breast mammogram showed 1. 0.8 cm suspicious mass within the LOWER OUTER LEFT breast, likely representing the enlarging mammographic finding. Tissue sampling is recommended. No abnormal appearing LEFT axillary lymph nodes.   04/08/2023 Initial Diagnosis   Invasive carcinoma of breast [facility])  Patient presented for left breast diagnostic mammogram for follow-up of left breast asymmetry from outside facility.  She denies any breast concerns.  Left breast 5:00 4 cm from nipple needle core biopsy showed - INVASIVE MAMMARY CARCINOMA, NO SPECIAL TYPE.       - TUBULE FORMATION: SCORE 3       - NUCLEAR PLEOMORPHISM: SCORE 3       - MITOTIC COUNT: SCORE 2       - TOTAL SCORE: 8       - OVERALL GRADE: 3       - LYMPHOVASCULAR INVASION: NOT IDENTIFIED       - CANCER LENGTH: 8 MM       - CALCIFICATIONS: NOT IDENTIFIED       - DUCTAL CARCINOMA IN SITU: PRESENT, HIGH-GRADE   ER 100%, PR 95%, HER2 negative [0]   Menarche at age of 7 or 72. First live birth at age of 33 OCP use: Less than 5 years of  use. History of hysterectomy: Yes with removal of 1 ovary. Menopausal status: Postmenopausal History of HRT use: Denies History of chest radiation: Denies Number of previous breast biopsies: Denies Family history positive for multiple family members with breast cancer.     04/08/2023 Cancer Staging   Staging form: Breast, AJCC 8th Edition - Clinical stage from 04/08/2023: Stage IA (cT1b, cN0, cM0, G3, ER+, PR+, HER2-) - Signed by Timmy Forbes, MD on 04/08/2023 Stage prefix: Initial diagnosis Histologic grading system: 3 grade system    Genetic Testing   Negative  CancerNext-Expanded +RNAinsight panel. 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 The CancerNext-Expanded gene panel offered by [facility] and includes sequencing, rearrangement, and RNA analysis for the following 76 genes: AIP, ALK, APC, ATM, AXIN2, BAP1, BARD1, BMPR1A, BRCA1, BRCA2, BRIP1, CDC73, CDH1, CDK4, CDKN1B, CDKN2A, CEBPA, CHEK2, CTNNA1, DDX41, DICER1, ETV6, FH, FLCN, GATA2, LZTR1, MAX, MBD4, MEN1, MET, MLH1, MSH2, MSH3, MSH6, MUTYH, NF1, NF2, NTHL1, PALB2, PHOX2B, PMS2, POT1, PRKAR1A, PTCH1, PTEN, RAD51C, RAD51D, RB1, RET, RUNX1, SDHA, SDHAF2, SDHB, SDHC, SDHD, SMAD4, SMARCA4, SMARCB1, SMARCE1, STK11, SUFU, TMEM127, TP53, TSC1, TSC2, VHL, and WT1 (sequencing and deletion/duplication); EGFR, HOXB13, KIT, MITF, PDGFRA, POLD1, and POLE (sequencing only); EPCAM and GREM1 (deletion/duplication only). Report date 05/07/23.    04/13/2023 Imaging   MRI breast bilateral w wo contrast   1. Biopsy-proven 0.8 cm malignancy within the posterior LOWER LEFT breast. No other suspicious abnormalities noted within either breast although motion artifact slightly decreases sensitivity. 2. No abnormal appearing lymph nodes.      04/22/2023 Surgery   Patient underwent left breast lumpectomy and sentinel lymph node biopsy. Pathology showed 1. Lymph node, sentinel, biopsy, #1 :      ONE LYMPH NODE, NEGATIVE FOR METASTATIC CARCINOMA (0/1).       2. Breast, lumpectomy, Left inferior tissue :      INVASIVE DUCTAL CARCINOMA, 1.0 CM, GRADE 3      DUCTAL CARCINOMA IN SITU:  SOLID AND CRIBRIFORM TYPES, INTERMEDIATE TO HIGH      GRADE      MARGINS, INVASIVE:  NEGATIVE      CLOSEST, INVASIVE:  1.5 MM FROM INFERIOR MARGIN      MARGINS, DCIS:  NEGATIVE      CLOSEST, DCIS:  1.5 MM FROM LATERAL MARGIN (SEE PART 4 FOR FINAL POSTERIOR      MARGIN)      LYMPHOVASCULAR INVASION:  NOT IDENTIFIED      PROGNOSTIC MARKERS:      ER: 100%, POSITIVE, STRONG STAINING INTENSITY      PR: 95%, POSITIVE,  MODERATE STAINING INTENSITY       HER2: NEGATIVE, 0      OTHER:  NONE      SEE ONCOLOGY TABLE       3. Lymph node, sentinel, biopsy, #2,3.4.5,6,7 :      FIVE LYMPH NODES, NEGATIVE FOR METASTATIC CARCINOMA (0/5).       4. Breast, lumpectomy, Left, additional superior and postreior margins :      DUCTAL CARCINOMA IN SITU, HIGH GRADE.      DCIS INVOLVES INKED BLACK MARGIN / NEW POSTERIOR MARGIN.      NEW SUPERIOR MARGIN IS NEGATIVE FOR CARCINOMA. Diagnosis Note : INVASIVE CARCINOMA OF THE BREAST:  Resection      Procedure: Lumpectomy      Specimen Laterality: Left      Histologic Type: Invasive ductal carcinoma      Histologic Grade:      Glandular (Acinar)/Tubular Differentiation: 3  Nuclear Pleomorphism: 3      Mitotic Rate: 2      Overall Grade: 3      Tumor Size: 1.0 cm      Ductal Carcinoma In Situ: Solid and cribriform types, intermediate to high grade      Lymphatic and/or Vascular Invasion:  Not identified      Treatment Effect in the Breast: No known presurgical therapy      Margins: All margins negative for invasive carcinoma      Distance from Closest Margin (mm): 1.5 mm from inferior margin      Specify Closest Margin (required only if <33mm): 1.5 mm from inferior margin      DCIS Margins: Involved by DCIS      Distance from Closest Margin (mm): 0      Specify Closest Margin (required only if <37mm): New posterior margin      Regional Lymph Nodes:      Number of Lymph Nodes Examined: 6      Number of Sentinel Nodes Examined: 6      Number of Lymph Nodes with Macrometastases (>2 mm): 0      Number of Lymph Nodes with Micrometastases: 0      Number of Lymph Nodes with Isolated Tumor Cells (=0.2 mm or =200 cells): 0      Size of Largest Metastatic Deposit (mm): NA      Extranodal Extension: NA      Distant Metastasis:      Distant Site(s) Involved: Not applicable      Breast Biomarker Testing Performed on Previous Biopsy:      Testing Performed on Case Number: [id number]      Estrogen Receptor:  100%, positive, strong staining intensity      Progesterone Receptor: 95%, positive, moderate staining intensity      HER2: Negative, 0      Pathologic Stage Classification (pTNM, AJCC 8th Edition): pT1b, pN0      Representative Tumor Block: 2/A      Comment(s): None      ([id number])     04/22/2023 Cancer Staging   Staging form: Breast, AJCC 8th Edition - Pathologic stage from 04/22/2023: Stage IA (pT1b, pN0, cM0, G3, ER+, PR+, HER2-, Oncotype DX score: 21) - Signed by Timmy Forbes, MD on 05/27/2023 Stage prefix: Initial diagnosis Multigene prognostic tests performed: Oncotype DX Recurrence score range: Greater than or equal to 11 Histologic grading system: 3 grade system   05/20/2023 Procedure   S/p re-excision.   1. 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       - CHANGES CONSISTENT WITH PRIOR LUMPTECTOMY SITE.       - UNREMARKABLE SKIN.    06/11/2023 - 07/09/2023 Radiation Therapy   Adjuvant left breast RT    Today patient presents to discuss adjuvant endocrinotherapy.  Sp RT, tolerated well. She has some focal skin irritation after RT.    MEDICAL HISTORY:  Past Medical History:  Diagnosis Date   Anal fissure 12/09/2016   Added automatically from request for surgery [id number]    Added automatically from request for surgery [id number]     Arthralgia of left knee 12/03/2020   Asthma 08/29/2022   Chronic/stable.    Follows with Dr. Lella Putt, pulmonology.   Using Flovent  and albuterol  inhaler  as needed.     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     Heart murmur    History of total knee arthroplasty 08/21/2021   Hyperlipidemia 08/29/2022   Lab Results   Component Value Date    Cholesterol 197 01/28/2022      Lab Results   Component Value Date    HDL 56 01/28/2022      Lab Results   Component Value Date    LDL Calculated 115 01/28/2022      Lab Results   Component Value Date    Triglycerides 131 01/28/2022      Lab Results   Component Value Date    Chol/HDL Ratio 3.5 01/28/2022      Criteria used to determine recommendation include:    Hypertension    Invasive carcinoma of breast (HCC)    Liver cirrhosis secondary to NASH (HCC)    Lung nodule    Major depressive disorder in full remission (HCC) 04/02/2020   Mood stable on venlafaxine 150 mg daily.     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 breast of female, estrogen receptor positive (HCC)    Migraine 08/29/2022   NAFLD (nonalcoholic fatty liver disease) 45/40/9811   Non-alcoholic cirrhosis (HCC)    Osteoarthritis of left knee 09/03/2020   Peritoneal hematoma 07/07/2019   Sprain of MCL (medial collateral ligament) of knee 09/03/2020   Symptom associated with female genital organs 07/09/2010    SURGICAL HISTORY: Past Surgical History:  Procedure Laterality Date   ABDOMINAL HYSTERECTOMY     AXILLARY SENTINEL NODE BIOPSY Left 04/22/2023   Procedure: AXILLARY SENTINEL NODE BIOPSY;  Surgeon: Flynn Hylan, MD;  Location: [facility];  Service: General;  Laterality: Left;   BREAST BIOPSY Left 03/31/2023   Us  Core bx, coil clip - path pending   BREAST BIOPSY Left 03/31/2023   US  LT BREAST BX W LOC DEV 1ST LESION IMG BX SPEC US  GUIDE 03/31/2023 [facility]   BREAST BIOPSY Left 04/20/2023   US  LT RADIO FREQUENCY TAG LOC US  GUIDE 04/20/2023 [facility]   BREAST LUMPECTOMY WITH RADIOFREQUENCY TAG IDENTIFICATION Left 04/22/2023   Procedure: BREAST LUMPECTOMY WITH RADIOFREQUENCY TAG IDENTIFICATION;  Surgeon: Flynn Hylan, MD;  Location: [facility];  Service: General;  Laterality: Left;   CARDIAC CATHETERIZATION  03/10/2012   Negative   CATARACT EXTRACTION Bilateral    CESAREAN SECTION     CHOLECYSTECTOMY     RE-EXCISION OF BREAST LUMPECTOMY Left 05/20/2023   Procedure: EXCISION, LESION, BREAST, REPEAT;  Surgeon: Flynn Hylan, MD;  Location: [facility];  Service: General;  Laterality: Left;   SPLENECTOMY     TONSILLECTOMY      SOCIAL HISTORY: Social History   Socioeconomic History   Marital status: Legally Separated    Spouse name: Not on file   Number of children: 4   Years of education: Not on file   Highest education level: GED or equivalent  Occupational History   Occupation: Retired  Tobacco Use   Smoking status: Former    Current packs/day: 0.00    Average packs/day: 2.0 packs/day for 7.2 years (14.5 ttl pk-yrs)  Types: Cigarettes    Start date: 65    Quit date: 06/10/1983    Years since quitting: 40.1   Smokeless tobacco: Never  Vaping Use   Vaping status: Never Used  Substance and Sexual Activity   Alcohol use: No   Drug use: No   Sexual activity: Not on file  Other Topics Concern   Not on file  Social History Narrative   Not on file   Social Drivers of Health   Financial Resource Strain: Low Risk  (04/13/2023)   Overall Financial Resource Strain (CARDIA)    Difficulty of Paying Living Expenses: Not very hard  Food Insecurity: No Food Insecurity (04/13/2023)   Hunger Vital Sign    Worried About Running Out of Food in the Last Year: Never true    Ran Out of Food in the Last Year: Never true  Transportation Needs: No Transportation Needs (04/13/2023)   PRAPARE - Administrator, Civil Service (Medical): No    Lack of Transportation (Non-Medical): No  Physical Activity: Unknown (04/13/2023)   Exercise Vital Sign    Days of Exercise per Week: 0 days    Minutes of Exercise per Session: Not on file  Stress: No Stress Concern Present (04/13/2023)   [employer]  of Occupational Health - Occupational Stress Questionnaire    Feeling of Stress : Not at all  Social Connections: Moderately Isolated (04/13/2023)   Social Connection and Isolation Panel [NHANES]    Frequency of Communication with Friends and Family: More than three times a week    Frequency of Social Gatherings with Friends and Family: Twice a week    Attends Religious Services: More than 4 times per year    Active Member of [employer] or Organizations: No    Attends Engineer, structural: Not on file    Marital Status: Separated  Intimate Partner Violence: Not At Risk (04/08/2023)   Humiliation, Afraid, Rape, and Kick questionnaire    Fear of Current or Ex-Partner: No    Emotionally Abused: No    Physically Abused: No    Sexually Abused: No    FAMILY HISTORY: Family History  Problem Relation Age of Onset   Transient ischemic attack Mother    Thyroid disease Mother    Arthritis Mother    Hearing loss Mother    High blood pressure Mother    High Cholesterol Mother    Miscarriages / Stillbirths Mother    Stroke Mother    Cancer Father 23       metastatic, possible lung or prostate primary   Arthritis Father    Asthma Father    COPD Father    Depression Father    Early death Father    Hearing loss Father    Heart disease Father    High Cholesterol Father    Breast cancer Sister 16   Arthritis Sister    Birth defects Sister    Cancer Sister        breast cancer   Diabetes Sister    High blood pressure Sister    Diabetes Paternal Grandmother    Arthritis Paternal Grandfather    Cancer Paternal Grandfather    Breast cancer Daughter 27       TNBC   Cervical cancer Daughter    Breast cancer Niece 16    ALLERGIES:  is allergic to penicillins, codeine, and zoloft [sertraline hcl].  MEDICATIONS:  Current Outpatient Medications  Medication Sig Dispense Refill   acetaminophen  (TYLENOL ) 500  MG tablet Take 1,000 mg by mouth every 6 (six) hours as needed.     albuterol   (PROVENTIL  HFA) 108 (90 BASE) MCG/ACT inhaler Inhale 1 puff into the lungs every 6 (six) hours as needed for wheezing or shortness of breath.     anastrozole (ARIMIDEX) 1 MG tablet Take 1 tablet (1 mg total) by mouth daily. 30 tablet 3   Calcium Carbonate-Vitamin D (OYSTER SHELL CALCIUM/D) 500-5 MG-MCG TABS Take 1 tablet by mouth daily.     clobetasol  cream (TEMOVATE ) 0.05 % Apply 1 Application topically 2 (two) times daily. (Patient taking differently: Apply 1 Application topically daily as needed.) 60 g 0   EFFEXOR XR 150 MG 24 hr capsule Take 150 mg by mouth daily with breakfast.     ezetimibe (ZETIA) 10 MG tablet Take 10 mg by mouth at bedtime.     gabapentin  (NEURONTIN ) 100 MG capsule Take 1 capsule (100 mg total) by mouth 2 (two) times daily. 120 capsule 0   lisinopril-hydrochlorothiazide (ZESTORETIC) 20-12.5 MG tablet Take 1 tablet by mouth every morning.     loratadine (CLARITIN) 10 MG tablet Take 10 mg by mouth daily as needed for allergies.     metFORMIN (GLUCOPHAGE-XR) 500 MG 24 hr tablet Take 1,000 mg by mouth 2 (two) times daily with a meal.     omeprazole  (PRILOSEC) 20 MG capsule Take 1 capsule (20 mg total) by mouth daily. 90 capsule 1   pravastatin (PRAVACHOL) 80 MG tablet Take 80 mg by mouth at bedtime.     fluticasone -salmeterol (WIXELA INHUB) 250-50 MCG/ACT AEPB Inhale 1 puff into the lungs in the morning and at bedtime. (Patient not taking: Reported on 07/23/2023) 60 each 12   No current facility-administered medications for this visit.    Review of Systems  Constitutional:  Negative for appetite change, chills, fatigue and fever.  HENT:   Negative for hearing loss and voice change.   Eyes:  Negative for eye problems.  Respiratory:  Negative for chest tightness and cough.   Cardiovascular:  Negative for chest pain.  Gastrointestinal:  Negative for abdominal distention, abdominal pain and blood in stool.  Endocrine: Negative for hot flashes.  Genitourinary:  Negative for  difficulty urinating and frequency.   Musculoskeletal:  Negative for arthralgias.  Skin:  Negative for itching and rash.  Neurological:  Negative for extremity weakness.  Hematological:  Negative for adenopathy.  Psychiatric/Behavioral:  Negative for confusion.      PHYSICAL EXAMINATION: ECOG PERFORMANCE STATUS: 1 - Symptomatic but completely ambulatory  Vitals:   07/23/23 0900  BP: 118/67  Pulse: 78  Resp: 18  Temp: (!) 96.2 F (35.7 C)  SpO2: 98%   Filed Weights   07/23/23 0900  Weight: 157 lb 12.8 oz (71.6 kg)    Physical Exam HENT:     Head: Normocephalic and atraumatic.  Eyes:     General: No scleral icterus. Cardiovascular:     Rate and Rhythm: Normal rate and regular rhythm.  Pulmonary:     Effort: Pulmonary effort is normal. No respiratory distress.  Abdominal:     General: There is no distension.  Musculoskeletal:        General: Normal range of motion.     Cervical back: Normal range of motion and neck supple.  Skin:    Findings: No erythema.  Neurological:     Mental Status: She is alert and oriented to person, place, and time. Mental status is at baseline.     Motor: No abnormal  muscle tone.  Psychiatric:        Mood and Affect: Mood and affect normal.      LABORATORY DATA:  I have reviewed the data as listed    Latest Ref Rng & Units 07/01/2023   10:32 AM 06/22/2023   10:19 AM 04/08/2023   12:14 PM  CBC  WBC 4.0 - 10.5 K/uL 8.9  8.6  9.8   Hemoglobin 12.0 - 15.0 g/dL 81.1  91.4  78.2   Hematocrit 36.0 - 46.0 % 39.3  39.5  39.3   Platelets 150 - 400 K/uL 369  391  396       Latest Ref Rng & Units 05/14/2023    8:25 AM 03/12/2023   11:44 AM 06/10/2013    7:10 AM  CMP  Glucose 70 - 99 mg/dL 956  213  086   BUN 8 - 23 mg/dL 24  21  20    Creatinine 0.44 - 1.00 mg/dL 5.78  4.69  6.29   Sodium 135 - 145 mmol/L 140  138  143   Potassium 3.5 - 5.1 mmol/L 3.7  4.4  4.0   Chloride 98 - 111 mmol/L 105  100  106   CO2 22 - 32 mmol/L 24  28  27     Calcium 8.9 - 10.3 mg/dL 9.5  9.8  9.2   Total Protein 6.0 - 8.3 g/dL  7.7  6.6   Total Bilirubin 0.2 - 1.2 mg/dL  0.4  0.3   Alkaline Phos 39 - 117 U/L  112  108   AST 0 - 37 U/L  45  32   ALT 0 - 35 U/L  35  47      RADIOGRAPHIC STUDIES: I have personally reviewed the radiological images as listed and agreed with the findings in the report. No results found.

## 2023-07-23 NOTE — Progress Notes (Signed)
 SUBJECTIVE: Pt returns for her 3 month L-Dex screen.    PAIN:  Are you having pain? No   SOZO SCREENING: Patient was assessed today using the SOZO machine to determine the lymphedema index score. This was compared to her baseline score. It was determined that she is within the recommended range when compared to her baseline and no further action is needed at this time. She will continue SOZO screenings. These are done every 3 months for 2 years post operatively followed by every 6 months for 2 years, and then annually.     L-DEX FLOWSHEETS                L-DEX LYMPHEDEMA SCREENING    Measurement Type Unilateral     L-DEX MEASUREMENT EXTREMITY Upper Extremity     POSITION  Standing     DOMINANT SIDE Right     At Risk Side Left     BASELINE SCORE (UNILATERAL) -0.7    L-DEX SCORE (UNILATERAL) -0.4    VALUE CHANGE (UNILAT) 0.3

## 2023-07-24 ENCOUNTER — Telehealth: Payer: Self-pay | Admitting: *Deleted

## 2023-07-24 NOTE — Telephone Encounter (Signed)
 Spoke to pt and all questions and concerns clarified.

## 2023-07-24 NOTE — Telephone Encounter (Signed)
 The patient called in today and she said that she did not know that she was supposed to get a CBC lab yesterday no one told her.  Then she says that she saw where a bone density is supposed to be done.  It was set for June and she called and they got her n for appointment   May 20 that scan.  The last question that she wanted to know is she knows that her anastrozole as at the pharmacy and she wants to know whether or not she should start it now or waiting to get the bone density on May 20.  She would like a phone call by the end of the day she said because she is worried

## 2023-07-28 ENCOUNTER — Ambulatory Visit
Admission: RE | Admit: 2023-07-28 | Discharge: 2023-07-28 | Disposition: A | Discharge status: Home / Self Care | Source: Ambulatory Visit | Attending: Oncology | Admitting: Oncology

## 2023-07-28 DIAGNOSIS — Z13820 Encounter for screening for osteoporosis: Secondary | ICD-10-CM | POA: Insufficient documentation

## 2023-07-28 DIAGNOSIS — M8589 Other specified disorders of bone density and structure, multiple sites: Secondary | ICD-10-CM | POA: Diagnosis not present

## 2023-07-28 DIAGNOSIS — C50919 Malignant neoplasm of unspecified site of unspecified female breast: Secondary | ICD-10-CM | POA: Diagnosis present

## 2023-08-10 ENCOUNTER — Ambulatory Visit
Admission: RE | Admit: 2023-08-10 | Discharge: 2023-08-10 | Disposition: A | Discharge status: Home / Self Care | Source: Ambulatory Visit | Attending: Radiation Oncology | Admitting: Radiation Oncology

## 2023-08-10 VITALS — BP 132/76 | HR 87 | Temp 97.7°F | Resp 16 | Wt 154.0 lb

## 2023-08-10 DIAGNOSIS — C50512 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 female breast: Secondary | ICD-10-CM

## 2023-08-10 NOTE — Progress Notes (Signed)
 Radiation Oncology Follow up Note  Name: Bianca Rogers   Date:   08/10/2023 MRN:  [id number] DOB: 11/13/1950    This 73 y.o. female presents to the clinic today for 1 month follow-up status post whole breast radiation to her left breast for stage Ia ER/PR positive invasive mammary carcinoma.  REFERRING PROVIDER: Jolanda Nation, NP  HPI: Is a 73 year old female now out 1 month having completed whole breast radiation to her left breast for ER positive ductal carcinoma in situ.  Seen today in routine follow-up she is doing fairly well.  She has been having a problem recently with cough seems to have originated when she started her endocrine therapy with Arimidex .  She otherwise specifically denies breast tenderness cough or bone pain..  COMPLICATIONS OF TREATMENT: none  FOLLOW UP COMPLIANCE: keeps appointments   PHYSICAL EXAM:  BP 132/76   Pulse 87   Temp 97.7 F (36.5 C) (Tympanic)   Resp 16   Wt 154 lb (69.9 kg)   BMI 26.43 kg/m  Lungs are clear to A&P cardiac examination essentially unremarkable with regular rate and rhythm. No dominant mass or nodularity is noted in either breast in 2 positions examined. Incision is well-healed. No axillary or supraclavicular adenopathy is appreciated. Cosmetic result is excellent.  Well-developed well-nourished patient in NAD. HEENT reveals PERLA, EOMI, discs not visualized.  Oral cavity is clear. No oral mucosal lesions are identified. Neck is clear without evidence of cervical or supraclavicular adenopathy. Lungs are clear to A&P. Cardiac examination is essentially unremarkable with regular rate and rhythm without murmur rub or thrill. Abdomen is benign with no organomegaly or masses noted. Motor sensory and DTR levels are equal and symmetric in the upper and lower extremities. Cranial nerves II through XII are grossly intact. Proprioception is intact. No peripheral adenopathy or edema is identified. No motor or sensory levels are noted. Crude visual  fields are within normal range.  RADIOLOGY RESULTS: No current films for review  PLAN: As in time patient is doing fairly well since completing radiation therapy with no significant side effects except cough.  Arimadex can cause cough in certain situations have asked her to discontinue her Arimidex  for about 2 weeks and should that improve she is to contact medical oncology for consideration of an another endocrine therapy.  Otherwise have asked to see her back in 6 months for follow-up.  Patient knows to call with any concerns.  I would like to take this opportunity to thank you for allowing me to participate in the care of your patient.Glenis Langdon, MD

## 2023-08-20 ENCOUNTER — Ambulatory Visit

## 2023-08-20 VITALS — BP 132/76 | Ht 64.0 in | Wt 154.0 lb

## 2023-08-20 DIAGNOSIS — Z0000 Encounter for general adult medical examination without abnormal findings: Secondary | ICD-10-CM

## 2023-08-20 NOTE — Progress Notes (Signed)
 Because this visit was a virtual/telehealth visit,  certain criteria was not obtained, such a blood pressure, CBG if applicable, and timed get up and go. Any medications not marked as taking were not mentioned during the medication reconciliation part of the visit. Any vitals not documented were not able to be obtained due to this being a telehealth visit or patient was unable to self-report a recent blood pressure reading due to a lack of equipment at home via telehealth. Vitals that have been documented are verbally provided by the patient.   This visit was performed by a medical professional under my direct supervision. I was immediately available for consultation/collaboration. I have reviewed and agree with the Annual Wellness Visit documentation.  Subjective:   Bianca Rogers is a 73 y.o. who presents for a Medicare Wellness preventive visit.  As a reminder, Annual Wellness Visits don't include a physical exam, and some assessments may be limited, especially if this visit is performed virtually. We may recommend an in-person follow-up visit with your provider if needed.  Visit Complete: Virtual I connected with  Bianca Rogers on 08/20/23 by a audio enabled telemedicine application and verified that I am speaking with the correct person using two identifiers.  Patient Location: Home  Provider Location: Home Office  I discussed the limitations of evaluation and management by telemedicine. The patient expressed understanding and agreed to proceed.  Vital Signs: Because this visit was a virtual/telehealth visit, some criteria may be missing or patient reported. Any vitals not documented were not able to be obtained and vitals that have been documented are patient reported.  VideoDeclined- This patient declined Librarian, academic. Therefore the visit was completed with audio only.  Persons Participating in Visit: Patient.  AWV Questionnaire: Yes: Patient  Medicare AWV questionnaire was completed by the patient on 08/18/2023; I have confirmed that all information answered by patient is correct and no changes since this date.  Cardiac Risk Factors include: advanced age (>66men, >10 women);hypertension;diabetes mellitus     Objective:    Today's Vitals   08/18/23 1535 08/20/23 0907  BP:  132/76  Weight:  154 lb (69.9 kg)  Height:  5' 4 (1.626 m)  PainSc: 0-No pain    Body mass index is 26.43 kg/m.     08/20/2023    9:07 AM 08/10/2023   10:27 AM 05/20/2023   11:03 AM 05/12/2023    9:55 AM 04/22/2023    8:16 AM 04/15/2023    9:07 AM 04/08/2023   11:10 AM  Advanced Directives  Does Patient Have a Medical Advance Directive? Yes Yes Yes Yes Yes Yes Yes  Type of Estate agent of [place];Living will Healthcare Power of [place];Living will Healthcare Power of [employer] Power of [employer] Power of [employer] Power of Attorney Living will;Healthcare Power of Attorney  Does patient want to make changes to medical advance directive? No - Patient declined No - Patient declined No - Patient declined No - Patient declined No - Patient declined No - Patient declined   Copy of Healthcare Power of Attorney in Chart? No - copy requested No - copy requested No - copy requested No - copy requested No - copy requested  No - copy requested    Current Medications (verified) Outpatient Encounter Medications as of 08/20/2023  Medication Sig   acetaminophen  (TYLENOL ) 500 MG tablet Take 1,000 mg by mouth every 6 (six) hours as needed.   albuterol  (PROVENTIL  HFA) 108 (90 BASE) MCG/ACT  inhaler Inhale 1 puff into the lungs every 6 (six) hours as needed for wheezing or shortness of breath.   anastrozole  (ARIMIDEX ) 1 MG tablet Take 1 tablet (1 mg total) by mouth daily.   Calcium Carbonate-Vitamin D (OYSTER SHELL CALCIUM/D) 500-5 MG-MCG TABS Take 1 tablet by mouth daily.   clobetasol  cream (TEMOVATE ) 0.05 % Apply 1  Application topically 2 (two) times daily. (Patient taking differently: Apply 1 Application topically daily as needed.)   EFFEXOR XR 150 MG 24 hr capsule Take 150 mg by mouth daily with breakfast.   ezetimibe (ZETIA) 10 MG tablet Take 10 mg by mouth at bedtime.   fluticasone -salmeterol (WIXELA INHUB) 250-50 MCG/ACT AEPB Inhale 1 puff into the lungs in the morning and at bedtime. (Patient not taking: Reported on 07/23/2023)   gabapentin  (NEURONTIN ) 100 MG capsule Take 1 capsule (100 mg total) by mouth 2 (two) times daily.   lisinopril-hydrochlorothiazide (ZESTORETIC) 20-12.5 MG tablet Take 1 tablet by mouth every morning.   loratadine (CLARITIN) 10 MG tablet Take 10 mg by mouth daily as needed for allergies.   metFORMIN (GLUCOPHAGE-XR) 500 MG 24 hr tablet Take 1,000 mg by mouth 2 (two) times daily with a meal.   omeprazole  (PRILOSEC) 20 MG capsule Take 1 capsule (20 mg total) by mouth daily.   pravastatin (PRAVACHOL) 80 MG tablet Take 80 mg by mouth at bedtime.   No facility-administered encounter medications on file as of 08/20/2023.    Allergies (verified) Penicillins, Codeine, and Zoloft [sertraline hcl]   History: Past Medical History:  Diagnosis Date   Anal fissure 12/09/2016   Added automatically from request for surgery [id number]    Added automatically from request for surgery [id number]     Arthralgia of left knee 12/03/2020   Asthma 08/29/2022   Chronic/stable.    Follows with Dr. Lella Putt, pulmonology.   Using Flovent  and albuterol  inhaler as needed.     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     Heart murmur    History of total  knee arthroplasty 08/21/2021   Hyperlipidemia 08/29/2022   Lab Results   Component Value Date    Cholesterol 197 01/28/2022      Lab Results   Component Value Date    HDL 56 01/28/2022      Lab Results   Component Value Date    LDL Calculated 115 01/28/2022      Lab Results   Component Value Date    Triglycerides 131 01/28/2022      Lab Results   Component Value Date    Chol/HDL Ratio 3.5 01/28/2022      Criteria used to determine recommendation include:    Hypertension    Invasive carcinoma of breast (HCC)    Liver cirrhosis secondary to NASH (HCC)    Lung nodule    Major depressive disorder in full remission (HCC) 04/02/2020   Mood stable on venlafaxine 150 mg daily.     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 breast of female, estrogen receptor positive (HCC)    Migraine 08/29/2022   NAFLD (nonalcoholic fatty liver disease) 31/51/7616   Non-alcoholic cirrhosis (HCC)  Osteoarthritis of left knee 09/03/2020   Peritoneal hematoma 07/07/2019   Sprain of MCL (medial collateral ligament) of knee 09/03/2020   Symptom associated with female genital organs 07/09/2010   Past Surgical History:  Procedure Laterality Date   ABDOMINAL HYSTERECTOMY     AXILLARY SENTINEL NODE BIOPSY Left 04/22/2023   Procedure: AXILLARY SENTINEL NODE BIOPSY;  Surgeon: Flynn Hylan, MD;  Location: [facility];  Service: General;  Laterality: Left;   BREAST BIOPSY Left 03/31/2023   Us  Core bx, coil clip - path pending   BREAST BIOPSY Left 03/31/2023   US  LT BREAST BX W LOC DEV 1ST LESION IMG BX SPEC US  GUIDE 03/31/2023 [facility]   BREAST BIOPSY Left 04/20/2023   US  LT RADIO FREQUENCY TAG LOC US  GUIDE 04/20/2023 [facility]   BREAST LUMPECTOMY WITH RADIOFREQUENCY TAG IDENTIFICATION Left 04/22/2023   Procedure: BREAST LUMPECTOMY WITH RADIOFREQUENCY TAG IDENTIFICATION;  Surgeon: Flynn Hylan, MD;  Location: [facility];  Service: General;  Laterality: Left;   CARDIAC CATHETERIZATION  03/10/2012    Negative   CATARACT EXTRACTION Bilateral    CESAREAN SECTION     CHOLECYSTECTOMY     RE-EXCISION OF BREAST LUMPECTOMY Left 05/20/2023   Procedure: EXCISION, LESION, BREAST, REPEAT;  Surgeon: Flynn Hylan, MD;  Location: [facility];  Service: General;  Laterality: Left;   SPLENECTOMY     TONSILLECTOMY     Family History  Problem Relation Age of Onset   Transient ischemic attack Mother    Thyroid disease Mother    Arthritis Mother    Hearing loss Mother    High blood pressure Mother    High Cholesterol Mother    Miscarriages / [place] Mother    Stroke Mother    Cancer Father 7       metastatic, possible lung or prostate primary   Arthritis Father    Asthma Father    COPD Father    Depression Father    Early death Father    Hearing loss Father    Heart disease Father    High Cholesterol Father    Breast cancer Sister 48   Arthritis Sister    Birth defects Sister    Cancer Sister        breast cancer   Diabetes Sister    High blood pressure Sister    Diabetes Paternal Grandmother    Arthritis Paternal Grandfather    Cancer Paternal Grandfather    Breast cancer Daughter 23       TNBC   Cervical cancer Daughter    Breast cancer Niece 28   Social History   Socioeconomic History   Marital status: Legally Separated    Spouse name: Not on file   Number of children: 4   Years of education: Not on file   Highest education level: GED or equivalent  Occupational History   Occupation: Retired  Tobacco Use   Smoking status: Former    Current packs/day: 0.00    Average packs/day: 2.0 packs/day for 7.2 years (14.5 ttl pk-yrs)    Types: Cigarettes    Start date: 69    Quit date: 06/10/1983    Years since quitting: 40.2   Smokeless tobacco: Never  Vaping Use   Vaping status: Never Used  Substance and Sexual Activity   Alcohol use: No   Drug use: No   Sexual activity: Not on file  Other Topics Concern   Not on file  Social History Narrative   Not on file    Social Drivers  of Health   Financial Resource Strain: Low Risk  (08/20/2023)   Overall Financial Resource Strain (CARDIA)    Difficulty of Paying Living Expenses: Not very hard  Food Insecurity: No Food Insecurity (08/20/2023)   Hunger Vital Sign    Worried About Running Out of Food in the Last Year: Never true    Ran Out of Food in the Last Year: Never true  Transportation Needs: No Transportation Needs (08/20/2023)   PRAPARE - Administrator, Civil Service (Medical): No    Lack of Transportation (Non-Medical): No  Physical Activity: Sufficiently Active (08/20/2023)   Exercise Vital Sign    Days of Exercise per Week: 7 days    Minutes of Exercise per Session: 30 min  Stress: No Stress Concern Present (08/20/2023)   [employer] of Occupational Health - Occupational Stress Questionnaire    Feeling of Stress: Not at all  Social Connections: Moderately Isolated (08/20/2023)   Social Connection and Isolation Panel    Frequency of Communication with Friends and Family: More than three times a week    Frequency of Social Gatherings with Friends and Family: Twice a week    Attends Religious Services: More than 4 times per year    Active Member of [employer] or Organizations: No    Attends Banker Meetings: Never    Marital Status: Separated    Tobacco Counseling Counseling given: Not Answered    Clinical Intake:  Pre-visit preparation completed: No  Pain : No/denies pain Pain Score: 0-No pain     BMI - recorded: 26.43 Nutritional Status: BMI 25 -29 Overweight Nutritional Risks: None Diabetes: Yes CBG done?: No Did pt. bring in CBG monitor from home?: No  Lab Results  Component Value Date   HGBA1C 7.2 (H) 03/12/2023   HGBA1C 8.1 (H) 06/10/2013     How often do you need to have someone help you when you read instructions, pamphlets, or other written materials from your doctor or pharmacy?: 1 - Never What is the last grade level you completed in  school?: 11th grade then GED  Interpreter Needed?: No  Information entered by :: Deone Leifheit,cma   Activities of Daily Living     08/18/2023    3:35 PM 05/20/2023   11:01 AM  In your present state of health, do you have any difficulty performing the following activities:  Hearing? 1 0  Vision? 0 0  Difficulty concentrating or making decisions? 0 0  Walking or climbing stairs? 0   Dressing or bathing? 0   Doing errands, shopping? 0   Preparing Food and eating ? N   Using the Toilet? N   In the past six months, have you accidently leaked urine? N   Do you have problems with loss of bowel control? N   Managing your Medications? N   Managing your Finances? Y   Housekeeping or managing your Housekeeping? N     Patient Care Team: Jolanda Nation, NP as PCP - General (General Practice) Waverly Hageman, RN as Oncology Nurse Navigator Timmy Forbes, MD as Consulting Physician (Oncology) Glenis Langdon, MD as Consulting Physician (Radiation Oncology) Vergia Glasgow, MD as Consulting Physician (Pulmonary Disease) [place], [place]  I have updated your Care Teams any recent Medical Services you may have received from other providers in the past year.     Assessment:   This is a routine wellness examination for [place].  Hearing/Vision screen Hearing Screening - Comments:: Patient has no hearing difficulties Vision  Screening - Comments:: Patient wears OTC readers    Goals Addressed             This Visit's Progress    Patient Stated       Patient would like to make some quilts       Depression Screen     08/20/2023    9:12 AM 06/09/2023   11:55 AM 04/17/2023    2:51 PM 04/08/2023   11:38 AM 03/02/2023    9:46 AM 03/02/2023    8:53 AM  PHQ 2/9 Scores  PHQ - 2 Score 2 1 2 1 1  0  PHQ- 9 Score 5 7 7  9      Fall Risk     08/18/2023    3:35 PM 06/09/2023   11:55 AM 04/17/2023    2:51 PM 03/02/2023    8:53 AM  Fall Risk   Falls in the past year? 0 0 0 0  Number falls in  past yr: 0 0 0 0  Injury with Fall? 0 0 0 0  Risk for fall due to : No Fall Risks No Fall Risks No Fall Risks No Fall Risks  Follow up Falls evaluation completed Falls evaluation completed Falls evaluation completed Falls evaluation completed    MEDICARE RISK AT HOME:  Medicare Risk at Home Any stairs in or around the home?: No If so, are there any without handrails?: (Patient-Rptd) No Home free of loose throw rugs in walkways, pet beds, electrical cords, etc?: (Patient-Rptd) Yes Adequate lighting in your home to reduce risk of falls?: (Patient-Rptd) Yes Life alert?: (Patient-Rptd) No Use of a cane, walker or w/c?: (Patient-Rptd) No Grab bars in the bathroom?: (Patient-Rptd) Yes Shower chair or bench in shower?: (Patient-Rptd) No Elevated toilet seat or a handicapped toilet?: (Patient-Rptd) Yes  TIMED UP AND GO:  Was the test performed?  No  Cognitive Function: 6CIT completed        08/20/2023    9:09 AM  6CIT Screen  What Year? 0 points  What month? 0 points  What time? 0 points  Count back from 20 0 points  Months in reverse 0 points  Repeat phrase 0 points  Total Score 0 points    Immunizations Immunization History  Administered Date(s) Administered   Fluad Quad(high Dose 65+) 11/30/2019, 12/14/2020, 02/04/2022   Fluad Trivalent(High Dose 65+) 03/02/2023   HIB (PRP-T) 07/11/2019   Hep A / Hep B 12/01/2012   Hep A, Unspecified 12/01/2012, 09/19/2013   Hep B, Unspecified 09/20/2011, 12/01/2012, 09/19/2013   Hepatitis B, Dialysis 04/25/2015   Influenza, High Dose Seasonal PF 12/05/2015, 02/11/2018, 03/07/2019   Influenza, Seasonal, Injecte, Preservative Fre 04/29/2012, 02/01/2013   Influenza,inj,Quad PF,6+ Mos 02/27/2015   Influenza-Unspecified 12/09/2019   Janssen (J&J) SARS-COV-2 Vaccination 10/12/2019   Meningococcal B, OMV 11/30/2019   Meningococcal Conjugate 07/11/2019   Meningococcal Mcv4o 11/30/2019   Pneumococcal Conjugate-13 04/27/2015, 07/11/2019    Pneumococcal Polysaccharide-23 11/11/2018   Pneumococcal-Unspecified 03/11/2019   Td 08/25/2011, 12/05/2013   Tdap 08/25/2011, 12/05/2013    Screening Tests Health Maintenance  Topic Date Due   OPHTHALMOLOGY EXAM  07/23/2023   Zoster Vaccines- Shingrix (1 of 2) 09/08/2023 (Originally 11/27/1969)   Colonoscopy  03/01/2024 (Originally 11/28/1994)   Hepatitis C Screening  03/01/2024 (Originally 11/28/1968)   COVID-19 Vaccine (2 - Janssen risk series) 04/16/2026 (Originally 11/09/2019)   HEMOGLOBIN A1C  09/09/2023   INFLUENZA VACCINE  10/09/2023   DTaP/Tdap/Td (5 - Td or Tdap) 12/06/2023   Diabetic kidney evaluation -  Urine ACR  03/11/2024   Diabetic kidney evaluation - eGFR measurement  05/13/2024   FOOT EXAM  06/08/2024   Medicare Annual Wellness (AWV)  08/19/2024   MAMMOGRAM  04/12/2025   Pneumococcal Vaccine: 50+ Years  Completed   DEXA SCAN  Completed   HPV VACCINES  Aged Out   Meningococcal B Vaccine  Aged Out    Health Maintenance  Health Maintenance Due  Topic Date Due   OPHTHALMOLOGY EXAM  07/23/2023   Health Maintenance Items Addressed:patient declined eye exam  Additional Screening:  Vision Screening: Recommended annual ophthalmology exams for early detection of glaucoma and other disorders of the eye. Would you like a referral to an eye doctor? No    Dental Screening: Recommended annual dental exams for proper oral hygiene  Community Resource Referral / Chronic Care Management: CRR required this visit?  No   CCM required this visit?  No   Plan:    I have personally reviewed and noted the following in the patient's chart:   Medical and social history Use of alcohol, tobacco or illicit drugs  Current medications and supplements including opioid prescriptions. Patient is not currently taking opioid prescriptions. Functional ability and status Nutritional status Physical activity Advanced directives List of other physicians Hospitalizations, surgeries, and  ER visits in previous 12 months Vitals Screenings to include cognitive, depression, and falls Referrals and appointments  In addition, I have reviewed and discussed with patient certain preventive protocols, quality metrics, and best practice recommendations. A written personalized care plan for preventive services as well as general preventive health recommendations were provided to patient.   Freeda Jerry, [place]   08/20/2023   After Visit Summary: (MyChart) Due to this being a telephonic visit, the after visit summary with patients personalized plan was offered to patient via MyChart   Notes: Nothing significant to report at this time.

## 2023-08-20 NOTE — Patient Instructions (Signed)
 Bianca Rogers , Thank you for taking time out of your busy schedule to complete your Annual Wellness Visit with me. I enjoyed our conversation and look forward to speaking with you again next year. I, as well as your care team,  appreciate your ongoing commitment to your health goals. Please review the following plan we discussed and let me know if I can assist you in the future. Your Game plan/ To Do List    Referrals: If you haven't heard from the office you've been referred to, please reach out to them at the phone provided.  None Follow up Visits: Next Medicare AWV with our clinical staff: patient declined   Have you seen your provider in the last 6 months (3 months if uncontrolled diabetes)? No Next Office Visit with your provider: 09/08/2023  Clinician Recommendations:  Aim for 30 minutes of exercise or brisk walking, 6-8 glasses of water , and 5 servings of fruits and vegetables each day.       This is a list of the screening recommended for you and due dates:  Health Maintenance  Topic Date Due   Eye exam for diabetics  07/23/2023   Zoster (Shingles) Vaccine (1 of 2) 09/08/2023*   Colon Cancer Screening  03/01/2024*   Hepatitis C Screening  03/01/2024*   COVID-19 Vaccine (2 - Janssen risk series) 04/16/2026*   Hemoglobin A1C  09/09/2023   Flu Shot  10/09/2023   DTaP/Tdap/Td vaccine (5 - Td or Tdap) 12/06/2023   Yearly kidney health urinalysis for diabetes  03/11/2024   Yearly kidney function blood test for diabetes  05/13/2024   Complete foot exam   06/08/2024   Medicare Annual Wellness Visit  08/19/2024   Mammogram  04/12/2025   Pneumococcal Vaccine for age over 42  Completed   DEXA scan (bone density measurement)  Completed   HPV Vaccine  Aged Out   Meningitis B Vaccine  Aged Out  *Topic was postponed. The date shown is not the original due date.    Advanced directives: (Declined) Advance directive discussed with you today. Even though you declined this today, please call  our office should you change your mind, and we can give you the proper paperwork for you to fill out. Advance Care Planning is important because it:  [x]  Makes sure you receive the medical care that is consistent with your values, goals, and preferences  [x]  It provides guidance to your family and loved ones and reduces their decisional burden about whether or not they are making the right decisions based on your wishes.  Follow the link provided in your after visit summary or read over the paperwork we have mailed to you to help you started getting your Advance Directives in place. If you need assistance in completing these, please reach out to us  so that we can help you!  See attachments for Preventive Care and Fall Prevention Tips.

## 2023-08-26 ENCOUNTER — Other Ambulatory Visit

## 2023-09-08 ENCOUNTER — Encounter: Payer: Self-pay | Admitting: General Practice

## 2023-09-08 ENCOUNTER — Telehealth (INDEPENDENT_AMBULATORY_CARE_PROVIDER_SITE_OTHER): Admitting: General Practice

## 2023-09-08 VITALS — BP 118/60 | HR 72 | Temp 98.3°F | Ht 64.0 in | Wt 153.0 lb

## 2023-09-08 DIAGNOSIS — Z7984 Long term (current) use of oral hypoglycemic drugs: Secondary | ICD-10-CM

## 2023-09-08 DIAGNOSIS — I10 Essential (primary) hypertension: Secondary | ICD-10-CM | POA: Diagnosis not present

## 2023-09-08 DIAGNOSIS — E1140 Type 2 diabetes mellitus with diabetic neuropathy, unspecified: Secondary | ICD-10-CM

## 2023-09-08 DIAGNOSIS — K219 Gastro-esophageal reflux disease without esophagitis: Secondary | ICD-10-CM

## 2023-09-08 DIAGNOSIS — J479 Bronchiectasis, uncomplicated: Secondary | ICD-10-CM

## 2023-09-08 DIAGNOSIS — K7460 Unspecified cirrhosis of liver: Secondary | ICD-10-CM

## 2023-09-08 DIAGNOSIS — R519 Headache, unspecified: Secondary | ICD-10-CM | POA: Insufficient documentation

## 2023-09-08 DIAGNOSIS — K7581 Nonalcoholic steatohepatitis (NASH): Secondary | ICD-10-CM | POA: Diagnosis not present

## 2023-09-08 DIAGNOSIS — C50919 Malignant neoplasm of unspecified site of unspecified female breast: Secondary | ICD-10-CM

## 2023-09-08 DIAGNOSIS — G8929 Other chronic pain: Secondary | ICD-10-CM

## 2023-09-08 LAB — LIPID PANEL
Cholesterol: 171 mg/dL (ref 0–200)
HDL: 49.2 mg/dL (ref >39.00)
LDL Cholesterol: 88 mg/dL (ref 0–99)
NonHDL: 122.07
Total CHOL/HDL Ratio: 3
Triglycerides: 172 mg/dL — ABNORMAL HIGH (ref 0.0–149.0)
VLDL: 34.4 mg/dL (ref 0.0–40.0)

## 2023-09-08 LAB — COMPREHENSIVE METABOLIC PANEL WITH GFR
ALT: 21 U/L (ref 0–35)
AST: 28 U/L (ref 0–37)
Albumin: 4.4 g/dL (ref 3.5–5.2)
Alkaline Phosphatase: 117 U/L (ref 39–117)
BUN: 26 mg/dL — ABNORMAL HIGH (ref 6–23)
CO2: 29 meq/L (ref 19–32)
Calcium: 10.1 mg/dL (ref 8.4–10.5)
Chloride: 100 meq/L (ref 96–112)
Creatinine, Ser: 1 mg/dL (ref 0.40–1.20)
GFR: 55.78 mL/min — ABNORMAL LOW (ref >60.00)
Glucose, Bld: 93 mg/dL (ref 70–99)
Potassium: 4.6 meq/L (ref 3.5–5.1)
Sodium: 138 meq/L (ref 135–145)
Total Bilirubin: 0.4 mg/dL (ref 0.2–1.2)
Total Protein: 7.1 g/dL (ref 6.0–8.3)

## 2023-09-08 LAB — HEMOGLOBIN A1C: Hgb A1c MFr Bld: 6.7 % — ABNORMAL HIGH (ref 4.6–6.5)

## 2023-09-08 LAB — CBC
HCT: 38.4 % (ref 36.0–46.0)
Hemoglobin: 12.6 g/dL (ref 12.0–15.0)
MCHC: 32.9 g/dL (ref 30.0–36.0)
MCV: 87.9 fl (ref 78.0–100.0)
Platelets: 366 10*3/uL (ref 150.0–400.0)
RBC: 4.37 Mil/uL (ref 3.87–5.11)
RDW: 14.2 % (ref 11.5–15.5)
WBC: 8.7 10*3/uL (ref 4.0–10.5)

## 2023-09-08 LAB — MICROALBUMIN / CREATININE URINE RATIO
Creatinine,U: 84.1 mg/dL
Microalb Creat Ratio: UNDETERMINED mg/g (ref 0.0–30.0)
Microalb, Ur: <0.7 mg/dL

## 2023-09-08 NOTE — Addendum Note (Signed)
 Addended by: VINCENTE SHIVERS on: 09/08/2023 12:24 PM   Modules accepted: Level of Service

## 2023-09-08 NOTE — Progress Notes (Signed)
 Virtual Visit via Video Note  I connected with Bianca Rogers on 09/08/23 at 10:00 AM EDT by a video enabled telemedicine application and verified that I am speaking with the correct person using two identifiers.  Patient Location: Other:  Provider office Provider Location: Home Office  I discussed the limitations, risks, security, and privacy concerns of performing an evaluation and management service by video and the availability of in person appointments. I also discussed with the patient that there may be a patient responsible charge related to this service. The patient expressed understanding and agreed to proceed.  Subjective: PCP: Vincente Shivers, NP  Chief Complaint  Patient presents with   Diabetes    Patient here today to follow up on DM, HTN, and other chronic conditions.    Diabetes   Bianca Rogers is a 73 year old female with past medical history of type 2 DM, HTN, bronchiectasis, GERD, liver cirrhosis secondary to NASH, osteopenia, breast cancer presents today for a follow up.   DM2: Current medications include: Metformin XR 1000 BID  She is checking her blood glucose once daily and is getting readings of 120s fasting and 127 in the non-fasting.  Last A1C: 7.2 in January, 2025.  Last Eye Exam: 07/23/22 in [place].  Last Foot Exam: April 2025 Pneumonia Vaccination: UTD Urine Microalbumin: due Statin: Pravastatin  Dietary changes since last visit: has not had much of an appetite but does try to make good choices.   Exercise: walks to her mail box and taking the trash.   NASH cirrhosis: following with gastroenterology. She is planning to set up the endoscopy and the colonoscopy.   Neck pain/chronic headache: she has chronic pain and was told that she has degenerative disc at C5 and C6. Worse on the right side. This has been causing her to have severe headaches. At times, it does cause dizziness. No recent falls or anything. She feels like this is also causing her  to have tremor's in her right. She gets about 10 days a day. She takes some ibuprofen  and lays down. It usually relieves it. She has done physical therapy and used TENs unit which did provider her relief in the past. She would like to see a neurologist as she was seeing one in [place].   ROS: Per HPI  Current Outpatient Medications:    acetaminophen  (TYLENOL ) 500 MG tablet, Take 1,000 mg by mouth every 6 (six) hours as needed., Disp: , Rfl:    albuterol  (PROVENTIL  HFA) 108 (90 BASE) MCG/ACT inhaler, Inhale 1 puff into the lungs every 6 (six) hours as needed for wheezing or shortness of breath., Disp: , Rfl:    anastrozole  (ARIMIDEX ) 1 MG tablet, Take 1 tablet (1 mg total) by mouth daily., Disp: 30 tablet, Rfl: 3   Calcium Carbonate (CALCIUM 500 PO), Take 500 mg by mouth in the morning and at bedtime., Disp: , Rfl:    clobetasol  cream (TEMOVATE ) 0.05 %, Apply 1 Application topically 2 (two) times daily. (Patient taking differently: Apply 1 Application topically daily as needed.), Disp: 60 g, Rfl: 0   EFFEXOR XR 150 MG 24 hr capsule, Take 150 mg by mouth daily with breakfast., Disp: , Rfl:    ezetimibe (ZETIA) 10 MG tablet, Take 10 mg by mouth at bedtime., Disp: , Rfl:    fluticasone -salmeterol (WIXELA INHUB) 250-50 MCG/ACT AEPB, Inhale 1 puff into the lungs in the morning and at bedtime., Disp: 60 each, Rfl: 12   gabapentin  (NEURONTIN ) 100 MG capsule, Take  1 capsule (100 mg total) by mouth 2 (two) times daily., Disp: 120 capsule, Rfl: 0   lisinopril-hydrochlorothiazide (ZESTORETIC) 20-12.5 MG tablet, Take 1 tablet by mouth every morning., Disp: , Rfl:    loratadine (CLARITIN) 10 MG tablet, Take 10 mg by mouth daily as needed for allergies., Disp: , Rfl:    metFORMIN (GLUCOPHAGE-XR) 500 MG 24 hr tablet, Take 1,000 mg by mouth 2 (two) times daily with a meal., Disp: , Rfl:    omeprazole  (PRILOSEC) 20 MG capsule, Take 1 capsule (20 mg total) by mouth daily., Disp: 90 capsule, Rfl: 1   pravastatin  (PRAVACHOL) 80 MG tablet, Take 80 mg by mouth at bedtime., Disp: , Rfl:    VITAMIN D, CHOLECALCIFEROL, PO, Take 2,000 Units by mouth daily., Disp: , Rfl:   Observations/Objective: Today's Vitals   09/08/23 1002  BP: 118/60  Pulse: 72  Temp: 98.3 F (36.8 C)  TempSrc: Oral  SpO2: 98%  Weight: 153 lb (69.4 kg)  Height: 5' 4 (1.626 m)   Physical Exam Nursing note reviewed.  Constitutional:      Appearance: Normal appearance.   Eyes:     Conjunctiva/sclera: Conjunctivae normal.   Pulmonary:     Effort: Pulmonary effort is normal.   Neurological:     Mental Status: She is alert and oriented to person, place, and time.   Psychiatric:        Mood and Affect: Mood normal.        Behavior: Behavior normal.        Thought Content: Thought content normal.        Judgment: Judgment normal.     Assessment and Plan: Type 2 diabetes mellitus with diabetic neuropathy, unspecified whether long term insulin  use (HCC) Assessment & Plan: Last A1c on 03/12/23 was 7.2. hemoglobin A1c pending.  Continue MEtformin 1000 mg BID. Declines refill.   Foot exam UTD.  Pneumonia vaccine UTD.  Eye exam- due Urine ACR- due and pending.   F/u in 3 months.  Orders: -     Hemoglobin A1c -     Microalbumin / creatinine urine ratio -     CBC -     Comprehensive metabolic panel with GFR -     Lipid panel  HTN (hypertension), benign Assessment & Plan: Controlled.   Continue Lisinopril-hydrochlorothiazide 20-12.5 mg once daily. Declines refill today.    Liver cirrhosis secondary to NASH [facility]) Assessment & Plan: Following with GI.  She will call and set up endoscopy and colonoscopy.   Gastroesophageal reflux disease, unspecified whether esophagitis present Assessment & Plan: Controlled.   Continue Omeprazole  20 mg once daily.   Chronic nonintractable headache, unspecified headache type Assessment & Plan: Chronic headache.  Limited exam due to virtual visit.  No red flags on exam.   She would like to establish with neurology in [place].   Orders: -     Ambulatory referral to Neurology  Bronchiectasis without complication [facility]) Assessment & Plan: Controlled.   Following with pulmonology.   Continue Wixela BID and Albuterol  PRN.    Invasive carcinoma of breast [facility]) Assessment & Plan: Following with Oncology.  Continue Arimidex  as per oncology.    Follow Up Instructions: Return in about 3 months (around 12/09/2023) for chronic care management..   I discussed the assessment and treatment plan with the patient. The patient was provided an opportunity to ask questions, and all were answered. The patient agreed with the plan and demonstrated an understanding of the instructions.   The  patient was advised to call back or seek an in-person evaluation if the symptoms worsen or if the condition fails to improve as anticipated.  The above assessment and management plan was discussed with the patient. The patient verbalized understanding of and has agreed to the management plan.   Carrol Aurora, NP

## 2023-09-08 NOTE — Assessment & Plan Note (Signed)
 Controlled.   Following with pulmonology.   Continue Wixela BID and Albuterol  PRN.

## 2023-09-08 NOTE — Assessment & Plan Note (Signed)
 Controlled.   Continue Omeprazole  20 mg once daily.

## 2023-09-08 NOTE — Assessment & Plan Note (Signed)
 Following with Oncology.  Continue Arimidex  as per oncology.

## 2023-09-08 NOTE — Assessment & Plan Note (Signed)
 Controlled.   Continue Lisinopril-hydrochlorothiazide 20-12.5 mg once daily. Declines refill today.

## 2023-09-08 NOTE — Patient Instructions (Addendum)
 Stop by the lab prior to leaving today. I will notify you of your results once received.   [facility] [address] [place] [address] [phone]  Call and make an eye doctor appt. [facility] in .  You will either be contacted via phone regarding your referral to neurology , or you may receive a letter on your MyChart portal from our referral team with instructions for scheduling an appointment. Please let us  know if you have not been contacted by anyone within two weeks.  Follow up in 3 months.   It was a pleasure to see you today!

## 2023-09-08 NOTE — Assessment & Plan Note (Signed)
 Last A1c on 03/12/23 was 7.2. hemoglobin A1c pending.  Continue MEtformin 1000 mg BID. Declines refill.   Foot exam UTD.  Pneumonia vaccine UTD.  Eye exam- due Urine ACR- due and pending.   F/u in 3 months.

## 2023-09-08 NOTE — Assessment & Plan Note (Signed)
 Following with GI.  She will call and set up endoscopy and colonoscopy.

## 2023-09-08 NOTE — Assessment & Plan Note (Signed)
 Chronic headache.  Limited exam due to virtual visit.  No red flags on exam.  She would like to establish with neurology in [place].

## 2023-09-09 ENCOUNTER — Ambulatory Visit: Payer: Self-pay | Admitting: General Practice

## 2023-09-24 ENCOUNTER — Encounter: Payer: Self-pay | Admitting: Gastroenterology

## 2023-10-01 ENCOUNTER — Other Ambulatory Visit: Payer: Self-pay | Admitting: *Deleted

## 2023-10-01 MED ORDER — VENLAFAXINE HCL ER 150 MG PO CP24: 150.0000 mg | ORAL_CAPSULE | Freq: Every day | ORAL | 0 refills | Status: DC

## 2023-10-01 NOTE — Telephone Encounter (Signed)
 Copied from CRM [id number]. Topic: Clinical - Medication Question >> Oct 01, 2023 11:21 AM Mesmerise C wrote: Reason for CRM: Patient says she's completely out of her Venlafaxine  150mg  only has 5 pills let and would like a prescription to be called in at [facility] on her file not showing on her med list patient can be reached at [phone]

## 2023-10-01 NOTE — Telephone Encounter (Signed)
 Refill sent as requested to [facility] [address]

## 2023-10-05 ENCOUNTER — Other Ambulatory Visit: Payer: Self-pay

## 2023-10-05 MED ORDER — ANASTROZOLE 1 MG PO TABS: 1.0000 mg | ORAL_TABLET | Freq: Every day | ORAL | 3 refills | Status: DC

## 2023-10-12 ENCOUNTER — Encounter: Payer: Self-pay | Admitting: Oncology

## 2023-10-12 ENCOUNTER — Inpatient Hospital Stay: Attending: Oncology | Admitting: *Deleted

## 2023-10-12 ENCOUNTER — Inpatient Hospital Stay (HOSPITAL_BASED_OUTPATIENT_CLINIC_OR_DEPARTMENT_OTHER): Admitting: Oncology

## 2023-10-12 ENCOUNTER — Other Ambulatory Visit: Payer: Self-pay | Admitting: General Practice

## 2023-10-12 ENCOUNTER — Inpatient Hospital Stay: Attending: Oncology

## 2023-10-12 VITALS — BP 137/66 | HR 70 | Temp 96.3°F | Resp 18 | Wt 151.4 lb

## 2023-10-12 DIAGNOSIS — M1909 Primary osteoarthritis, other specified site: Secondary | ICD-10-CM | POA: Diagnosis not present

## 2023-10-12 DIAGNOSIS — Z8049 Family history of malignant neoplasm of other genital organs: Secondary | ICD-10-CM | POA: Insufficient documentation

## 2023-10-12 DIAGNOSIS — C50919 Malignant neoplasm of unspecified site of unspecified female breast: Secondary | ICD-10-CM | POA: Diagnosis not present

## 2023-10-12 DIAGNOSIS — Z170 Estrogen receptor positive status [ER+]: Secondary | ICD-10-CM | POA: Diagnosis not present

## 2023-10-12 DIAGNOSIS — M8580 Other specified disorders of bone density and structure, unspecified site: Secondary | ICD-10-CM | POA: Insufficient documentation

## 2023-10-12 DIAGNOSIS — Z923 Personal history of irradiation: Secondary | ICD-10-CM | POA: Insufficient documentation

## 2023-10-12 DIAGNOSIS — Z803 Family history of malignant neoplasm of breast: Secondary | ICD-10-CM | POA: Insufficient documentation

## 2023-10-12 DIAGNOSIS — C50512 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 female breast: Secondary | ICD-10-CM | POA: Diagnosis present

## 2023-10-12 DIAGNOSIS — Z79811 Long term (current) use of aromatase inhibitors: Secondary | ICD-10-CM | POA: Diagnosis not present

## 2023-10-12 DIAGNOSIS — Z87891 Personal history of nicotine dependence: Secondary | ICD-10-CM | POA: Insufficient documentation

## 2023-10-12 LAB — CMP (CANCER CENTER ONLY)
ALT: 30 U/L (ref 0–44)
AST: 42 U/L — ABNORMAL HIGH (ref 15–41)
Albumin: 4 g/dL (ref 3.5–5.0)
Alkaline Phosphatase: 119 U/L (ref 38–126)
Anion gap: 8 (ref 5–15)
BUN: 26 mg/dL — ABNORMAL HIGH (ref 8–23)
CO2: 26 mmol/L (ref 22–32)
Calcium: 9.6 mg/dL (ref 8.9–10.3)
Chloride: 104 mmol/L (ref 98–111)
Creatinine: 1.2 mg/dL — ABNORMAL HIGH (ref 0.44–1.00)
GFR, Estimated: 48 mL/min — ABNORMAL LOW (ref >60)
Glucose, Bld: 114 mg/dL — ABNORMAL HIGH (ref 70–99)
Potassium: 4.1 mmol/L (ref 3.5–5.1)
Sodium: 138 mmol/L (ref 135–145)
Total Bilirubin: 0.5 mg/dL (ref 0.0–1.2)
Total Protein: 7.1 g/dL (ref 6.5–8.1)

## 2023-10-12 LAB — CBC WITH DIFFERENTIAL (CANCER CENTER ONLY)
Abs Immature Granulocytes: 0.02 K/uL (ref 0.00–0.07)
Basophils Absolute: 0.1 K/uL (ref 0.0–0.1)
Basophils Relative: 1 %
Eosinophils Absolute: 0.5 K/uL (ref 0.0–0.5)
Eosinophils Relative: 6 %
HCT: 36 % (ref 36.0–46.0)
Hemoglobin: 11.8 g/dL — ABNORMAL LOW (ref 12.0–15.0)
Immature Granulocytes: 0 %
Lymphocytes Relative: 32 %
Lymphs Abs: 2.7 K/uL (ref 0.7–4.0)
MCH: 29.6 pg (ref 26.0–34.0)
MCHC: 32.8 g/dL (ref 30.0–36.0)
MCV: 90.2 fL (ref 80.0–100.0)
Monocytes Absolute: 1.2 K/uL — ABNORMAL HIGH (ref 0.1–1.0)
Monocytes Relative: 15 %
Neutro Abs: 3.9 K/uL (ref 1.7–7.7)
Neutrophils Relative %: 46 %
Platelet Count: 366 K/uL (ref 150–400)
RBC: 3.99 MIL/uL (ref 3.87–5.11)
RDW: 14.6 % (ref 11.5–15.5)
WBC Count: 8.4 K/uL (ref 4.0–10.5)
nRBC: 0 % (ref 0.0–0.2)

## 2023-10-12 MED ORDER — ASPIRIN 81 MG PO CHEW: 81.0000 mg | CHEWABLE_TABLET | Freq: Every day | ORAL | Status: AC

## 2023-10-12 MED ORDER — TAMOXIFEN CITRATE 20 MG PO TABS: 20.0000 mg | ORAL_TABLET | Freq: Every day | ORAL | 3 refills | Status: AC

## 2023-10-12 MED ORDER — METFORMIN HCL ER 500 MG PO TB24: 1000.0000 mg | ORAL_TABLET | Freq: Two times a day (BID) | ORAL | 0 refills | Status: DC

## 2023-10-12 NOTE — Assessment & Plan Note (Signed)
 VUS in PTEN

## 2023-10-12 NOTE — Assessment & Plan Note (Addendum)
 Left breast invasive carcinoma G3, with high-grade DCIS s/p lumpectomy SLNB, positive margin, s/p re-excision. pT1b pN0 ER100%, PR 95% HER2 (0) Oncotype Dx score 21, will not offer adjuvant chemotherapy.  S/p adjuvant radiation  Recommend adjuvant endocrine therapy.  Patient tolerates Arimidex .  Bone density showed worse of osteopenia. Options of continued Arimidex  plus bisphosphonate versus switching to tamoxifen  were reviewed with patient.  She has ongoing dental problems and would like to switch to tamoxifen .  Rationale and potential side effects were reviewed with patient. She agrees.  Recommend patient to switch to tamoxifen  20 mg daily.   Recommend aspirin  81 mg daily for thrombosis prophylaxis.SABRA

## 2023-10-12 NOTE — Telephone Encounter (Signed)
 Copied from CRM [id number]. Topic: Clinical - Medication Refill >> Oct 12, 2023  9:45 AM Gennette ORN wrote: Medication: metFORMIN  (GLUCOPHAGE -XR) 500 MG 24 hr tablet  venlafaxine  XR (EFFEXOR  XR) 150 MG 24 hr capsule   Has the patient contacted their pharmacy? Yes (Agent: If no, request that the patient contact the pharmacy for the refill. If patient does not wish to contact the pharmacy document the reason why and proceed with request.) (Agent: If yes, when and what did the pharmacy advise?)  This is the patient's preferred pharmacy:  [facility] - [place], [place] - [address] [place] [address] Phone: [phone] Fax: [phone]  Is this the correct pharmacy for this prescription? Yes If no, delete pharmacy and type the correct one.   Has the prescription been filled recently? Yes  Is the patient out of the medication? Yes  Has the patient been seen for an appointment in the last year OR does the patient have an upcoming appointment? Yes  Can we respond through MyChart? Yes  Agent: Please be advised that Rx refills may take up to 3 business days. We ask that you follow-up with your pharmacy.

## 2023-10-12 NOTE — Assessment & Plan Note (Signed)
 DEXA results showed osteopenia, FRAX score 10-year major pathological fracture 21.9%. Recommend calcium and vitamin D supplementation.  Recommend patient to discuss with primary care provider for treatments.

## 2023-10-12 NOTE — Progress Notes (Signed)
 SUBJECTIVE: Pt returns for her 3 month L-Dex screen.    PAIN:  Are you having pain? No   SOZO SCREENING: Patient was assessed today using the SOZO machine to determine the lymphedema index score. This was compared to her baseline score. It was determined that she is within the recommended range when compared to her baseline and no further action is needed at this time. She will continue SOZO screenings. These are done every 3 months for 2 years post operatively followed by every 6 months for 2 years, and then annually.     L-DEX FLOWSHEETS                L-DEX LYMPHEDEMA SCREENING    Measurement Type Unilateral     L-DEX MEASUREMENT EXTREMITY Upper Extremity     POSITION  Standing     DOMINANT SIDE Right     At Risk Side Left     BASELINE SCORE (UNILATERAL) -0.7    L-DEX SCORE (UNILATERAL) -1.9    VALUE CHANGE (UNILAT) -1.2

## 2023-10-12 NOTE — Progress Notes (Signed)
 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 pT1b pN0 ER100%, PR 95% HER2 (0) Oncotype Dx score 21, will not offer adjuvant chemotherapy.  S/p adjuvant radiation  Recommend adjuvant endocrine therapy.  Patient tolerates Arimidex .  Bone density showed worse of osteopenia. Options of continued Arimidex  plus bisphosphonate versus switching to tamoxifen  were reviewed with patient.  She has ongoing dental problems and would like to switch to tamoxifen .  Rationale and potential side effects were reviewed with patient. She agrees.  Recommend patient to switch to tamoxifen  20 mg daily.   Recommend aspirin  81 mg daily for thrombosis prophylaxis..   Family history of breast cancer VUS in PTEN   Osteopenia DEXA results showed osteopenia, FRAX score 10-year major pathological fracture 21.9%. Recommend calcium and vitamin D supplementation.  Recommend patient to discuss with primary care provider for treatments.   Orders Placed This Encounter  Procedures   CBC with Differential (Cancer Center Only)    Standing Status:   Future    Expected Date:   01/12/2024    Expiration Date:   04/11/2024   CMP (Cancer Center only)    Standing Status:   Future    Expected Date:   01/12/2024    Expiration Date:   04/11/2024    Follow-up  3 months.  All questions were answered. The patient knows to call the clinic with any problems, questions or concerns.  Call Babara, MD, PhD [facility] Hematology Oncology 10/12/2023    HISTORY OF PRESENTING ILLNESS:  Bianca Rogers 73 y.o. female presents to establish care for left breast invasive carcinoma I have reviewed her chart and materials related to her cancer extensively and collaborated history with the patient. Summary of oncologic history is as follows: Oncology History  Invasive carcinoma of breast (HCC)  03/26/2023 Mammogram   Diagnostic unilateral left breast mammogram showed 1. 0.8 cm suspicious mass within the LOWER OUTER LEFT breast, likely representing the enlarging mammographic finding. Tissue sampling is recommended. No abnormal appearing LEFT axillary lymph nodes.   04/08/2023 Initial Diagnosis   Invasive carcinoma of breast [facility])  Patient presented for left breast diagnostic mammogram for follow-up of left breast asymmetry from outside facility.  She denies any breast concerns.  Left breast 5:00 4 cm from nipple needle core biopsy showed - INVASIVE MAMMARY CARCINOMA, NO SPECIAL TYPE.       - TUBULE FORMATION: SCORE 3       - NUCLEAR PLEOMORPHISM: SCORE 3       - MITOTIC COUNT: SCORE 2       - TOTAL SCORE: 8       - OVERALL GRADE: 3       - LYMPHOVASCULAR INVASION: NOT IDENTIFIED       - CANCER LENGTH: 8 MM       - CALCIFICATIONS: NOT IDENTIFIED       -  DUCTAL CARCINOMA IN SITU: PRESENT, HIGH-GRADE   ER 100%, PR 95%, HER2 negative [0]   Menarche at age of 64 or 67. First live birth at age of 75 OCP use: Less than 5 years of use. History of hysterectomy: Yes with removal of 1 ovary. Menopausal status: Postmenopausal History of HRT use: Denies History of chest radiation: Denies Number of previous breast biopsies: Denies Family history positive for multiple family members with breast cancer.     04/08/2023 Cancer Staging   Staging form:  Breast, AJCC 8th Edition - Clinical stage from 04/08/2023: Stage IA (cT1b, cN0, cM0, G3, ER+, PR+, HER2-) - Signed by Babara Call, MD on 04/08/2023 Stage prefix: Initial diagnosis Histologic grading system: 3 grade system    Genetic Testing   Negative CancerNext-Expanded +RNAinsight panel. 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 The CancerNext-Expanded gene panel offered by [facility] and includes sequencing, rearrangement, and RNA analysis for the following 76 genes: AIP, ALK, APC, ATM, AXIN2, BAP1, BARD1, BMPR1A, BRCA1, BRCA2, BRIP1, CDC73, CDH1, CDK4, CDKN1B, CDKN2A, CEBPA, CHEK2, CTNNA1, DDX41, DICER1, ETV6, FH, FLCN, GATA2, LZTR1, MAX, MBD4, MEN1, MET, MLH1, MSH2, MSH3, MSH6, MUTYH, NF1, NF2, NTHL1, PALB2, PHOX2B, PMS2, POT1, PRKAR1A, PTCH1, PTEN, RAD51C, RAD51D, RB1, RET, RUNX1, SDHA, SDHAF2, SDHB, SDHC, SDHD, SMAD4, SMARCA4, SMARCB1, SMARCE1, STK11, SUFU, TMEM127, TP53, TSC1, TSC2, VHL, and WT1 (sequencing and deletion/duplication); EGFR, HOXB13, KIT, MITF, PDGFRA, POLD1, and POLE (sequencing only); EPCAM and GREM1 (deletion/duplication only). Report date 05/07/23.    04/13/2023 Imaging   MRI breast bilateral w wo contrast   1. Biopsy-proven 0.8 cm malignancy within the posterior LOWER LEFT breast. No other suspicious abnormalities noted within either breast although motion artifact slightly decreases sensitivity. 2. No abnormal appearing lymph nodes.      04/22/2023 Surgery   Patient underwent left breast lumpectomy and sentinel lymph node biopsy. Pathology showed 1. Lymph node, sentinel, biopsy, #1 :      ONE LYMPH NODE, NEGATIVE FOR METASTATIC CARCINOMA (0/1).       2. Breast, lumpectomy, Left inferior tissue :      INVASIVE DUCTAL CARCINOMA, 1.0 CM, GRADE 3      DUCTAL CARCINOMA IN SITU:  SOLID AND CRIBRIFORM TYPES, INTERMEDIATE TO HIGH      GRADE      MARGINS, INVASIVE:  NEGATIVE      CLOSEST, INVASIVE:  1.5 MM FROM INFERIOR MARGIN      MARGINS, DCIS:  NEGATIVE      CLOSEST, DCIS:  1.5  MM FROM LATERAL MARGIN (SEE PART 4 FOR FINAL POSTERIOR      MARGIN)      LYMPHOVASCULAR INVASION:  NOT IDENTIFIED      PROGNOSTIC MARKERS:      ER: 100%, POSITIVE, STRONG STAINING INTENSITY      PR: 95%, POSITIVE,  MODERATE STAINING INTENSITY      HER2: NEGATIVE, 0      OTHER:  NONE      SEE ONCOLOGY TABLE       3. Lymph node, sentinel, biopsy, #2,3.4.5,6,7 :      FIVE LYMPH NODES, NEGATIVE FOR METASTATIC CARCINOMA (0/5).       4. Breast, lumpectomy, Left, additional superior and postreior margins :      DUCTAL CARCINOMA IN SITU, HIGH GRADE.      DCIS INVOLVES INKED BLACK MARGIN / NEW POSTERIOR MARGIN.      NEW SUPERIOR MARGIN IS NEGATIVE FOR CARCINOMA. Diagnosis Note : INVASIVE CARCINOMA OF THE BREAST:  Resection  Procedure: Lumpectomy      Specimen Laterality: Left      Histologic Type: Invasive ductal carcinoma      Histologic Grade:      Glandular (Acinar)/Tubular Differentiation: 3      Nuclear Pleomorphism: 3      Mitotic Rate: 2      Overall Grade: 3      Tumor Size: 1.0 cm      Ductal Carcinoma In Situ: Solid and cribriform types, intermediate to high grade      Lymphatic and/or Vascular Invasion:  Not identified      Treatment Effect in the Breast: No known presurgical therapy      Margins: All margins negative for invasive carcinoma      Distance from Closest Margin (mm): 1.5 mm from inferior margin      Specify Closest Margin (required only if <14mm): 1.5 mm from inferior margin      DCIS Margins: Involved by DCIS      Distance from Closest Margin (mm): 0      Specify Closest Margin (required only if <56mm): New posterior margin      Regional Lymph Nodes:      Number of Lymph Nodes Examined: 6      Number of Sentinel Nodes Examined: 6      Number of Lymph Nodes with Macrometastases (>2 mm): 0      Number of Lymph Nodes with Micrometastases: 0      Number of Lymph Nodes with Isolated Tumor Cells (=0.2 mm or =200 cells): 0      Size of Largest Metastatic Deposit  (mm): NA      Extranodal Extension: NA      Distant Metastasis:      Distant Site(s) Involved: Not applicable      Breast Biomarker Testing Performed on Previous Biopsy:      Testing Performed on Case Number: [id number]      Estrogen Receptor: 100%, positive, strong staining intensity      Progesterone Receptor: 95%, positive, moderate staining intensity      HER2: Negative, 0      Pathologic Stage Classification (pTNM, AJCC 8th Edition): pT1b, pN0      Representative Tumor Block: 2/A      Comment(s): None      ([id number])     04/22/2023 Cancer Staging   Staging form: Breast, AJCC 8th Edition - Pathologic stage from 04/22/2023: Stage IA (pT1b, pN0, cM0, G3, ER+, PR+, HER2-, Oncotype DX score: 21) - Signed by Babara Call, MD on 05/27/2023 Stage prefix: Initial diagnosis Multigene prognostic tests performed: Oncotype DX Recurrence score range: Greater than or equal to 11 Histologic grading system: 3 grade system   05/20/2023 Procedure   S/p re-excision.   1. 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       - CHANGES CONSISTENT WITH PRIOR LUMPTECTOMY SITE.       - UNREMARKABLE SKIN.    06/11/2023 - 07/09/2023 Radiation Therapy   Adjuvant left breast RT    Patient tolerates Arimidex  with some difficulties.  She continues to have arthritic pain in her fingers. She feels that gait is unsteady and she has had fall episodes.  Hands are shaky.    MEDICAL HISTORY:  Past Medical History:  Diagnosis Date  Anal fissure 12/09/2016   Added automatically from request for surgery [id number]    Added automatically from request for surgery [id number]     Arthralgia of left knee 12/03/2020   Asthma 08/29/2022   Chronic/stable.    Follows with Dr. Elayne, pulmonology.   Using Flovent  and albuterol  inhaler as needed.     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     Heart murmur    History of total knee arthroplasty 08/21/2021   Hyperlipidemia 08/29/2022   Lab Results   Component Value Date    Cholesterol 197 01/28/2022      Lab Results   Component Value Date    HDL 56 01/28/2022      Lab Results   Component Value Date    LDL Calculated 115 01/28/2022      Lab Results   Component Value Date    Triglycerides 131 01/28/2022      Lab Results   Component Value Date    Chol/HDL Ratio 3.5 01/28/2022      Criteria used to determine recommendation include:    Hypertension    Invasive carcinoma of breast (HCC)    Liver cirrhosis secondary to NASH (HCC)    Lung nodule    Major depressive disorder in full remission (HCC) 04/02/2020   Mood stable on venlafaxine  150 mg daily.     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 breast of female, estrogen receptor positive (HCC)    Migraine 08/29/2022   NAFLD (nonalcoholic fatty liver disease) 92/76/7985   Non-alcoholic cirrhosis (HCC)    Osteoarthritis of left knee 09/03/2020   Peritoneal hematoma 07/07/2019   Sprain of MCL (medial collateral ligament) of knee 09/03/2020   Symptom associated with female genital organs 07/09/2010    SURGICAL HISTORY: Past Surgical History:  Procedure Laterality Date   ABDOMINAL HYSTERECTOMY     AXILLARY SENTINEL NODE BIOPSY Left 04/22/2023   Procedure: AXILLARY SENTINEL NODE BIOPSY;  Surgeon: Lane Shope, MD;  Location: [facility];  Service: General;  Laterality: Left;   BREAST BIOPSY Left 03/31/2023   Us  Core bx, coil clip - path pending   BREAST BIOPSY Left 03/31/2023   US  LT BREAST BX W LOC DEV 1ST LESION  IMG BX SPEC US  GUIDE 03/31/2023 [facility]   BREAST BIOPSY Left 04/20/2023   US  LT RADIO FREQUENCY TAG LOC US  GUIDE 04/20/2023 [facility]   BREAST LUMPECTOMY WITH RADIOFREQUENCY TAG IDENTIFICATION Left 04/22/2023   Procedure: BREAST LUMPECTOMY WITH RADIOFREQUENCY TAG IDENTIFICATION;  Surgeon: Lane Shope, MD;  Location: [facility];  Service: General;  Laterality: Left;   CARDIAC CATHETERIZATION  03/10/2012   Negative   CATARACT EXTRACTION Bilateral    CESAREAN SECTION     CHOLECYSTECTOMY     RE-EXCISION OF BREAST LUMPECTOMY Left 05/20/2023   Procedure: EXCISION, LESION, BREAST, REPEAT;  Surgeon: Lane Shope, MD;  Location: [facility];  Service: General;  Laterality: Left;   SPLENECTOMY     TONSILLECTOMY      SOCIAL HISTORY: Social History   Socioeconomic History   Marital status: Legally Separated    Spouse name: Not on file   Number of children:  4   Years of education: Not on file   Highest education level: GED or equivalent  Occupational History   Occupation: Retired  Tobacco Use   Smoking status: Former    Current packs/day: 0.00    Average packs/day: 2.0 packs/day for 7.2 years (14.5 ttl pk-yrs)    Types: Cigarettes    Start date: 39    Quit date: 06/10/1983    Years since quitting: 40.3   Smokeless tobacco: Never  Vaping Use   Vaping status: Never Used  Substance and Sexual Activity   Alcohol use: No   Drug use: No   Sexual activity: Not on file  Other Topics Concern   Not on file  Social History Narrative   Not on file   Social Drivers of Health   Financial Resource Strain: Patient Declined (09/04/2023)   Overall Financial Resource Strain (CARDIA)    Difficulty of Paying Living Expenses: Patient declined  Food Insecurity: No Food Insecurity (09/04/2023)   Hunger Vital Sign    Worried About Running Out of Food in the Last Year: Never true    Ran Out of Food in the Last Year: Never true  Transportation Needs: No Transportation Needs (09/04/2023)    PRAPARE - Administrator, Civil Service (Medical): No    Lack of Transportation (Non-Medical): No  Physical Activity: Insufficiently Active (09/04/2023)   Exercise Vital Sign    Days of Exercise per Week: 2 days    Minutes of Exercise per Session: 30 min  Stress: Stress Concern Present (09/04/2023)   [employer] of Occupational Health - Occupational Stress Questionnaire    Feeling of Stress: To some extent  Social Connections: Moderately Isolated (09/04/2023)   Social Connection and Isolation Panel    Frequency of Communication with Friends and Family: More than three times a week    Frequency of Social Gatherings with Friends and Family: Twice a week    Attends Religious Services: More than 4 times per year    Active Member of [employer] or Organizations: No    Attends Engineer, structural: Not on file    Marital Status: Separated  Intimate Partner Violence: Not At Risk (08/20/2023)   Humiliation, Afraid, Rape, and Kick questionnaire    Fear of Current or Ex-Partner: No    Emotionally Abused: No    Physically Abused: No    Sexually Abused: No    FAMILY HISTORY: Family History  Problem Relation Age of Onset   Transient ischemic attack Mother    Thyroid disease Mother    Arthritis Mother    Hearing loss Mother    High blood pressure Mother    High Cholesterol Mother    Miscarriages / Stillbirths Mother    Stroke Mother    Cancer Father 51       metastatic, possible lung or prostate primary   Arthritis Father    Asthma Father    COPD Father    Depression Father    Early death Father    Hearing loss Father    Heart disease Father    High Cholesterol Father    Breast cancer Sister 54   Arthritis Sister    Birth defects Sister    Cancer Sister        breast cancer   Diabetes Sister    High blood pressure Sister    Diabetes Paternal Grandmother    Arthritis Paternal Grandfather    Cancer Paternal Grandfather    Breast cancer Daughter 4  TNBC   Cervical cancer Daughter    Breast cancer Niece 52    ALLERGIES:  is allergic to penicillins, codeine, and zoloft [sertraline hcl].  MEDICATIONS:  Current Outpatient Medications  Medication Sig Dispense Refill   acetaminophen  (TYLENOL ) 500 MG tablet Take 1,000 mg by mouth every 6 (six) hours as needed.     albuterol  (PROVENTIL  HFA) 108 (90 BASE) MCG/ACT inhaler Inhale 1 puff into the lungs every 6 (six) hours as needed for wheezing or shortness of breath.     aspirin  (ASPIRIN  81) 81 MG chewable tablet Chew 1 tablet (81 mg total) by mouth daily.     Calcium Carbonate (CALCIUM 500 PO) Take 500 mg by mouth in the morning and at bedtime.     clobetasol  cream (TEMOVATE ) 0.05 % Apply 1 Application topically 2 (two) times daily. 60 g 0   ezetimibe (ZETIA) 10 MG tablet Take 10 mg by mouth at bedtime.     fluticasone -salmeterol (WIXELA INHUB) 250-50 MCG/ACT AEPB Inhale 1 puff into the lungs in the morning and at bedtime. 60 each 12   gabapentin  (NEURONTIN ) 100 MG capsule Take 1 capsule (100 mg total) by mouth 2 (two) times daily. 120 capsule 0   lisinopril-hydrochlorothiazide (ZESTORETIC) 20-12.5 MG tablet Take 1 tablet by mouth every morning.     loratadine (CLARITIN) 10 MG tablet Take 10 mg by mouth daily as needed for allergies.     omeprazole  (PRILOSEC) 20 MG capsule Take 1 capsule (20 mg total) by mouth daily. 90 capsule 1   pravastatin (PRAVACHOL) 80 MG tablet Take 80 mg by mouth at bedtime.     tamoxifen  (NOLVADEX ) 20 MG tablet Take 1 tablet (20 mg total) by mouth daily. 90 tablet 3   venlafaxine  XR (EFFEXOR  XR) 150 MG 24 hr capsule Take 1 capsule (150 mg total) by mouth daily with breakfast. 90 capsule 0   VITAMIN D, CHOLECALCIFEROL, PO Take 2,000 Units by mouth daily.     metFORMIN  (GLUCOPHAGE -XR) 500 MG 24 hr tablet Take 2 tablets (1,000 mg total) by mouth 2 (two) times daily with a meal. 60 tablet 0   No current facility-administered medications for this visit.    Review of  Systems  Constitutional:  Negative for appetite change, chills, fatigue and fever.  HENT:   Negative for hearing loss and voice change.   Eyes:  Negative for eye problems.  Respiratory:  Negative for chest tightness and cough.   Cardiovascular:  Negative for chest pain.  Gastrointestinal:  Negative for abdominal distention, abdominal pain and blood in stool.  Endocrine: Negative for hot flashes.  Genitourinary:  Negative for difficulty urinating and frequency.   Musculoskeletal:  Positive for arthralgias and gait problem.  Skin:  Negative for itching and rash.  Neurological:  Positive for gait problem. Negative for extremity weakness.  Hematological:  Negative for adenopathy.  Psychiatric/Behavioral:  Negative for confusion.      PHYSICAL EXAMINATION: ECOG PERFORMANCE STATUS: 1 - Symptomatic but completely ambulatory  Vitals:   10/12/23 0953  BP: 137/66  Pulse: 70  Resp: 18  Temp: (!) 96.3 F (35.7 C)  SpO2: 100%   Filed Weights   10/12/23 0953  Weight: 151 lb 6.4 oz (68.7 kg)    Physical Exam HENT:     Head: Normocephalic and atraumatic.  Eyes:     General: No scleral icterus. Cardiovascular:     Rate and Rhythm: Normal rate and regular rhythm.  Pulmonary:     Effort: Pulmonary effort is normal. No  respiratory distress.  Abdominal:     General: There is no distension.  Musculoskeletal:        General: Normal range of motion.     Cervical back: Normal range of motion and neck supple.  Skin:    Findings: No erythema.  Neurological:     Mental Status: She is alert and oriented to person, place, and time. Mental status is at baseline.     Motor: No abnormal muscle tone.  Psychiatric:        Mood and Affect: Mood and affect normal.      LABORATORY DATA:  I have reviewed the data as listed    Latest Ref Rng & Units 10/12/2023    9:30 AM 09/08/2023   10:32 AM 07/01/2023   10:32 AM  CBC  WBC 4.0 - 10.5 K/uL 8.4  8.7  8.9   Hemoglobin 12.0 - 15.0 g/dL 88.1  87.3   87.4   Hematocrit 36.0 - 46.0 % 36.0  38.4  39.3   Platelets 150 - 400 K/uL 366  366.0  369       Latest Ref Rng & Units 10/12/2023    9:30 AM 09/08/2023   10:32 AM 05/14/2023    8:25 AM  CMP  Glucose 70 - 99 mg/dL 885  93  861   BUN 8 - 23 mg/dL 26  26  24    Creatinine 0.44 - 1.00 mg/dL 8.79  8.99  8.94   Sodium 135 - 145 mmol/L 138  138  140   Potassium 3.5 - 5.1 mmol/L 4.1  4.6  3.7   Chloride 98 - 111 mmol/L 104  100  105   CO2 22 - 32 mmol/L 26  29  24    Calcium 8.9 - 10.3 mg/dL 9.6  89.8  9.5   Total Protein 6.5 - 8.1 g/dL 7.1  7.1    Total Bilirubin 0.0 - 1.2 mg/dL 0.5  0.4    Alkaline Phos 38 - 126 U/L 119  117    AST 15 - 41 U/L 42  28    ALT 0 - 44 U/L 30  21       RADIOGRAPHIC STUDIES: I have personally reviewed the radiological images as listed and agreed with the findings in the report. No results found.

## 2023-10-12 NOTE — Telephone Encounter (Signed)
 Effexor  has already been refilled and needs to be denied. Metformin  has not been prescribed by you yet; okay to refill?

## 2023-10-20 ENCOUNTER — Encounter: Payer: Self-pay | Admitting: Dermatology

## 2023-10-20 ENCOUNTER — Ambulatory Visit: Admitting: Dermatology

## 2023-10-20 DIAGNOSIS — L409 Psoriasis, unspecified: Secondary | ICD-10-CM

## 2023-10-20 DIAGNOSIS — Z1283 Encounter for screening for malignant neoplasm of skin: Secondary | ICD-10-CM | POA: Diagnosis not present

## 2023-10-20 DIAGNOSIS — L821 Other seborrheic keratosis: Secondary | ICD-10-CM

## 2023-10-20 DIAGNOSIS — D1801 Hemangioma of skin and subcutaneous tissue: Secondary | ICD-10-CM

## 2023-10-20 DIAGNOSIS — L814 Other melanin hyperpigmentation: Secondary | ICD-10-CM

## 2023-10-20 DIAGNOSIS — I781 Nevus, non-neoplastic: Secondary | ICD-10-CM

## 2023-10-20 DIAGNOSIS — I8393 Asymptomatic varicose veins of bilateral lower extremities: Secondary | ICD-10-CM

## 2023-10-20 DIAGNOSIS — W908XXA Exposure to other nonionizing radiation, initial encounter: Secondary | ICD-10-CM

## 2023-10-20 DIAGNOSIS — L578 Other skin changes due to chronic exposure to nonionizing radiation: Secondary | ICD-10-CM | POA: Diagnosis not present

## 2023-10-20 DIAGNOSIS — D239 Other benign neoplasm of skin, unspecified: Secondary | ICD-10-CM

## 2023-10-20 DIAGNOSIS — I871 Compression of vein: Secondary | ICD-10-CM

## 2023-10-20 DIAGNOSIS — D229 Melanocytic nevi, unspecified: Secondary | ICD-10-CM

## 2023-10-20 DIAGNOSIS — L719 Rosacea, unspecified: Secondary | ICD-10-CM

## 2023-10-20 DIAGNOSIS — D235 Other benign neoplasm of skin of trunk: Secondary | ICD-10-CM

## 2023-10-20 DIAGNOSIS — L905 Scar conditions and fibrosis of skin: Secondary | ICD-10-CM

## 2023-10-20 NOTE — Patient Instructions (Signed)

## 2023-10-20 NOTE — Progress Notes (Signed)
 New Patient Visit   Subjective  Bianca Rogers is a 73 y.o. female who presents for the following: Skin Cancer Screening and Full Body Skin Exam  The patient presents for Total-Body Skin Exam (TBSE) for skin cancer screening and mole check. The patient has spots, moles and lesions to be evaluated, some may be new or changing and the patient may have concern these could be cancer.  Pinkness on the nose that  started as bump, other irregular appearing skin lesions on the face she would like checked today.   The following portions of the chart were reviewed this encounter and updated as appropriate: medications, allergies, medical history  Review of Systems:  No other skin or systemic complaints except as noted in HPI or Assessment and Plan.  Objective  Well appearing patient in no apparent distress; mood and affect are within normal limits.  A full examination was performed including scalp, head, eyes, ears, nose, lips, neck, chest, axillae, abdomen, back, buttocks, bilateral upper extremities, bilateral lower extremities, hands, feet, fingers, toes, fingernails, and toenails. All findings within normal limits unless otherwise noted below.   Relevant physical exam findings are noted in the Assessment and Plan.   Assessment & Plan   SKIN CANCER SCREENING PERFORMED TODAY.  ACTINIC DAMAGE - Chronic condition, secondary to cumulative UV/sun exposure - diffuse scaly erythematous macules with underlying dyspigmentation - Recommend daily broad spectrum sunscreen SPF 30+ to sun-exposed areas, reapply every 2 hours as needed.  - Staying in the shade or wearing long sleeves, sun glasses (UVA+UVB protection) and wide brim hats (4-inch brim around the entire circumference of the hat) are also recommended for sun protection.  - Call for new or changing lesions.  LENTIGINES, SEBORRHEIC KERATOSES, HEMANGIOMAS - Benign normal skin lesions - Benign-appearing - Call for any changes  MELANOCYTIC  NEVI - Tan-brown and/or pink-flesh-colored symmetric macules and papules - Benign appearing on exam today - Observation - Call clinic for new or changing moles - Recommend daily use of broad spectrum spf 30+ sunscreen to sun-exposed areas.   FAMILY HISTORY OF SKIN CANCER What type(s):  unknown Who affected: mother  DERMATOFIBROMA Exam: Firm pink/brown papulenodule with dimple sign. R chest Treatment Plan: A dermatofibroma is a benign growth possibly related to trauma, such as an insect bite, cut from shaving, or inflamed acne-type bump.  Treatment options to remove include shave or excision with resulting scar and risk of recurrence.  Since benign-appearing and not bothersome, will observe for now.    Hx of breast cancer L breast 04/2023 - partial lumpectomy and removal of 6 lymph nodes, finished radiation in April/May 2025.  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 Condition is symptomatic/ bothersome to patient. Not currently at goal.  Rosacea is a chronic progressive skin condition usually affecting the face of adults, causing redness and/or acne bumps. It is treatable but not curable. It sometimes affects the eyes (ocular rosacea) as well. It may respond to topical and/or systemic medication and can flare with stress, sun exposure, alcohol, exercise, topical steroids (including hydrocortisone/cortisone 10) and some foods.  Daily application of broad spectrum spf 30+ sunscreen to face is recommended to reduce flares.  Treatment Plan No tx needed.  PSORIASIS Exam: Hairline/scalp clear 0% BSA.  Chronic condition with duration or expected duration over one year. Currently well-controlled.  Psoriasis is a chronic non-curable, but treatable genetic/hereditary disease that may  have other systemic features affecting other organ systems such as joints (Psoriatic  Arthritis). It is associated with an increased risk of inflammatory bowel disease, heart disease, non-alcoholic fatty liver disease, and depression.  Treatments include light and laser treatments; topical medications; and systemic medications including oral and injectables.  Treatment Plan: None, pt states that PCP gave her topical cream and patch resolved.   Varicose Veins/Spider Veins - Dilated blue, purple or red veins at the lower extremities - Reassured - Smaller vessels can be treated by sclerotherapy (a procedure to inject a medicine into the veins to make them disappear) if desired, but the treatment is not covered by insurance. Larger vessels may be covered if symptomatic and we would refer to vascular surgeon if treatment desired.  SCAR - abdomen (cholecystectomy, splenectomy), L knee (knee replacement) Exam: Dyspigmented smooth macule or patch. Benign-appearing.  Observation.  Call clinic for new or changing lesions. Recommend daily broad spectrum sunscreen SPF 30+, reapply every 2 hours as needed. Treatment: Recommend Serica moisturizing scar formula cream every night or [employer] brand or Mederma silicone scar sheet every night for the first year after a scar appears to help with scar remodeling if desired. Scars remodel on their own for a full year and will gradually improve in appearance over time. MULTIPLE BENIGN NEVI   LENTIGINES   ACTINIC ELASTOSIS   SEBORRHEIC KERATOSES   CHERRY ANGIOMA   DERMATOFIBROMA   ROSACEA   PSORIASIS   SPIDER VEINS    Return in about 1 year (around 10/19/2024) for TBSE.  LILLETTE Rosina Mayans, CMA, am acting as scribe for Boneta Sharps, MD .   Documentation: I have reviewed the above documentation for accuracy and completeness, and I agree with the above.  Boneta Sharps, MD

## 2023-10-23 ENCOUNTER — Other Ambulatory Visit

## 2023-10-23 ENCOUNTER — Ambulatory Visit: Admitting: Oncology

## 2023-10-26 ENCOUNTER — Encounter: Payer: Self-pay | Admitting: Oncology

## 2023-10-27 ENCOUNTER — Other Ambulatory Visit: Payer: Self-pay | Admitting: Student

## 2023-10-27 DIAGNOSIS — R296 Repeated falls: Secondary | ICD-10-CM

## 2023-10-29 ENCOUNTER — Other Ambulatory Visit (HOSPITAL_COMMUNITY): Payer: Self-pay

## 2023-11-02 ENCOUNTER — Encounter: Payer: Self-pay | Admitting: Neurology

## 2023-11-04 ENCOUNTER — Encounter: Payer: Self-pay | Admitting: Student

## 2023-11-05 ENCOUNTER — Telehealth: Payer: Self-pay

## 2023-11-05 ENCOUNTER — Encounter: Payer: Self-pay | Admitting: Student

## 2023-11-05 ENCOUNTER — Ambulatory Visit: Attending: Neurology

## 2023-11-05 DIAGNOSIS — R2689 Other abnormalities of gait and mobility: Secondary | ICD-10-CM | POA: Diagnosis present

## 2023-11-05 DIAGNOSIS — R262 Difficulty in walking, not elsewhere classified: Secondary | ICD-10-CM | POA: Insufficient documentation

## 2023-11-05 DIAGNOSIS — M6281 Muscle weakness (generalized): Secondary | ICD-10-CM | POA: Diagnosis present

## 2023-11-05 NOTE — Therapy (Signed)
 OUTPATIENT PHYSICAL THERAPY NEURO EVALUATION   Patient Name: Bianca Rogers MRN: [id number] DOB:April 28, 1950, 73 y.o., female Today's Date: 11/05/2023   PCP: Vincente Shivers, NP  REFERRING PROVIDER: Lane Arthea BRAVO, MD   END OF SESSION:  PT End of Session - 11/05/23 9147     Visit Number 1    Number of Visits 25    Date for PT Re-Evaluation 01/28/24    PT Start Time 0848    PT Stop Time 0930    PT Time Calculation (min) 42 min          Past Medical History:  Diagnosis Date   Anal fissure 12/09/2016   Added automatically from request for surgery [id number]    Added automatically from request for surgery [id number]     Arthralgia of left knee 12/03/2020   Asthma 08/29/2022   Chronic/stable.    Follows with Dr. Elayne, pulmonology.   Using Flovent  and albuterol  inhaler as needed.     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     Heart murmur    History of total knee arthroplasty 08/21/2021   Hyperlipidemia 08/29/2022   Lab Results   Component Value Date    Cholesterol 197 01/28/2022      Lab Results   Component Value Date    HDL 56 01/28/2022      Lab Results   Component Value Date    LDL Calculated 115 01/28/2022      Lab Results   Component Value Date    Triglycerides 131 01/28/2022      Lab Results   Component Value Date    Chol/HDL Ratio 3.5 01/28/2022      Criteria used to determine recommendation include:    Hypertension    Invasive carcinoma of breast (HCC)    Liver cirrhosis secondary to NASH (HCC)    Lung nodule    Major depressive disorder in full remission (HCC) 04/02/2020   Mood stable on venlafaxine  150 mg daily.      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 breast of female, estrogen receptor positive (HCC)    Migraine 08/29/2022   NAFLD (nonalcoholic fatty liver disease) 92/76/7985   Non-alcoholic cirrhosis (HCC)    Osteoarthritis of left knee 09/03/2020   Peritoneal hematoma 07/07/2019   Sprain of MCL (medial collateral ligament) of knee 09/03/2020   Symptom associated with female genital organs 07/09/2010   Past Surgical History:  Procedure Laterality Date   ABDOMINAL HYSTERECTOMY     AXILLARY SENTINEL NODE BIOPSY Left 04/22/2023   Procedure: AXILLARY SENTINEL NODE BIOPSY;  Surgeon: Lane Shope, MD;  Location: [facility];  Service: General;  Laterality: Left;   BREAST BIOPSY Left 03/31/2023   Us  Core bx, coil clip - path pending   BREAST BIOPSY Left 03/31/2023   US  LT BREAST BX W LOC DEV 1ST LESION IMG BX SPEC US  GUIDE 03/31/2023 [facility]   BREAST BIOPSY Left 04/20/2023   US  LT RADIO FREQUENCY TAG LOC US  GUIDE 04/20/2023 [facility]   BREAST LUMPECTOMY WITH RADIOFREQUENCY TAG IDENTIFICATION Left 04/22/2023   Procedure: BREAST LUMPECTOMY WITH RADIOFREQUENCY TAG IDENTIFICATION;  Surgeon: Lane Shope, MD;  Location: [facility];  Service: General;  Laterality: Left;   CARDIAC CATHETERIZATION  03/10/2012   Negative   CATARACT EXTRACTION Bilateral    CESAREAN SECTION     CHOLECYSTECTOMY     RE-EXCISION OF BREAST LUMPECTOMY Left 05/20/2023   Procedure: EXCISION, LESION, BREAST, REPEAT;  Surgeon: Lane Shope, MD;  Location: [facility];  Service: General;  Laterality: Left;   SPLENECTOMY     TONSILLECTOMY     Patient Active Problem List   Diagnosis Date Noted   Chronic nonintractable headache 09/08/2023   Osteopenia 05/27/2023   Genetic testing 05/15/2023   Change in facial mole 04/17/2023   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 breast of female, estrogen receptor positive (HCC) 04/09/2023   Invasive carcinoma of breast  (HCC) 04/08/2023   Family history of breast cancer 04/08/2023   Gastroesophageal reflux disease 03/02/2023   Liver cirrhosis secondary to NASH (HCC) 03/02/2023   Bronchiectasis (HCC) 06/09/2013   Type 2 diabetes mellitus with diabetic neuropathy (HCC) 06/09/2013   HTN (hypertension), benign 06/09/2013    ONSET DATE: 10/09/23  REFERRING DIAG: R29.6 (ICD-10-CM) - Frequent falls   THERAPY DIAG:  Imbalance - Plan: PT plan of care cert/re-cert  Difficulty in walking, not elsewhere classified - Plan: PT plan of care cert/re-cert  Muscle weakness (generalized) - Plan: PT plan of care cert/re-cert  Rationale for Evaluation and Treatment: Rehabilitation  SUBJECTIVE:                                                                                                                                                                                             SUBJECTIVE STATEMENT:  Pt reports she has had PT in [place] before for her neck and after her L TKA.  Pt reports that she has been having neck pain and headaches to the point where she will be having an MRI tomorrow.  Pt reports having 2 falls (August 2nd, August 6th).  Pt notes that the first fall she was trying to get out of the recliner and she fell forward.  Pt reports on the second fall, she missed a step.    Pt reports she feels like she has POTS, and that it runs in her family as well.    Pt had gabapentin  increased from 1 time in the morning and 1 time at night, to 2 at each time frame, and then recently switched to 3 in the morning and 3 at night.  Pt reports she was told by MD to ambulate with a walker, however she has not been doing that.     Pt  accompanied by: self  PERTINENT HISTORY: PMH: asthma, CAD, DM2, heart murmur, L TKA, HTN, Depressive disorder,migraines  PAIN:  Are you having pain? Yes: NPRS scale: 4/10 Pain location: R lateral head Pain description: Just pain Aggravating factors: None Relieving factors: Tylenol   and Ibuprofen , but was taken off of it because she was overdosing on it.    PRECAUTIONS: Pt has hx of breast cancer and finished radiation back in May.    RED FLAGS: None   WEIGHT BEARING RESTRICTIONS: No  FALLS: Has patient fallen in last 6 months? Yes. Number of falls 2  LIVING ENVIRONMENT: Lives with: mother lives with pt Lives in: Apartment Stairs: Elevator Has following equipment at home: Single point cane and Environmental consultant - 2 wheeled  PLOF: Independent  PATIENT GOALS: Being less wobbly and feel more balances.  OBJECTIVE:  Note: Objective measures were completed at Evaluation unless otherwise noted.  DIAGNOSTIC FINDINGS: Pt to have an MRI tomorrow.    COGNITION: Overall cognitive status: Within functional limits for tasks assessed   SENSATION: WFL, how pt notes burning in the bottom of her feet.  Pt reports she has neuropathy in the feet.  COORDINATION: Not formally tested  LOWER EXTREMITY ROM:     Active  Right Eval Left Eval  Hip flexion    Hip extension    Hip abduction    Hip adduction    Hip internal rotation    Hip external rotation    Knee flexion    Knee extension    Ankle dorsiflexion    Ankle plantarflexion    Ankle inversion    Ankle eversion     (Blank rows = not tested)  LOWER EXTREMITY MMT:    MMT Right Eval Left Eval  Hip flexion    Hip extension    Hip abduction    Hip adduction    Hip internal rotation    Hip external rotation    Knee flexion    Knee extension    Ankle dorsiflexion    Ankle plantarflexion    Ankle inversion    Ankle eversion    (Blank rows = not tested)  ORTHOSTATICS:  Supine - BP: 131/52  mmHG  Sitting - BP: 112/55 mmHg  Standing - BP: 96/53 mmHg  Standing +3 Min - BP: 133/70 mmHg   FUNCTIONAL TESTS:  5 times sit to stand: 19.04 sec Timed up and go (TUG): 14.68 sec 6 minute walk test: TBD 10 meter walk test: 15.32 sec; 0.65 m/s Dynamic Gait Index   PATIENT SURVEYS:  ABC Scale:  TBD                                                                                                                                 TREATMENT DATE: 11/05/23  Evaluation  Self-Care/Home Management:  Pt introduced to orthostatic findings and educated on that potentially being the cause of the dizziness that she experiences when she changes positions.  Therapist also messaged via secure chat and created a telephone order/documentation putting the vitals in the chart for the NP to assess and discuss with pt.  NP appreciated the information.  Pt also educated on roles of therapy and the prognosis based on the symptomatic response.     PATIENT EDUCATION: Education details: Pt educated on role of PT and services provided during current POC, along with prognosis and information about the clinic.  Person educated: Patient Education method: Explanation Education comprehension: verbalized understanding  HOME EXERCISE PROGRAM: TBD at subsequent visit  GOALS: Goals reviewed with patient? Yes  SHORT TERM GOALS: Target date: 12/03/2023   Pt will be independent with HEP in order to demonstrate increased ability to perform tasks related to occupation/hobbies. Baseline: to be given at subsequent visit Goal status: INITIAL   LONG TERM GOALS: Target date: 01/28/2024  1.  Patient (> 31 years old) will complete five times sit to stand test in < 15 seconds indicating an increased LE strength and improved balance. Baseline: 19.04 sec Goal status: INITIAL  2.  Pt will improve ABC by at least 13% in order to demonstrate clinically significant improvement in balance confidence.  Baseline: TBD Goal status: INITIAL   3.  Pt will improve DGI by at least 3 points in order to demonstrate clinically significant improvement in balance and decreased risk for falls. Baseline: TBD Goal status: INITIAL   4.  Patient will reduce timed up and go to <11 seconds to reduce fall risk and demonstrate improved transfer/gait  ability. Baseline: 14.68 sec Goal status: INITIAL  5.  Patient will increase 10 meter walk test to >1.23m/s as to improve gait speed for better community ambulation and to reduce fall risk. Baseline: 15.32 sec; 0.65 m/s Goal status: INITIAL  6.  Patient will increase six minute walk test distance to >1000 for progression to community ambulator and improve gait ability Baseline: TBD Goal status: INITIAL   ASSESSMENT:  CLINICAL IMPRESSION: Patient is a 74 y.o. female who was seen today for physical therapy evaluation and treatment for imbalance/frequent falls.  Pt presents with orthostatic values that are consistent with orthostatic hypotension as noted above.  NP notified of the values via secure chat and will continue to monitor and discuss with pt.  Pt's symptoms likely to be linked with this, however further assessment will be made to clarify.  Pt will benefit from skilled therapy to address tolerance, balance, and strength impairments necessary for improvement in quality of life.  Pt. demonstrates understanding of this plan of care and agrees with this plan.    OBJECTIVE IMPAIRMENTS: Abnormal gait, decreased balance, decreased mobility, difficulty walking, dizziness, and improper body mechanics.   ACTIVITY LIMITATIONS: lifting, sitting, standing, stairs, transfers, reach over head, and locomotion level  PARTICIPATION LIMITATIONS: meal prep, cleaning, laundry, driving, shopping, community activity, and yard work  PERSONAL FACTORS: Age, Time since onset of injury/illness/exacerbation, and 3+ comorbidities: asthma, CAD, DM2, heart murmur, L TKA, HTN, Depressive disorder,migraines are also affecting patient's functional outcome.   REHAB POTENTIAL: Good  CLINICAL DECISION MAKING: Evolving/moderate complexity  EVALUATION COMPLEXITY: Moderate  PLAN:  PT FREQUENCY: 2x/week  PT DURATION: 12 weeks  PLANNED INTERVENTIONS: 97750- Physical Performance Testing, 97110-Therapeutic exercises,  97530- Therapeutic activity, [id number]- Neuromuscular re-education, 97535- Self Care, [id number]- Manual therapy, [id number]- Gait training, [phone]- Canalith repositioning, Balance training, and Stair training  PLAN FOR NEXT SESSION:   Check orthostatics again, however include HR this go bout Check MMT ABC, , DGI    Fonda Simpers,  PT, DPT Physical Therapist - [facility]  11/05/23, 6:09 PM

## 2023-11-05 NOTE — Telephone Encounter (Signed)
 Orthostatics for Bianca Rogers:   Supine - BP: 131/52  mmHG  Sitting - BP: 112/55 mmHg  Standing - BP: 96/53 mmHg  Standing +3 Min - BP: 133/70 mmHg

## 2023-11-07 ENCOUNTER — Ambulatory Visit
Admission: RE | Admit: 2023-11-07 | Discharge: 2023-11-07 | Disposition: A | Discharge status: Home / Self Care | Source: Ambulatory Visit | Attending: Student

## 2023-11-07 DIAGNOSIS — R296 Repeated falls: Secondary | ICD-10-CM

## 2023-11-10 ENCOUNTER — Ambulatory Visit: Attending: Internal Medicine

## 2023-11-10 ENCOUNTER — Other Ambulatory Visit: Payer: Self-pay | Admitting: General Practice

## 2023-11-10 DIAGNOSIS — G4733 Obstructive sleep apnea (adult) (pediatric): Secondary | ICD-10-CM | POA: Insufficient documentation

## 2023-11-10 DIAGNOSIS — R0681 Apnea, not elsewhere classified: Secondary | ICD-10-CM | POA: Diagnosis present

## 2023-11-10 DIAGNOSIS — K219 Gastro-esophageal reflux disease without esophagitis: Secondary | ICD-10-CM

## 2023-11-11 ENCOUNTER — Encounter: Payer: Self-pay | Admitting: General Practice

## 2023-11-11 ENCOUNTER — Ambulatory Visit: Payer: Self-pay | Admitting: General Practice

## 2023-11-11 ENCOUNTER — Ambulatory Visit (INDEPENDENT_AMBULATORY_CARE_PROVIDER_SITE_OTHER)
Admission: RE | Admit: 2023-11-11 | Discharge: 2023-11-11 | Disposition: A | Discharge status: Home / Self Care | Source: Ambulatory Visit | Attending: General Practice | Admitting: General Practice

## 2023-11-11 ENCOUNTER — Ambulatory Visit (INDEPENDENT_AMBULATORY_CARE_PROVIDER_SITE_OTHER): Admitting: General Practice

## 2023-11-11 VITALS — BP 124/50 | HR 81 | Temp 97.6°F | Ht 64.0 in | Wt 146.6 lb

## 2023-11-11 DIAGNOSIS — M25551 Pain in right hip: Secondary | ICD-10-CM

## 2023-11-11 DIAGNOSIS — I10 Essential (primary) hypertension: Secondary | ICD-10-CM

## 2023-11-11 DIAGNOSIS — E1140 Type 2 diabetes mellitus with diabetic neuropathy, unspecified: Secondary | ICD-10-CM | POA: Diagnosis not present

## 2023-11-11 DIAGNOSIS — R102 Pelvic and perineal pain: Secondary | ICD-10-CM

## 2023-11-11 DIAGNOSIS — R0789 Other chest pain: Secondary | ICD-10-CM

## 2023-11-11 DIAGNOSIS — R42 Dizziness and giddiness: Secondary | ICD-10-CM | POA: Diagnosis not present

## 2023-11-11 LAB — BASIC METABOLIC PANEL WITH GFR
BUN: 26 mg/dL — ABNORMAL HIGH (ref 6–23)
CO2: 26 meq/L (ref 19–32)
Calcium: 9.5 mg/dL (ref 8.4–10.5)
Chloride: 101 meq/L (ref 96–112)
Creatinine, Ser: 1.14 mg/dL (ref 0.40–1.20)
GFR: 47.61 mL/min — ABNORMAL LOW (ref >60.00)
Glucose, Bld: 101 mg/dL — ABNORMAL HIGH (ref 70–99)
Potassium: 4.1 meq/L (ref 3.5–5.1)
Sodium: 137 meq/L (ref 135–145)

## 2023-11-11 LAB — CBC
HCT: 34.5 % — ABNORMAL LOW (ref 36.0–46.0)
Hemoglobin: 11.4 g/dL — ABNORMAL LOW (ref 12.0–15.0)
MCHC: 33.1 g/dL (ref 30.0–36.0)
MCV: 88.7 fl (ref 78.0–100.0)
Platelets: 331 K/uL (ref 150.0–400.0)
RBC: 3.89 Mil/uL (ref 3.87–5.11)
RDW: 14 % (ref 11.5–15.5)
WBC: 9 K/uL (ref 4.0–10.5)

## 2023-11-11 MED ORDER — LISINOPRIL 20 MG PO TABS: 20.0000 mg | ORAL_TABLET | Freq: Every day | ORAL | 0 refills | Status: DC

## 2023-11-11 NOTE — Patient Instructions (Addendum)
 Stop by the lab prior to leaving today. I will notify you of your results once received.  Complete xray(s) prior to leaving today. I will notify you of your results once received.  Discontinue Lisinopril -hydrochlorothiazide.  Start Lisinopril  20 mg once daily.  Start monitoring your blood pressure daily, around the same time of day, for the next 2-3 weeks.  Ensure that you have rested for 30 minutes prior to checking your blood pressure.   Record your readings and notify me if you see numbers consistently at or above 130 on top and/or 90 on bottom.  Please make sure you are drinking plenty of water  and staying hydrated.   You will either be contacted via phone regarding your referral to cardiology, or you may receive a letter on your MyChart portal from our referral team with instructions for scheduling an appointment. Please let us  know if you have not been contacted by anyone within two weeks.  I will be in touch about the x-ray results.   Follow up in 2 weeks for blood pressure.   It was a pleasure to see you today!

## 2023-11-11 NOTE — Progress Notes (Signed)
 Established Patient Office Visit  Subjective   Patient ID: Bianca Rogers, female    DOB: [birth year]  Age: 73 y.o. MRN: [id number]  Chief Complaint  Patient presents with   Dizziness    Imbalanced and headaches x years but getting worse. Patient had MRI done Saturday to see what's going on.     Dizziness Pertinent negatives include no abdominal pain, chest pain, chills, fever, headaches, nausea or vomiting.   Bianca Rogers is a 73 year old female with past medical history of HTN, GERD, Bronchiectasis, liver cirrhosis secondary to NASH, type 2 DM, osteopenia, psoriasis, breast cancer presents today for an acute visit.   Discussed the use of AI scribe software for clinical note transcription with the patient, who gave verbal consent to proceed.  History of Present Illness She has been experiencing dizziness and imbalance for several months, which has not improved despite recent changes in medication. The dizziness has not worsened since increasing her gabapentin  dose about a week ago. She occasionally uses a cane due to feeling wobbly and has almost fallen at home. She finds using a cane or walker uncomfortable.  She has a history of hypertension and is currently managed on Lisinopril -hydrochlorothiazide 20-12.5 mg once daily. During a recent physical therapy session, she was found to have positive orthostatic vital signs. Her BP drops while standing up from sitting position. She has had ongoing dizziness and imbalance now for several months but she reports that it has gotten worse.  Recently her Gabapentin  was increased but she reports that the dose change did not affect her dizziness.  An MRI of her head was performed on 11/07/23, which showed mild to moderate age-related changes in the pons. There was no evidence of metastatic disease or acute abnormality.  She reports right lower quadrant pain for the past three to four months, described as sharp and occurring intermittently. The pain  worsens with walking and sometimes when leaning against something. She has been taking Tylenol  and ibuprofen  with minimal relief. She also experiences pain in her right hip and has difficulty walking long distances. She reports constipation, with bowel movements occurring daily most of the time, but occasionally going a week without a bowel movement. No urinary symptoms, blood in urine, or abnormal vaginal bleeding.  She mentions chest pain on the left side, which comes and goes and is not related to movement or food intake. This has been occurring for a few months. She has a history of breast cancer on the left side and has undergone surgery twice. She denies any chest pain today. Her last EKG was in February, 2025, which was within normal limits. She discussed this with her oncologist who recommended an evaluation with the cardiologist.     Patient Active Problem List   Diagnosis Date Noted   Chronic nonintractable headache 09/08/2023   Osteopenia 05/27/2023   Genetic testing 05/15/2023   Change in facial mole 04/17/2023   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 breast of female, estrogen receptor positive (HCC) 04/09/2023   Invasive carcinoma of breast (HCC) 04/08/2023   Family history of breast cancer 04/08/2023   Gastroesophageal reflux disease 03/02/2023   Liver cirrhosis secondary to NASH (HCC) 03/02/2023   Bronchiectasis (HCC) 06/09/2013   Type 2 diabetes mellitus with diabetic neuropathy (HCC) 06/09/2013   HTN (hypertension), benign 06/09/2013   Past Medical History:  Diagnosis Date   Anal fissure 12/09/2016   Added automatically from request for surgery [id number]  Added automatically from request for surgery [id number]     Arthralgia of left knee 12/03/2020   Asthma 08/29/2022   Chronic/stable.    Follows with Dr. Elayne, pulmonology.   Using Flovent  and albuterol  inhaler as needed.     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     Heart murmur    History of total knee arthroplasty 08/21/2021   Hyperlipidemia 08/29/2022   Lab Results   Component Value Date    Cholesterol 197 01/28/2022      Lab Results   Component Value Date    HDL 56 01/28/2022      Lab Results   Component Value Date    LDL Calculated 115 01/28/2022      Lab Results   Component Value Date    Triglycerides 131 01/28/2022      Lab Results   Component Value Date    Chol/HDL Ratio 3.5 01/28/2022      Criteria used to determine recommendation include:    Hypertension    Invasive carcinoma of breast (HCC)    Liver cirrhosis secondary to NASH (HCC)    Lung nodule    Major depressive disorder in full remission (HCC) 04/02/2020   Mood stable on venlafaxine  150 mg daily.     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 breast of female, estrogen receptor positive (HCC)    Migraine 08/29/2022   NAFLD (nonalcoholic fatty liver disease) 92/76/7985   Non-alcoholic cirrhosis (HCC)    Osteoarthritis of left knee 09/03/2020   Peritoneal hematoma 07/07/2019   Sprain of MCL (medial collateral ligament) of knee 09/03/2020   Symptom associated with female genital organs 07/09/2010   Past Surgical History:  Procedure Laterality Date   ABDOMINAL HYSTERECTOMY     AXILLARY SENTINEL NODE BIOPSY Left 04/22/2023   Procedure: AXILLARY SENTINEL NODE BIOPSY;  Surgeon: Lane Shope, MD;  Location: [facility];  Service: General;  Laterality: Left;   BREAST BIOPSY Left 03/31/2023   Us  Core bx, coil clip - path pending   BREAST BIOPSY Left 03/31/2023   US  LT BREAST BX W LOC DEV 1ST LESION IMG BX SPEC US  GUIDE 03/31/2023  [facility]   BREAST BIOPSY Left 04/20/2023   US  LT RADIO FREQUENCY TAG LOC US  GUIDE 04/20/2023 [facility]   BREAST LUMPECTOMY WITH RADIOFREQUENCY TAG IDENTIFICATION Left 04/22/2023   Procedure: BREAST LUMPECTOMY WITH RADIOFREQUENCY TAG IDENTIFICATION;  Surgeon: Lane Shope, MD;  Location: [facility];  Service: General;  Laterality: Left;   CARDIAC CATHETERIZATION  03/10/2012   Negative   CATARACT EXTRACTION Bilateral    CESAREAN SECTION     CHOLECYSTECTOMY     RE-EXCISION OF BREAST LUMPECTOMY Left 05/20/2023   Procedure: EXCISION, LESION, BREAST, REPEAT;  Surgeon: Lane Shope, MD;  Location: [facility];  Service: General;  Laterality: Left;   SPLENECTOMY     TONSILLECTOMY     Allergies  Allergen Reactions   Penicillins Rash    Other reaction(s): unknown reaction   Zoloft [Sertraline Hcl] Other (See Comments)    Paradoxical response   Codeine Other (See Comments) and Anxiety    Irritable, insomnia   Sertraline Anxiety  11/11/2023   11:07 AM 08/20/2023    9:12 AM 06/09/2023   11:55 AM  Depression screen PHQ 2/9  Decreased Interest 0 2 0  Down, Depressed, Hopeless 0 0 1  PHQ - 2 Score 0 2 1  Altered sleeping 2 1 2   Tired, decreased energy 3 1 2   Change in appetite 1 0 1  Feeling bad or failure about yourself  0 0 0  Trouble concentrating 2 1 1   Moving slowly or fidgety/restless 2 0 0  Suicidal thoughts 0 0 0  PHQ-9 Score 10 5 7   Difficult doing work/chores Somewhat difficult Not difficult at all Not difficult at all       11/11/2023   11:07 AM 06/09/2023   12:02 PM 04/17/2023    2:51 PM 03/02/2023    9:46 AM  GAD 7 : Generalized Anxiety Score  Nervous, Anxious, on Edge 2 2 2  0  Control/stop worrying 1 1 1 1   Worry too much - different things 1 1 1 1   Trouble relaxing 1 1 1 1   Restless 0 1 1 0  Easily annoyed or irritable 3 1 2 1   Afraid - awful might happen 0 1 0 0  Total GAD 7 Score 8 8 8 4   Anxiety Difficulty Somewhat difficult Not difficult at  all Not difficult at all Somewhat difficult      Review of Systems  Constitutional:  Negative for chills and fever.  Respiratory:  Negative for shortness of breath.   Cardiovascular:  Negative for chest pain and leg swelling.  Gastrointestinal:  Negative for abdominal pain, constipation, diarrhea, heartburn, nausea and vomiting.  Genitourinary:  Negative for dysuria, frequency and urgency.  Musculoskeletal:  Positive for joint pain.  Neurological:  Positive for dizziness. Negative for headaches.  Endo/Heme/Allergies:  Negative for polydipsia.  Psychiatric/Behavioral:  Negative for depression and suicidal ideas. The patient is not nervous/anxious.       Objective:     BP (!) 124/50   Pulse 81   Temp 97.6 F (36.4 C) (Temporal)   Ht 5' 4 (1.626 m)   Wt 146 lb 9.6 oz (66.5 kg)   SpO2 98%   BMI 25.16 kg/m  BP Readings from Last 3 Encounters:  11/11/23 (!) 124/50  10/12/23 137/66  09/08/23 118/60   Wt Readings from Last 3 Encounters:  11/11/23 146 lb 9.6 oz (66.5 kg)  10/12/23 151 lb 6.4 oz (68.7 kg)  09/08/23 153 lb (69.4 kg)      Physical Exam Vitals and nursing note reviewed.  Constitutional:      Appearance: Normal appearance.  Cardiovascular:     Rate and Rhythm: Normal rate and regular rhythm.     Pulses: Normal pulses.     Heart sounds: Normal heart sounds.  Pulmonary:     Effort: Pulmonary effort is normal.     Breath sounds: Normal breath sounds.  Abdominal:     General: Bowel sounds are normal.     Palpations: Abdomen is soft.     Tenderness: There is abdominal tenderness.     Hernia: No hernia is present.   Musculoskeletal:     Right hip: No tenderness or bony tenderness. Decreased range of motion.     Left hip: No tenderness. Normal range of motion.  Neurological:     Mental Status: She is alert and oriented to person, place, and time.  Psychiatric:        Mood and Affect: Mood normal.  Behavior: Behavior normal.        Thought Content:  Thought content normal.        Judgment: Judgment normal.      No results found for any visits on 11/11/23.     The 10-year ASCVD risk score (Arnett DK, et al., 2019) is: 28.5%    Assessment & Plan:  Dizziness -     Ambulatory referral to Cardiology -     CBC -     Basic metabolic panel with GFR  Type 2 diabetes mellitus with diabetic neuropathy, unspecified whether long term insulin  use (HCC)  Atypical chest pain -     Ambulatory referral to Cardiology  Acute pelvic pain -     DG HIP UNILAT W OR W/O PELVIS 2-3 VIEWS RIGHT  Acute right hip pain -     DG HIP UNILAT W OR W/O PELVIS 2-3 VIEWS RIGHT  HTN (hypertension), benign -     Lisinopril ; Take 1 tablet (20 mg total) by mouth daily.  Dispense: 30 tablet; Refill: 0    Assessment and Plan Assessment & Plan Dizziness and imbalance Chronic dizziness and imbalance, possibly due to hypotension or medication side effects. MRI shows mild to moderate age-related changes, likely from chronic small vessel disease. Neurology involved, and physical therapy planned. Near falls are concerning. - Refer to cardiology for evaluation of dizziness and hypotension. - Discontinue lisinopril -hydrochlorothiazide and start lisinopril  only. - Instruct to monitor blood pressure at home and follow up in two weeks. - Check kidney function today to assess for dehydration. - Ensure adequate hydration.  Essential hypertension Blood pressure is low, possibly contributing to dizziness.  - reviewed BMP from 10/10/23 which showed AKI. Suspect this could be causing the worse dizziness. - Discontinue lisinopril -hydrochlorothiazide.  - Start lisinopril  20 mg once daily. Rx sent. - Monitor blood pressure at home and follow up in two weeks. - Ensure adequate hydration.  Right hip pain and pelvic pain Right lower quadrant and right hip pain for 3-4 months, possibly related to a cyst on the right kidney. Pain is intermittent and sharp. Further evaluation  needed due to cancer history. - Order x-ray of pelvis and hip to evaluate for hernia or other causes of pain. - Review previous imaging for cyst on right kidney. - Check labs today for further evaluation.  Atypical chest pain Intermittent chest pain. No pain present today.  - no red flags.  - labs pending. - Refer to cardiology for further evaluation of chest pain.   Return in about 2 weeks (around 11/25/2023) for blood pressure.    Carrol Aurora, NP

## 2023-11-18 ENCOUNTER — Ambulatory Visit: Attending: Neurology

## 2023-11-18 ENCOUNTER — Other Ambulatory Visit: Payer: Self-pay | Admitting: General Practice

## 2023-11-18 DIAGNOSIS — R262 Difficulty in walking, not elsewhere classified: Secondary | ICD-10-CM | POA: Insufficient documentation

## 2023-11-18 DIAGNOSIS — M6281 Muscle weakness (generalized): Secondary | ICD-10-CM | POA: Insufficient documentation

## 2023-11-18 DIAGNOSIS — E1140 Type 2 diabetes mellitus with diabetic neuropathy, unspecified: Secondary | ICD-10-CM

## 2023-11-18 DIAGNOSIS — R2689 Other abnormalities of gait and mobility: Secondary | ICD-10-CM | POA: Diagnosis present

## 2023-11-18 DIAGNOSIS — R293 Abnormal posture: Secondary | ICD-10-CM | POA: Diagnosis present

## 2023-11-18 NOTE — Telephone Encounter (Signed)
 FYI Only or Action Required?: Action required by provider: medication refill request.  Patient was last seen in primary care on 11/11/2023 by Vincente Shivers, NP.  Called Nurse Triage reporting No chief complaint on file..  Symptoms began today.  Interventions attempted: Nothing.  Symptoms are: stable.  Triage Disposition: No disposition on file.  Patient/caregiver understands and will follow disposition?:

## 2023-11-18 NOTE — Telephone Encounter (Unsigned)
 Copied from CRM [id number]. Topic: Clinical - Medication Refill >> Nov 18, 2023 12:06 PM Thersia C wrote: Medication: gabapentin  (NEURONTIN ) 100 MG capsule  Has the patient contacted their pharmacy? Yes (Agent: If no, request that the patient contact the pharmacy for the refill. If patient does not wish to contact the pharmacy document the reason why and proceed with request.) (Agent: If yes, when and what did the pharmacy advise?)  This is the patient's preferred pharmacy:   [facility] - [place], [place] - [address] [place] [address] Phone: [phone] Fax: [phone]  Is this the correct pharmacy for this prescription? Yes If no, delete pharmacy and type the correct one.   Has the prescription been filled recently? No  Is the patient out of the medication? Yes  Has the patient been seen for an appointment in the last year OR does the patient have an upcoming appointment? Yes  Can we respond through MyChart? Yes  Agent: Please be advised that Rx refills may take up to 3 business days. We ask that you follow-up with your pharmacy.

## 2023-11-18 NOTE — Therapy (Signed)
 OUTPATIENT PHYSICAL THERAPY NEURO    Patient Name: Bianca Rogers MRN: [id number] DOB:29-Apr-1950, 73 y.o., female Today's Date: 11/18/2023  PCP: Vincente Shivers, NP  REFERRING PROVIDER: Lane Arthea BRAVO, MD   END OF SESSION:  PT End of Session - 11/18/23 0852     Visit Number 2    Number of Visits 25    Date for PT Re-Evaluation 01/28/24    Authorization Type humana medicare    Authorization Time Period 11/05/23-02/07/24 24 visits    Authorization - Visit Number 2    Authorization - Number of Visits 24    Progress Note Due on Visit 10    PT Start Time 0845    PT Stop Time 0925    PT Time Calculation (min) 40 min    Equipment Utilized During Treatment Gait belt    Activity Tolerance Patient tolerated treatment well;No increased pain    Behavior During Therapy [facility] for tasks assessed/performed          Past Medical History:  Diagnosis Date   Anal fissure 12/09/2016   Added automatically from request for surgery [id number]    Added automatically from request for surgery [id number]     Arthralgia of left knee 12/03/2020   Asthma 08/29/2022   Chronic/stable.    Follows with Dr. Elayne, pulmonology.   Using Flovent  and albuterol  inhaler as needed.     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     Heart murmur    History of total knee arthroplasty 08/21/2021   Hyperlipidemia 08/29/2022   Lab Results   Component Value Date    Cholesterol 197 01/28/2022      Lab Results   Component Value Date    HDL 56 01/28/2022      Lab Results   Component Value Date    LDL Calculated 115 01/28/2022      Lab Results   Component Value Date     Triglycerides 131 01/28/2022      Lab Results   Component Value Date    Chol/HDL Ratio 3.5 01/28/2022      Criteria used to determine recommendation include:    Hypertension    Invasive carcinoma of breast (HCC)    Liver cirrhosis secondary to NASH (HCC)    Lung nodule    Major depressive disorder in full remission (HCC) 04/02/2020   Mood stable on venlafaxine  150 mg daily.     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 breast of female, estrogen receptor positive (HCC)    Migraine 08/29/2022   NAFLD (nonalcoholic fatty liver disease) 92/76/7985   Non-alcoholic cirrhosis (HCC)    Osteoarthritis of left knee 09/03/2020   Peritoneal hematoma 07/07/2019   Sprain of MCL (medial collateral ligament) of knee 09/03/2020   Symptom associated with female genital organs 07/09/2010   Past Surgical History:  Procedure Laterality Date   ABDOMINAL HYSTERECTOMY     AXILLARY SENTINEL NODE BIOPSY Left 04/22/2023   Procedure: AXILLARY SENTINEL NODE BIOPSY;  Surgeon: Lane Shope, MD;  Location: [facility] ORS;  Service: General;  Laterality: Left;   BREAST BIOPSY Left 03/31/2023   Us  Core bx, coil  clip - path pending   BREAST BIOPSY Left 03/31/2023   US  LT BREAST BX W LOC DEV 1ST LESION IMG BX SPEC US  GUIDE 03/31/2023 [facility]   BREAST BIOPSY Left 04/20/2023   US  LT RADIO FREQUENCY TAG LOC US  GUIDE 04/20/2023 [facility]   BREAST LUMPECTOMY WITH RADIOFREQUENCY TAG IDENTIFICATION Left 04/22/2023   Procedure: BREAST LUMPECTOMY WITH RADIOFREQUENCY TAG IDENTIFICATION;  Surgeon: Lane Shope, MD;  Location: [facility] ORS;  Service: General;  Laterality: Left;   CARDIAC CATHETERIZATION  03/10/2012   Negative   CATARACT EXTRACTION Bilateral    CESAREAN SECTION     CHOLECYSTECTOMY     RE-EXCISION OF BREAST LUMPECTOMY Left 05/20/2023   Procedure: EXCISION, LESION, BREAST, REPEAT;  Surgeon: Lane Shope, MD;  Location: [facility] ORS;  Service: General;  Laterality: Left;   SPLENECTOMY      TONSILLECTOMY     Patient Active Problem List   Diagnosis Date Noted   Chronic nonintractable headache 09/08/2023   Osteopenia 05/27/2023   Genetic testing 05/15/2023   Change in facial mole 04/17/2023   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 breast of female, estrogen receptor positive (HCC) 04/09/2023   Invasive carcinoma of breast (HCC) 04/08/2023   Family history of breast cancer 04/08/2023   Gastroesophageal reflux disease 03/02/2023   Liver cirrhosis secondary to NASH (HCC) 03/02/2023   Bronchiectasis (HCC) 06/09/2013   Type 2 diabetes mellitus with diabetic neuropathy (HCC) 06/09/2013   HTN (hypertension), benign 06/09/2013    ONSET DATE: 10/09/23  REFERRING DIAG: R29.6 (ICD-10-CM) - Frequent falls   THERAPY DIAG:  Imbalance  Difficulty in walking, not elsewhere classified  Muscle weakness (generalized)  Abnormal posture  Rationale for Evaluation and Treatment: Rehabilitation  SUBJECTIVE:                                                                                                                                                                                             SUBJECTIVE STATEMENT: Doing fine today. Headache as usual.   Pt accompanied by: self  PERTINENT HISTORY:   Pt reports she has had PT in [place] before for her neck and after her L TKA.  Pt reports that she has been having neck pain and headaches to the point where she will be having an MRI tomorrow.  Pt reports having 2 falls (August 2nd, August 6th).  Pt notes that the first fall she was trying to get out of the recliner and she fell forward.  Pt reports on the second fall, she missed a step. Pt reports she feels like she has POTS, and that it runs in  her family as well (granddaughte,r and grandson).   Pt had gabapentin  increased from 1 time in the morning and 1 time at night, to 2 at each time frame, and then recently switched to 3 in the morning and 3  at night. Pt reports she was told by MD to ambulate with a walker, however she has not been doing that.  PMH: asthma, CAD, DM2, heart murmur, L TKA, HTN, Depressive disorder,migraines  PAIN:  Are you having pain? 6/10 Rt hemi headache  PRECAUTIONS: Pt has hx of breast cancer and finished radiation back in May.    WEIGHT BEARING RESTRICTIONS: No  FALLS: Has patient fallen in last 6 months? Yes. Number of falls 2  LIVING ENVIRONMENT: Lives with: mother lives with pt Lives in: Apartment Stairs: Elevator Has following equipment at home: Single point cane and Environmental consultant - 2 wheeled  PLOF: Independent  PATIENT GOALS: Being less wobbly and feel more balances.  OBJECTIVE:  Note: Objective measures were completed at Evaluation unless otherwise noted.  DIAGNOSTIC FINDINGS: Pt to have an MRI tomorrow.    COGNITION: Overall cognitive status: Within functional limits for tasks assessed   SENSATION: WFL, how pt notes burning in the bottom of her feet.  Pt reports she has neuropathy in the feet.  COORDINATION: Not formally tested   ORTHOSTATICS:  Eval Supine - BP: 131/52  mmHG  Sitting - BP: 112/55 mmHg  Standing - BP: 96/53 mmHg  Standing +3 Min - BP: 133/70 mmHg    11/18/23  117/44mmHg 78bpm  supine  102/57mmHg 80bpm seated 0 118/13mmHg 84bpm seated x1 minute 87/25mmHg 84bpm standing x0 minutes 104/68mmHg 88bpm standing x1 minute 105/33mmHg 87bpm standing x2 minute 110/57mmHg 90bpm standing post 435ftAMB   FUNCTIONAL TESTS:  5 times sit to stand: 19.04 sec Timed up and go (TUG): 14.68 sec 6 minute walk test: TBD 10 meter walk test: 15.32 sec; 0.65 m/s Dynamic Gait Index   PATIENT SURVEYS:  ABC Scale:  64% (9/10)                                                                                                                               TREATMENT DATE: 11/18/23 -orthostatic vital signs  - : tolerates on 475ft over 42m26sec before legs before increasingly  ataxic -yeelow TB at knees with low dumbbell grasp and transfer 10x from rack to COW 8 ft away, minGUardA then AMB 50t  PATIENT EDUCATION: Education details: Pt educated on role of PT and services provided during current POC, along with prognosis and information about the clinic.  Person educated: Patient Education method: Explanation Education comprehension: verbalized understanding  HOME EXERCISE PROGRAM: TBD at subsequent visit  GOALS: Goals reviewed with patient? Yes  SHORT TERM GOALS: Target date: 12/03/2023   Pt will be independent with HEP in order to demonstrate increased ability to perform tasks related to occupation/hobbies. Baseline: to be given at subsequent visit Goal status: INITIAL   LONG TERM GOALS: Target  date: 01/28/2024  1.  Patient (> 40 years old) will complete five times sit to stand test in < 15 seconds indicating an increased LE strength and improved balance. Baseline: 19.04 sec Goal status: INITIAL  2.  Pt will improve ABC by at least 13% in order to demonstrate clinically significant improvement in balance confidence.  Baseline: TBD Goal status: INITIAL   3.  Pt will improve DGI by at least 3 points in order to demonstrate clinically significant improvement in balance and decreased risk for falls. Baseline: TBD Goal status: INITIAL   4.  Patient will reduce timed up and go to <11 seconds to reduce fall risk and demonstrate improved transfer/gait ability. Baseline: 14.68 sec Goal status: INITIAL  5.  Patient will increase 10 meter walk test to >1.16m/s as to improve gait speed for better community ambulation and to reduce fall risk. Baseline: 15.32 sec; 0.65 m/s Goal status: INITIAL  6.  Patient will increase six minute walk test distance to >1000 for progression to community ambulator and improve gait ability Baseline: TBD Goal status: INITIAL   ASSESSMENT:  CLINICAL IMPRESSION: Pt remains orthostatic again despite recent DC of diuretic by  PCP. Pt is symptomatic with attempted . Educated on self management of chronic orthostatic BP based on CDC guidlines from [facility]. Pt will benefit from skilled therapy to address tolerance, balance, and strength impairments necessary for improvement in quality of life.  Pt. demonstrates understanding of this plan of care and agrees with this plan.    OBJECTIVE IMPAIRMENTS: Abnormal gait, decreased balance, decreased mobility, difficulty walking, dizziness, and improper body mechanics.   ACTIVITY LIMITATIONS: lifting, sitting, standing, stairs, transfers, reach over head, and locomotion level  PARTICIPATION LIMITATIONS: meal prep, cleaning, laundry, driving, shopping, community activity, and yard work  PERSONAL FACTORS: Age, Time since onset of injury/illness/exacerbation, and 3+ comorbidities: asthma, CAD, DM2, heart murmur, L TKA, HTN, Depressive disorder,migraines are also affecting patient's functional outcome.   REHAB POTENTIAL: Good  CLINICAL DECISION MAKING: Evolving/moderate complexity  EVALUATION COMPLEXITY: Moderate  PLAN:  PT FREQUENCY: 2x/week  PT DURATION: 12 weeks  PLANNED INTERVENTIONS: 97750- Physical Performance Testing, 97110-Therapeutic exercises, 97530- Therapeutic activity, [id number]- Neuromuscular re-education, 97535- Self Care, 02859- Manual therapy, [id number]- Gait training, [phone]- Canalith repositioning, Balance training, and Stair training  PLAN FOR NEXT SESSION:  -vitals as needed -FU on fluid/electrolyte intake -advance resisted intensity exercises,  high intensity intervals.     9:09 AM, 11/18/23 Peggye JAYSON Linear, PT, DPT Physical Therapist - [facility]  Outpatient Physical Therapy- Main Campus [phone]

## 2023-11-20 ENCOUNTER — Ambulatory Visit: Admitting: Physical Therapy

## 2023-11-20 DIAGNOSIS — M6281 Muscle weakness (generalized): Secondary | ICD-10-CM

## 2023-11-20 DIAGNOSIS — R2689 Other abnormalities of gait and mobility: Secondary | ICD-10-CM

## 2023-11-20 DIAGNOSIS — R293 Abnormal posture: Secondary | ICD-10-CM

## 2023-11-20 DIAGNOSIS — R262 Difficulty in walking, not elsewhere classified: Secondary | ICD-10-CM

## 2023-11-20 NOTE — Therapy (Signed)
 OUTPATIENT PHYSICAL THERAPY NEURO    Patient Name: Bianca Rogers MRN: [id number] DOB:[birth year], 73 y.o., female Today's Date: 11/20/2023  PCP: Vincente Shivers, NP  REFERRING PROVIDER: Lane Arthea BRAVO, MD   END OF SESSION:  PT End of Session - 11/20/23 0850     Visit Number 3    Number of Visits 25    Date for PT Re-Evaluation 01/28/24    Authorization Type humana medicare    Authorization Time Period 11/05/23-02/07/24 24 visits    Authorization - Number of Visits 24    Progress Note Due on Visit 10    PT Start Time 0850    PT Stop Time 0930    PT Time Calculation (min) 40 min    Equipment Utilized During Treatment Gait belt    Activity Tolerance Patient tolerated treatment well;No increased pain    Behavior During Therapy [facility] for tasks assessed/performed          Past Medical History:  Diagnosis Date   Anal fissure 12/09/2016   Added automatically from request for surgery [id number]    Added automatically from request for surgery [id number]     Arthralgia of left knee 12/03/2020   Asthma 08/29/2022   Chronic/stable.    Follows with Dr. Elayne, pulmonology.   Using Flovent  and albuterol  inhaler as needed.     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     Heart murmur    History of total knee arthroplasty 08/21/2021   Hyperlipidemia 08/29/2022   Lab Results   Component Value Date    Cholesterol 197 01/28/2022      Lab Results   Component Value Date    HDL 56 01/28/2022      Lab Results   Component Value Date    LDL Calculated 115 01/28/2022      Lab Results   Component Value Date    Triglycerides 131 01/28/2022      Lab  Results   Component Value Date    Chol/HDL Ratio 3.5 01/28/2022      Criteria used to determine recommendation include:    Hypertension    Invasive carcinoma of breast (HCC)    Liver cirrhosis secondary to NASH (HCC)    Lung nodule    Major depressive disorder in full remission (HCC) 04/02/2020   Mood stable on venlafaxine  150 mg daily.     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 breast of female, estrogen receptor positive (HCC)    Migraine 08/29/2022   NAFLD (nonalcoholic fatty liver disease) 92/76/7985   Non-alcoholic cirrhosis (HCC)    Osteoarthritis of left knee 09/03/2020   Peritoneal hematoma 07/07/2019   Sprain of MCL (medial collateral ligament) of knee 09/03/2020   Symptom associated with female genital organs 07/09/2010   Past Surgical History:  Procedure Laterality Date   ABDOMINAL HYSTERECTOMY     AXILLARY SENTINEL NODE BIOPSY Left 04/22/2023   Procedure: AXILLARY SENTINEL NODE BIOPSY;  Surgeon: Lane Shope, MD;  Location: [facility];  Service: General;  Laterality: Left;   BREAST BIOPSY Left 03/31/2023   Us  Core bx, coil clip - path pending   BREAST BIOPSY  Left 03/31/2023   US  LT BREAST BX W LOC DEV 1ST LESION IMG BX SPEC US  GUIDE 03/31/2023 [facility]   BREAST BIOPSY Left 04/20/2023   US  LT RADIO FREQUENCY TAG LOC US  GUIDE 04/20/2023 [facility]   BREAST LUMPECTOMY WITH RADIOFREQUENCY TAG IDENTIFICATION Left 04/22/2023   Procedure: BREAST LUMPECTOMY WITH RADIOFREQUENCY TAG IDENTIFICATION;  Surgeon: Lane Shope, MD;  Location: [facility];  Service: General;  Laterality: Left;   CARDIAC CATHETERIZATION  03/10/2012   Negative   CATARACT EXTRACTION Bilateral    CESAREAN SECTION     CHOLECYSTECTOMY     RE-EXCISION OF BREAST LUMPECTOMY Left 05/20/2023   Procedure: EXCISION, LESION, BREAST, REPEAT;  Surgeon: Lane Shope, MD;  Location: [facility];  Service: General;  Laterality: Left;   SPLENECTOMY     TONSILLECTOMY     Patient Active Problem  List   Diagnosis Date Noted   Chronic nonintractable headache 09/08/2023   Osteopenia 05/27/2023   Genetic testing 05/15/2023   Change in facial mole 04/17/2023   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 breast of female, estrogen receptor positive (HCC) 04/09/2023   Invasive carcinoma of breast (HCC) 04/08/2023   Family history of breast cancer 04/08/2023   Gastroesophageal reflux disease 03/02/2023   Liver cirrhosis secondary to NASH (HCC) 03/02/2023   Bronchiectasis (HCC) 06/09/2013   Type 2 diabetes mellitus with diabetic neuropathy (HCC) 06/09/2013   HTN (hypertension), benign 06/09/2013    ONSET DATE: 10/09/23  REFERRING DIAG: R29.6 (ICD-10-CM) - Frequent falls   THERAPY DIAG:  Imbalance  Difficulty in walking, not elsewhere classified  Muscle weakness (generalized)  Abnormal posture  Rationale for Evaluation and Treatment: Rehabilitation  SUBJECTIVE:                                                                                                                                                                                             SUBJECTIVE STATEMENT:  Pt reports that she is feeling better today. HA is doing a little better. States that MD increased Gabapentin , which has helped slightly.  Reports that she has been adding sea salt to water  for increased Sodium intake. Was given hydration/electrolyte powder by daughter, but doesn't think she will use the powder because it is too sweet.   Pt accompanied by: self  PERTINENT HISTORY:   Pt reports she has had PT in [place] before for her neck and after her L TKA.  Pt reports that she has been having neck pain and headaches to the point where she will be having an MRI tomorrow.  Pt reports having 2 falls (August 2nd, August 6th).  Pt notes that the first fall she was trying to get out of the recliner and she fell forward.  Pt reports on the second fall, she missed a step. Pt  reports she feels like she has POTS, and that it runs in her family as well (granddaughte,r and grandson).   Pt had gabapentin  increased from 1 time in the morning and 1 time at night, to 2 at each time frame, and then recently switched to 3 in the morning and 3 at night. Pt reports she was told by MD to ambulate with a walker, however she has not been doing that.  PMH: asthma, CAD, DM2, heart murmur, L TKA, HTN, Depressive disorder,migraines  PAIN:  Are you having pain? 6/10 Rt hemi headache  PRECAUTIONS: Pt has hx of breast cancer and finished radiation back in May.    WEIGHT BEARING RESTRICTIONS: No  FALLS: Has patient fallen in last 6 months? Yes. Number of falls 2  LIVING ENVIRONMENT: Lives with: mother lives with pt Lives in: Apartment Stairs: Elevator Has following equipment at home: Single point cane and Environmental consultant - 2 wheeled  PLOF: Independent  PATIENT GOALS: Being less wobbly and feel more balances.  OBJECTIVE:  Note: Objective measures were completed at Evaluation unless otherwise noted.  DIAGNOSTIC FINDINGS: Pt to have an MRI tomorrow.    COGNITION: Overall cognitive status: Within functional limits for tasks assessed   SENSATION: WFL, how pt notes burning in the bottom of her feet.  Pt reports she has neuropathy in the feet.  COORDINATION: Not formally tested   ORTHOSTATICS:  Eval Supine - BP: 131/52  mmHG  Sitting - BP: 112/55 mmHg  Standing - BP: 96/53 mmHg  Standing +3 Min - BP: 133/70 mmHg    11/18/23  117/33mmHg 78bpm  supine  102/62mmHg 80bpm seated 0 118/73mmHg 84bpm seated x1 minute 87/5mmHg 84bpm standing x0 minutes 104/68mmHg 88bpm standing x1 minute 105/76mmHg 87bpm standing x2 minute 110/55mmHg 90bpm standing post 459ftAMB    9/12:  Seated 121/63 HR 68bpm  Standing 0 min 120/60 HR79  Standing 1 min 127/69 HR 86  Following gait for 160ft 126/56 HR82     FUNCTIONAL TESTS:  5 times sit to stand: 19.04 sec Timed up and go (TUG): 14.68  sec 6 minute walk test:  9/10: tolerates on 473ft over 67m26sec before legs before increasingly ataxic  9/12: gait training with use of 4WW; tolerated 641ft, for 4:32, no significant ataxia noted, and only mild pain in sacrum area.  10 meter walk test: 15.32 sec; 0.65 m/s Dynamic Gait Index   PATIENT SURVEYS:  ABC Scale:  64% (9/10)                                                                                                                               TREATMENT DATE: 11/20/23 -orthostatic vital signs   Gait with no UE support x 142ft while performing VS assessment. Mild unsteadiness in BLE at ~  167ft requiring standing rest break. Pt describes slight vision unsteadiness, but unable to elaborate.   Prolonged gait training with use of 4WW; tolerated 668ft, for 4:32, no significant ataxia noted, and only mild pain in sacrum area.   Sit<>stand from arm chair 2 x 5 with therapeutic rest break instruction for improved breathing with exertion to reduce lightheadedness.     PATIENT EDUCATION: Education details: Pt educated on role of PT and services provided during current POC, along with prognosis and information about the clinic.  Person educated: Patient Education method: Explanation Education comprehension: verbalized understanding  HOME EXERCISE PROGRAM: TBD at subsequent visit  GOALS: Goals reviewed with patient? Yes  SHORT TERM GOALS: Target date: 12/03/2023   Pt will be independent with HEP in order to demonstrate increased ability to perform tasks related to occupation/hobbies. Baseline: to be given at subsequent visit Goal status: INITIAL   LONG TERM GOALS: Target date: 01/28/2024  1.  Patient (> 81 years old) will complete five times sit to stand test in < 15 seconds indicating an increased LE strength and improved balance. Baseline: 19.04 sec Goal status: INITIAL  2.  Pt will improve ABC by at least 13% in order to demonstrate clinically significant improvement  in balance confidence.  Baseline: TBD Goal status: INITIAL   3.  Pt will improve DGI by at least 3 points in order to demonstrate clinically significant improvement in balance and decreased risk for falls. Baseline: TBD Goal status: INITIAL   4.  Patient will reduce timed up and go to <11 seconds to reduce fall risk and demonstrate improved transfer/gait ability. Baseline: 14.68 sec Goal status: INITIAL  5.  Patient will increase 10 meter walk test to >1.66m/s as to improve gait speed for better community ambulation and to reduce fall risk. Baseline: 15.32 sec; 0.65 m/s Goal status: INITIAL  6.  Patient will increase six minute walk test distance to >1000 for progression to community ambulator and improve gait ability Baseline: 9/10: tolerates on 465ft over 72m26sec before legs before increasingly ataxic  Goal status: INITIAL   ASSESSMENT:  CLINICAL IMPRESSION: Pt remains symptomatic with dizziness with sit<>stand, but was not orthostatic on this day with VS assessment. Reports that she has increased sodium intake, but not supplementing other electrolytes yet due to cost and taste of liquid IV and powders. Was able to tolerated increased distance with less pain and less gait deficits with use of 4WW compared to no AD assessed in prior session.  Pwill continue to benefit from PT to address balance deficits. Pt. demonstrates understanding of this plan of care and agrees with this plan.    OBJECTIVE IMPAIRMENTS: Abnormal gait, decreased balance, decreased mobility, difficulty walking, dizziness, and improper body mechanics.   ACTIVITY LIMITATIONS: lifting, sitting, standing, stairs, transfers, reach over head, and locomotion level  PARTICIPATION LIMITATIONS: meal prep, cleaning, laundry, driving, shopping, community activity, and yard work  PERSONAL FACTORS: Age, Time since onset of injury/illness/exacerbation, and 3+ comorbidities: asthma, CAD, DM2, heart murmur, L TKA, HTN, Depressive  disorder,migraines are also affecting patient's functional outcome.   REHAB POTENTIAL: Good  CLINICAL DECISION MAKING: Evolving/moderate complexity  EVALUATION COMPLEXITY: Moderate  PLAN:  PT FREQUENCY: 2x/week  PT DURATION: 12 weeks  PLANNED INTERVENTIONS: 97750- Physical Performance Testing, 97110-Therapeutic exercises, 97530- Therapeutic activity, [id number]- Neuromuscular re-education, 97535- Self Care, 02859- Manual therapy, [id number]- Gait training, [phone]- Canalith repositioning, Balance training, and Stair training  PLAN FOR NEXT SESSION:  -vitals as needed -FU on fluid/electrolyte intake Gait with 4WW to  increase tolerance and variable training for ataxia management.    Massie Dollar PT, DPT  Physical Therapist - [place]  [facility]  8:51 AM 11/20/23

## 2023-11-23 NOTE — Telephone Encounter (Signed)
 Refill request for   gabapentin  (NEURONTIN ) 100 MG capsule    LOV - 11/11/23 Next OV - 11/25/23 Last refill - 06/23/23 #120/0

## 2023-11-25 ENCOUNTER — Ambulatory Visit (INDEPENDENT_AMBULATORY_CARE_PROVIDER_SITE_OTHER): Admitting: General Practice

## 2023-11-25 ENCOUNTER — Encounter: Payer: Self-pay | Admitting: General Practice

## 2023-11-25 ENCOUNTER — Ambulatory Visit: Payer: Self-pay | Admitting: General Practice

## 2023-11-25 ENCOUNTER — Ambulatory Visit: Admitting: Physical Therapy

## 2023-11-25 VITALS — BP 120/60 | HR 86 | Temp 98.3°F | Ht 64.0 in | Wt 145.2 lb

## 2023-11-25 DIAGNOSIS — R2689 Other abnormalities of gait and mobility: Secondary | ICD-10-CM | POA: Diagnosis not present

## 2023-11-25 DIAGNOSIS — I10 Essential (primary) hypertension: Secondary | ICD-10-CM

## 2023-11-25 DIAGNOSIS — R42 Dizziness and giddiness: Secondary | ICD-10-CM

## 2023-11-25 DIAGNOSIS — E782 Mixed hyperlipidemia: Secondary | ICD-10-CM

## 2023-11-25 DIAGNOSIS — I679 Cerebrovascular disease, unspecified: Secondary | ICD-10-CM | POA: Insufficient documentation

## 2023-11-25 DIAGNOSIS — Z7984 Long term (current) use of oral hypoglycemic drugs: Secondary | ICD-10-CM

## 2023-11-25 DIAGNOSIS — G8929 Other chronic pain: Secondary | ICD-10-CM

## 2023-11-25 DIAGNOSIS — E1140 Type 2 diabetes mellitus with diabetic neuropathy, unspecified: Secondary | ICD-10-CM | POA: Diagnosis not present

## 2023-11-25 DIAGNOSIS — F339 Major depressive disorder, recurrent, unspecified: Secondary | ICD-10-CM

## 2023-11-25 DIAGNOSIS — R293 Abnormal posture: Secondary | ICD-10-CM

## 2023-11-25 DIAGNOSIS — M6281 Muscle weakness (generalized): Secondary | ICD-10-CM

## 2023-11-25 DIAGNOSIS — R262 Difficulty in walking, not elsewhere classified: Secondary | ICD-10-CM

## 2023-11-25 DIAGNOSIS — R519 Headache, unspecified: Secondary | ICD-10-CM

## 2023-11-25 LAB — BASIC METABOLIC PANEL WITH GFR
BUN: 16 mg/dL (ref 6–23)
CO2: 23 meq/L (ref 19–32)
Calcium: 9.5 mg/dL (ref 8.4–10.5)
Chloride: 105 meq/L (ref 96–112)
Creatinine, Ser: 0.93 mg/dL (ref 0.40–1.20)
GFR: 60.77 mL/min (ref >60.00)
Glucose, Bld: 121 mg/dL — ABNORMAL HIGH (ref 70–99)
Potassium: 3.9 meq/L (ref 3.5–5.1)
Sodium: 140 meq/L (ref 135–145)

## 2023-11-25 MED ORDER — PRAVASTATIN SODIUM 80 MG PO TABS: 80.0000 mg | ORAL_TABLET | Freq: Every evening | ORAL | 1 refills | Status: AC

## 2023-11-25 MED ORDER — VENLAFAXINE HCL ER 150 MG PO CP24: 150.0000 mg | ORAL_CAPSULE | Freq: Every day | ORAL | 1 refills | Status: AC

## 2023-11-25 MED ORDER — METFORMIN HCL ER 500 MG PO TB24: 1000.0000 mg | ORAL_TABLET | Freq: Two times a day (BID) | ORAL | 0 refills | Status: DC

## 2023-11-25 MED ORDER — LISINOPRIL 20 MG PO TABS: 20.0000 mg | ORAL_TABLET | Freq: Every day | ORAL | 1 refills | Status: AC

## 2023-11-25 MED ORDER — EZETIMIBE 10 MG PO TABS: 10.0000 mg | ORAL_TABLET | Freq: Every evening | ORAL | 1 refills | Status: AC

## 2023-11-25 NOTE — Therapy (Signed)
 OUTPATIENT PHYSICAL THERAPY NEURO    Patient Name: Bianca Rogers MRN: [id number] DOB:1950-11-16, 73 y.o., female Today's Date: 11/25/2023  PCP: Vincente Shivers, NP  REFERRING PROVIDER: Lane Arthea BRAVO, MD   END OF SESSION:  PT End of Session - 11/25/23 0933     Visit Number 4    Number of Visits 25    Date for PT Re-Evaluation 01/28/24    Authorization Type [employer] medicare    Authorization Time Period 11/05/23-02/07/24 24 visits    Authorization - Number of Visits 24    Progress Note Due on Visit 10    PT Start Time 0935    PT Stop Time 1015    PT Time Calculation (min) 40 min    Equipment Utilized During Treatment Gait belt    Activity Tolerance Patient tolerated treatment well;No increased pain    Behavior During Therapy [facility] for tasks assessed/performed          Past Medical History:  Diagnosis Date   Anal fissure 12/09/2016   Added automatically from request for surgery [id number]    Added automatically from request for surgery [id number]     Arthralgia of left knee 12/03/2020   Asthma 08/29/2022   Chronic/stable.    Follows with Dr. Elayne, pulmonology.   Using Flovent  and albuterol  inhaler as needed.     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     Heart murmur    History of total knee arthroplasty 08/21/2021   Hyperlipidemia 08/29/2022   Lab Results   Component Value Date    Cholesterol 197 01/28/2022      Lab Results   Component Value Date    HDL 56 01/28/2022      Lab Results   Component Value Date    LDL Calculated 115 01/28/2022      Lab Results   Component Value Date    Triglycerides 131 01/28/2022      Lab  Results   Component Value Date    Chol/HDL Ratio 3.5 01/28/2022      Criteria used to determine recommendation include:    Hypertension    Invasive carcinoma of breast (HCC)    Liver cirrhosis secondary to NASH (HCC)    Lung nodule    Major depressive disorder in full remission (HCC) 04/02/2020   Mood stable on venlafaxine  150 mg daily.     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 breast of female, estrogen receptor positive (HCC)    Migraine 08/29/2022   NAFLD (nonalcoholic fatty liver disease) 92/76/7985   Non-alcoholic cirrhosis (HCC)    Osteoarthritis of left knee 09/03/2020   Peritoneal hematoma 07/07/2019   Sprain of MCL (medial collateral ligament) of knee 09/03/2020   Symptom associated with female genital organs 07/09/2010   Past Surgical History:  Procedure Laterality Date   ABDOMINAL HYSTERECTOMY     AXILLARY SENTINEL NODE BIOPSY Left 04/22/2023   Procedure: AXILLARY SENTINEL NODE BIOPSY;  Surgeon: Lane Shope, MD;  Location: [facility] ORS;  Service: General;  Laterality: Left;   BREAST BIOPSY Left 03/31/2023   Us  Core bx, coil clip - path pending   BREAST BIOPSY  Left 03/31/2023   US  LT BREAST BX W LOC DEV 1ST LESION IMG BX SPEC US  GUIDE 03/31/2023 [facility]   BREAST BIOPSY Left 04/20/2023   US  LT RADIO FREQUENCY TAG LOC US  GUIDE 04/20/2023 [facility]   BREAST LUMPECTOMY WITH RADIOFREQUENCY TAG IDENTIFICATION Left 04/22/2023   Procedure: BREAST LUMPECTOMY WITH RADIOFREQUENCY TAG IDENTIFICATION;  Surgeon: Lane Shope, MD;  Location: [facility] ORS;  Service: General;  Laterality: Left;   CARDIAC CATHETERIZATION  03/10/2012   Negative   CATARACT EXTRACTION Bilateral    CESAREAN SECTION     CHOLECYSTECTOMY     RE-EXCISION OF BREAST LUMPECTOMY Left 05/20/2023   Procedure: EXCISION, LESION, BREAST, REPEAT;  Surgeon: Lane Shope, MD;  Location: [facility] ORS;  Service: General;  Laterality: Left;   SPLENECTOMY     TONSILLECTOMY     Patient Active Problem  List   Diagnosis Date Noted   Chronic nonintractable headache 09/08/2023   Osteopenia 05/27/2023   Genetic testing 05/15/2023   Change in facial mole 04/17/2023   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 breast of female, estrogen receptor positive (HCC) 04/09/2023   Invasive carcinoma of breast (HCC) 04/08/2023   Family history of breast cancer 04/08/2023   Gastroesophageal reflux disease 03/02/2023   Liver cirrhosis secondary to NASH (HCC) 03/02/2023   Bronchiectasis (HCC) 06/09/2013   Type 2 diabetes mellitus with diabetic neuropathy (HCC) 06/09/2013   HTN (hypertension), benign 06/09/2013    ONSET DATE: 10/09/23  REFERRING DIAG: R29.6 (ICD-10-CM) - Frequent falls   THERAPY DIAG:  Imbalance  Difficulty in walking, not elsewhere classified  Muscle weakness (generalized)  Abnormal posture  Rationale for Evaluation and Treatment: Rehabilitation  SUBJECTIVE:                                                                                                                                                                                             SUBJECTIVE STATEMENT:  Pt reports that she is feeling better today. Pt reports only slight shoulder pain with mild HA.   Pt accompanied by: self  PERTINENT HISTORY:   Pt reports she has had PT in [place] before for her neck and after her L TKA.  Pt reports that she has been having neck pain and headaches to the point where she will be having an MRI tomorrow.  Pt reports having 2 falls (August 2nd, August 6th).  Pt notes that the first fall she was trying to get out of the recliner and she fell forward.  Pt reports on the second fall, she missed a step. Pt reports she feels like she has POTS, and  that it runs in her family as well (granddaughte,r and grandson).   Pt had gabapentin  increased from 1 time in the morning and 1 time at night, to 2 at each time frame, and then recently switched to 3 in  the morning and 3 at night. Pt reports she was told by MD to ambulate with a walker, however she has not been doing that.  PMH: asthma, CAD, DM2, heart murmur, L TKA, HTN, Depressive disorder,migraines  PAIN:  Are you having pain? 6/10 Rt hemi headache  PRECAUTIONS: Pt has hx of breast cancer and finished radiation back in May.    WEIGHT BEARING RESTRICTIONS: No  FALLS: Has patient fallen in last 6 months? Yes. Number of falls 2  LIVING ENVIRONMENT: Lives with: mother lives with pt Lives in: Apartment Stairs: Elevator Has following equipment at home: Single point cane and Environmental consultant - 2 wheeled  PLOF: Independent  PATIENT GOALS: Being less wobbly and feel more balances.  OBJECTIVE:  Note: Objective measures were completed at Evaluation unless otherwise noted.  DIAGNOSTIC FINDINGS: Pt to have an MRI tomorrow.    COGNITION: Overall cognitive status: Within functional limits for tasks assessed   SENSATION: WFL, how pt notes burning in the bottom of her feet.  Pt reports she has neuropathy in the feet.  COORDINATION: Not formally tested   ORTHOSTATICS:  Eval Supine - BP: 131/52  mmHG  Sitting - BP: 112/55 mmHg  Standing - BP: 96/53 mmHg  Standing +3 Min - BP: 133/70 mmHg    11/18/23  117/52mmHg 78bpm  supine  102/60mmHg 80bpm seated 0 118/39mmHg 84bpm seated x1 minute 87/78mmHg 84bpm standing x0 minutes 104/3mmHg 88bpm standing x1 minute 105/4mmHg 87bpm standing x2 minute 110/86mmHg 90bpm standing post [id number]    9/12:  Seated 121/63 HR 68bpm  Standing 0 min 120/60 HR79  Standing 1 min 127/69 HR 86  Following gait for 169ft 126/56 HR82     9/17:  Seated: 115/61 HR 76 Standing: 81/49 HR 79 mild lightheadedness Standing 1 min 101/60 HR 87  reduced s/s but still slightly present  FUNCTIONAL TESTS:  5 times sit to stand: 19.04 sec Timed up and go (TUG): 14.68 sec 6 minute walk test:  9/10: tolerates on 479ft over 74m26sec before legs before increasingly  ataxic  9/12: gait training with use of 4WW; tolerated 668ft, for 4:32, no significant ataxia noted, and only mild pain in sacrum area.  10 meter walk test: 15.32 sec; 0.65 m/s Dynamic Gait Index   PATIENT SURVEYS:  ABC Scale:  64% (9/10)                                                                                                                               TREATMENT DATE: 11/25/23 -orthostatic vital signs   Gait with 4WW 379ft x 3 throughout session. Min cues for wide BOS. Pt reports slight weakness in the R knee on first bout but no significant changes in gait  pattern.  Gait through rehab gym to navigate obstacles in gym to simulate community mobility. X 120ft.   Standing at rail with BUE support:   Partial squat x 12 Step up 4inch step x 8 bil  Side stepping at rail x 4 bil then reporting increased dizziness requiring seated rest break.   Sit<>stand from arm chair 2 x 5 with therapeutic rest break instruction for gaze stabilization with sit<>stand; reports slightly reduced dizziness.  Multiple therapeutic rest breaks throughout session due to orthostatic hypotension s/s as well as mild SOB    PATIENT EDUCATION: Education details: Pt educated on role of PT and services provided during current POC, along with prognosis and information about the clinic.  Person educated: Patient Education method: Explanation Education comprehension: verbalized understanding  HOME EXERCISE PROGRAM: TBD at subsequent visit  GOALS: Goals reviewed with patient? Yes  SHORT TERM GOALS: Target date: 12/03/2023   Pt will be independent with HEP in order to demonstrate increased ability to perform tasks related to occupation/hobbies. Baseline: to be given at subsequent visit Goal status: INITIAL   LONG TERM GOALS: Target date: 01/28/2024  1.  Patient (> 36 years old) will complete five times sit to stand test in < 15 seconds indicating an increased LE strength and improved  balance. Baseline: 19.04 sec Goal status: INITIAL  2.  Pt will improve ABC by at least 13% in order to demonstrate clinically significant improvement in balance confidence.  Baseline: TBD Goal status: INITIAL   3.  Pt will improve DGI by at least 3 points in order to demonstrate clinically significant improvement in balance and decreased risk for falls. Baseline: TBD Goal status: INITIAL   4.  Patient will reduce timed up and go to <11 seconds to reduce fall risk and demonstrate improved transfer/gait ability. Baseline: 14.68 sec Goal status: INITIAL  5.  Patient will increase 10 meter walk test to >1.53m/s as to improve gait speed for better community ambulation and to reduce fall risk. Baseline: 15.32 sec; 0.65 m/s Goal status: INITIAL  6.  Patient will increase six minute walk test distance to >1000 for progression to community ambulator and improve gait ability Baseline: 9/10: tolerates on 436ft over 41m26sec before legs before increasingly ataxic  Goal status: INITIAL   ASSESSMENT:  CLINICAL IMPRESSION: Pt remains symptomatic with dizziness with sit<>stand, with mild/moderate hypotension in standing, but recoverd with time in standing. Continued to perform prolonged gait training with 4WW to allow increased distance and reduced ataxia with fatigue.no pain reported with dynamic mobility and strengthening throughout session on this day with use of rail for arm support in various planes of motion. Pt will continue to benefit from PT to address balance deficits. Pt. demonstrates understanding of this plan of care and agrees with this plan.    OBJECTIVE IMPAIRMENTS: Abnormal gait, decreased balance, decreased mobility, difficulty walking, dizziness, and improper body mechanics.   ACTIVITY LIMITATIONS: lifting, sitting, standing, stairs, transfers, reach over head, and locomotion level  PARTICIPATION LIMITATIONS: meal prep, cleaning, laundry, driving, shopping, community activity, and  yard work  PERSONAL FACTORS: Age, Time since onset of injury/illness/exacerbation, and 3+ comorbidities: asthma, CAD, DM2, heart murmur, L TKA, HTN, Depressive disorder,migraines are also affecting patient's functional outcome.   REHAB POTENTIAL: Good  CLINICAL DECISION MAKING: Evolving/moderate complexity  EVALUATION COMPLEXITY: Moderate  PLAN:  PT FREQUENCY: 2x/week  PT DURATION: 12 weeks  PLANNED INTERVENTIONS: 97750- Physical Performance Testing, 97110-Therapeutic exercises, 97530- Therapeutic activity, [id number]- Neuromuscular re-education, 97535- Self Care, 02859- Manual therapy, 02883-  Gait training, 04007- Canalith repositioning, Balance training, and Stair training  PLAN FOR NEXT SESSION:  -vitals as needed -FU on fluid/electrolyte intake Gait with 4WW to increase tolerance and variable training for ataxia management.    Massie Dollar PT, DPT  Physical Therapist - [place]  [facility]  9:34 AM 11/25/23

## 2023-11-25 NOTE — Telephone Encounter (Signed)
 Patient will discuss with neurologist and let us  know if she needs a refill.

## 2023-11-25 NOTE — Telephone Encounter (Signed)
 Discuss during ov if needs to be filled; patient unsure who needs to refill it?

## 2023-11-25 NOTE — Progress Notes (Signed)
 Established Patient Office Visit  Subjective   Patient ID: Bianca Rogers, female    DOB: [birth year]  Age: 73 y.o. MRN: [id number]  Chief Complaint  Patient presents with   Hypertension    Patient here today to follow up on BP. Patient is currently taking lisinopril  daily.     Hypertension Pertinent negatives include no chest pain, headaches or shortness of breath.    Bianca Rogers is a 73 year old female with past medical history of HNT, bronchiectasis, GERD, liver cirrhosis, type 2 DM, osteopenia presents today for a follow up.   Discussed the use of AI scribe software for clinical note transcription with the patient, who gave verbal consent to proceed.  History of Present Illness She experiences dizziness and headaches, previously associated with episodes of low blood pressure upon movement. Her blood pressure medication was adjusted by discontinuing hydrochlorothiazide and continuing lisinopril . Since the adjustment, she has had only one episode of dizziness with low blood pressure in the last two weeks, a significant improvement from previous frequent episodes. Her blood pressure readings have been stable, with a recent reading of 115/81 while sitting and 81 (systolic) while standing, accompanied by mild lightheadedness. She drinks plenty of water  with pink salt to manage her hydration.  Regarding her medication regimen, she was initially on gabapentin  200 mg twice daily, which was increased to 300 mg twice daily by the neurologist. The dizziness remained about the same with this change, but her headaches have improved.  She is managing type 2 diabetes with metformin , which she is running low on and requires a refill. She takes Effexor  (venlafaxine ) 150 mg for depression, for which she needs a refill. She is also on Zetia  and pravastatin  for cholesterol management, as her cholesterol was noted to be slightly high previously. She needs refill on both.    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 breast of female, estrogen receptor positive (HCC) 04/09/2023   Invasive carcinoma of breast (HCC) 04/08/2023   Family history of breast cancer 04/08/2023   Gastroesophageal reflux disease 03/02/2023   Liver cirrhosis secondary to NASH (HCC) 03/02/2023   Bronchiectasis (HCC) 06/09/2013   Type 2 diabetes mellitus with diabetic neuropathy (HCC) 06/09/2013   HTN (hypertension), benign 06/09/2013   Past Medical History:  Diagnosis Date   Anal fissure 12/09/2016   Added automatically from request for surgery [id number]    Added automatically from request for surgery [id number]     Arthralgia of left knee 12/03/2020   Asthma 08/29/2022   Chronic/stable.    Follows with Dr. Elayne, pulmonology.   Using Flovent  and albuterol  inhaler as needed.     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     Heart murmur    History of total knee arthroplasty 08/21/2021   Hyperlipidemia 08/29/2022   Lab Results   Component Value Date  Cholesterol 197 01/28/2022      Lab Results   Component Value Date    HDL 56 01/28/2022      Lab Results   Component Value Date    LDL Calculated 115 01/28/2022      Lab Results   Component Value Date    Triglycerides 131 01/28/2022      Lab Results   Component Value Date    Chol/HDL Ratio 3.5 01/28/2022      Criteria used to determine  recommendation include:    Hypertension    Invasive carcinoma of breast (HCC)    Liver cirrhosis secondary to NASH (HCC)    Lung nodule    Major depressive disorder in full remission (HCC) 04/02/2020   Mood stable on venlafaxine  150 mg daily.     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 breast of female, estrogen receptor positive (HCC)    Migraine 08/29/2022   NAFLD (nonalcoholic fatty liver disease) 92/76/7985   Non-alcoholic cirrhosis (HCC)    Osteoarthritis of left knee 09/03/2020   Peritoneal hematoma 07/07/2019   Sprain of MCL (medial collateral ligament) of knee 09/03/2020   Symptom associated with female genital organs 07/09/2010   Past Surgical History:  Procedure Laterality Date   ABDOMINAL HYSTERECTOMY     AXILLARY SENTINEL NODE BIOPSY Left 04/22/2023   Procedure: AXILLARY SENTINEL NODE BIOPSY;  Surgeon: Lane Shope, MD;  Location: [facility];  Service: General;  Laterality: Left;   BREAST BIOPSY Left 03/31/2023   Us  Core bx, coil clip - path pending   BREAST BIOPSY Left 03/31/2023   US  LT BREAST BX W LOC DEV 1ST LESION IMG BX SPEC US  GUIDE 03/31/2023 [facility]   BREAST BIOPSY Left 04/20/2023   US  LT RADIO FREQUENCY TAG LOC US  GUIDE 04/20/2023 [facility]   BREAST LUMPECTOMY WITH RADIOFREQUENCY TAG IDENTIFICATION Left 04/22/2023   Procedure: BREAST LUMPECTOMY WITH RADIOFREQUENCY TAG IDENTIFICATION;  Surgeon: Lane Shope, MD;  Location: [facility];  Service: General;  Laterality: Left;   CARDIAC CATHETERIZATION  03/10/2012   Negative   CATARACT EXTRACTION Bilateral    CESAREAN SECTION     CHOLECYSTECTOMY     RE-EXCISION OF BREAST LUMPECTOMY Left 05/20/2023   Procedure: EXCISION, LESION, BREAST, REPEAT;  Surgeon: Lane Shope, MD;  Location: [facility];  Service: General;  Laterality: Left;   SPLENECTOMY     TONSILLECTOMY     Allergies  Allergen Reactions   Penicillins Rash    Other reaction(s): unknown reaction   Zoloft [Sertraline Hcl]  Other (See Comments)    Paradoxical response   Codeine Other (See Comments) and Anxiety    Irritable, insomnia   Sertraline Anxiety         11/25/2023   12:31 PM 11/11/2023   11:07 AM 08/20/2023    9:12 AM  Depression screen PHQ 2/9  Decreased Interest 1 0 2  Down, Depressed, Hopeless 1 0 0  PHQ - 2 Score 2 0 2  Altered sleeping 1 2 1   Tired, decreased energy 1 3 1   Change in appetite 1 1 0  Feeling bad or failure about yourself  0 0 0  Trouble concentrating 2 2 1   Moving slowly or fidgety/restless 1 2 0  Suicidal thoughts 0 0 0  PHQ-9 Score 8 10 5   Difficult doing work/chores Somewhat difficult Somewhat difficult Not difficult at all       11/25/2023   12:31 PM 11/11/2023   11:07 AM 06/09/2023   12:02 PM 04/17/2023    2:51  PM  GAD 7 : Generalized Anxiety Score  Nervous, Anxious, on Edge 1 2 2 2   Control/stop worrying 1 1 1 1   Worry too much - different things 1 1 1 1   Trouble relaxing 1 1 1 1   Restless 0 0 1 1  Easily annoyed or irritable 2 3 1 2   Afraid - awful might happen 0 0 1 0  Total GAD 7 Score 6 8 8 8   Anxiety Difficulty Somewhat difficult Somewhat difficult Not difficult at all Not difficult at all      Review of Systems  Constitutional:  Negative for chills and fever.  Respiratory:  Negative for shortness of breath.   Cardiovascular:  Negative for chest pain.  Gastrointestinal:  Negative for abdominal pain, constipation, diarrhea, heartburn, nausea and vomiting.  Genitourinary:  Negative for dysuria, frequency and urgency.  Neurological:  Negative for dizziness and headaches.  Endo/Heme/Allergies:  Negative for polydipsia.  Psychiatric/Behavioral:  Negative for depression and suicidal ideas. The patient is not nervous/anxious.       Objective:     BP 120/60   Pulse 86   Temp 98.3 F (36.8 C) (Oral)   Ht 5' 4 (1.626 m)   Wt 145 lb 3.2 oz (65.9 kg)   SpO2 97%   BMI 24.92 kg/m  BP Readings from Last 3 Encounters:  11/25/23 120/60  11/11/23 (!)  124/50  10/12/23 137/66   Wt Readings from Last 3 Encounters:  11/25/23 145 lb 3.2 oz (65.9 kg)  11/11/23 146 lb 9.6 oz (66.5 kg)  10/12/23 151 lb 6.4 oz (68.7 kg)      Physical Exam Vitals and nursing note reviewed.  Constitutional:      Appearance: Normal appearance.  Cardiovascular:     Rate and Rhythm: Normal rate and regular rhythm.     Pulses: Normal pulses.     Heart sounds: Normal heart sounds.  Pulmonary:     Effort: Pulmonary effort is normal.     Breath sounds: Normal breath sounds.  Neurological:     Mental Status: She is alert and oriented to person, place, and time.  Psychiatric:        Mood and Affect: Mood normal.        Behavior: Behavior normal.        Thought Content: Thought content normal.        Judgment: Judgment normal.      No results found for any visits on 11/25/23.     The 10-year ASCVD risk score (Arnett DK, et al., 2019) is: 27%    Assessment & Plan:  HTN (hypertension), benign -     Lisinopril ; Take 1 tablet (20 mg total) by mouth daily.  Dispense: 90 tablet; Refill: 1 -     Basic metabolic panel with GFR  Type 2 diabetes mellitus with diabetic neuropathy, unspecified whether long term insulin  use (HCC) -     metFORMIN  HCl ER; Take 2 tablets (1,000 mg total) by mouth 2 (two) times daily with a meal.  Dispense: 360 tablet; Refill: 0  Depression, recurrent (HCC) -     Venlafaxine  HCl ER; Take 1 capsule (150 mg total) by mouth daily with breakfast.  Dispense: 90 capsule; Refill: 1  Mixed hyperlipidemia -     Ezetimibe ; Take 1 tablet (10 mg total) by mouth at bedtime.  Dispense: 90 tablet; Refill: 1 -     Pravastatin  Sodium; Take 1 tablet (80 mg total) by mouth at bedtime.  Dispense: 90 tablet; Refill: 1  Dizziness  Chronic nonintractable headache, unspecified headache type  Cerebrovascular small vessel disease    Assessment and Plan Assessment & Plan Chronic dizziness and imbalance due to antihypertensive therapy  adjustment Dizziness and imbalance improved after discontinuing hydrochlorothiazide and adjusting lisinopril . Blood pressure stable with one hypotensive episode. - BMP pending. - scheduled to see cardiologist on 9/22  Essential hypertension Blood pressure well-controlled with lisinopril  monotherapy. No significant hypotension reported. - Continue lisinopril  monotherapy. - Send lisinopril  prescription to [facility] for a 90-day supply.  Type 2 diabetes mellitus Diabetes management requires continuation of metformin . - Send metformin  prescription to [facility] for a 90-day supply.  Hyperlipidemia Slightly elevated cholesterol levels. Diet and exercise recommended with medication. - Continue Zetia  and pravastatin . - Send prescriptions for Zetia  and pravastatin  to [facility].  Chronic small vessel cerebrovascular disease Chronic small vessel disease noted on MRI, likely due to age. No acute changes noted.  Chronic headaches Headaches improved with increased gabapentin . Right-sided headaches since 2022. Neurologist increased gabapentin  to 300 mg twice daily. - Continue current gabapentin  regimen as per neurologist's instructions.  Depression Depression stable on venlafaxine  150 mg daily. - Send venlafaxine  prescription to [facility] for a 90-day supply.   Return in about 3 months (around 02/24/2024) for chronic care management. SABRA Carrol [place], NP

## 2023-11-25 NOTE — Patient Instructions (Addendum)
 Stop by the lab prior to leaving today. I will notify you of your results once received.   Follow up with neurologist regarding the gabapentin  and let me know if I need to send it in.   I have sent the refills for Metformin  to [facility].  I have sent the refills for Zetia , Pravastatin , Venlafaxine  and Lisinopril  were all sent to [facility].   Follow up in 3 months.   It was a pleasure to see you today!

## 2023-11-27 ENCOUNTER — Ambulatory Visit: Admitting: Physical Therapy

## 2023-11-27 DIAGNOSIS — R2689 Other abnormalities of gait and mobility: Secondary | ICD-10-CM | POA: Diagnosis not present

## 2023-11-27 DIAGNOSIS — R262 Difficulty in walking, not elsewhere classified: Secondary | ICD-10-CM

## 2023-11-27 DIAGNOSIS — R293 Abnormal posture: Secondary | ICD-10-CM

## 2023-11-27 DIAGNOSIS — M6281 Muscle weakness (generalized): Secondary | ICD-10-CM

## 2023-11-27 NOTE — Therapy (Signed)
 OUTPATIENT PHYSICAL THERAPY NEURO    Patient Name: Bianca Rogers MRN: [id number] DOB:1950-06-16, 73 y.o., female Today's Date: 11/27/2023  PCP: Vincente Shivers, NP  REFERRING PROVIDER: Lane Arthea BRAVO, MD   END OF SESSION:  PT End of Session - 11/27/23 0922     Visit Number 5    Number of Visits 25    Date for Recertification  01/28/24    Authorization Type human[employer] medicare    Authorization Time Period 11/05/23-02/07/24 24 visits    Authorization - Number of Visits 24    Progress Note Due on Visit 10    PT Start Time 0925    PT Stop Time 1008    PT Time Calculation (min) 43 min    Equipment Utilized During Treatment Gait belt    Activity Tolerance Patient tolerated treatment well;No increased pain    Behavior During Therapy [facility] for tasks assessed/performed           Past Medical History:  Diagnosis Date   Anal fissure 12/09/2016   Added automatically from request for surgery [id number]    Added automatically from request for surgery [id number]     Arthralgia of left knee 12/03/2020   Asthma 08/29/2022   Chronic/stable.    Follows with Dr. Elayne, pulmonology.   Using Flovent  and albuterol  inhaler as needed.     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     Heart murmur    History of total knee arthroplasty 08/21/2021   Hyperlipidemia 08/29/2022   Lab Results   Component Value Date    Cholesterol 197 01/28/2022      Lab Results   Component Value Date    HDL 56 01/28/2022      Lab Results   Component Value Date    LDL Calculated 115 01/28/2022      Lab Results   Component Value Date    Triglycerides 131 01/28/2022      Lab  Results   Component Value Date    Chol/HDL Ratio 3.5 01/28/2022      Criteria used to determine recommendation include:    Hypertension    Invasive carcinoma of breast (HCC)    Liver cirrhosis secondary to NASH (HCC)    Lung nodule    Major depressive disorder in full remission (HCC) 04/02/2020   Mood stable on venlafaxine  150 mg daily.     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 breast of female, estrogen receptor positive (HCC)    Migraine 08/29/2022   NAFLD (nonalcoholic fatty liver disease) 92/76/7985   Non-alcoholic cirrhosis (HCC)    Osteoarthritis of left knee 09/03/2020   Peritoneal hematoma 07/07/2019   Sprain of MCL (medial collateral ligament) of knee 09/03/2020   Symptom associated with female genital organs 07/09/2010   Past Surgical History:  Procedure Laterality Date   ABDOMINAL HYSTERECTOMY     AXILLARY SENTINEL NODE BIOPSY Left 04/22/2023   Procedure: AXILLARY SENTINEL NODE BIOPSY;  Surgeon: Lane Shope, MD;  Location: [facility] ORS;  Service: General;  Laterality: Left;   BREAST BIOPSY Left 03/31/2023   Us  Core bx, coil clip - path pending   BREAST  BIOPSY Left 03/31/2023   US  LT BREAST BX W LOC DEV 1ST LESION IMG BX SPEC US  GUIDE 03/31/2023 [facility]   BREAST BIOPSY Left 04/20/2023   US  LT RADIO FREQUENCY TAG LOC US  GUIDE 04/20/2023 [facility]   BREAST LUMPECTOMY WITH RADIOFREQUENCY TAG IDENTIFICATION Left 04/22/2023   Procedure: BREAST LUMPECTOMY WITH RADIOFREQUENCY TAG IDENTIFICATION;  Surgeon: Lane Shope, MD;  Location: [facility] ORS;  Service: General;  Laterality: Left;   CARDIAC CATHETERIZATION  03/10/2012   Negative   CATARACT EXTRACTION Bilateral    CESAREAN SECTION     CHOLECYSTECTOMY     RE-EXCISION OF BREAST LUMPECTOMY Left 05/20/2023   Procedure: EXCISION, LESION, BREAST, REPEAT;  Surgeon: Lane Shope, MD;  Location: [facility] ORS;  Service: General;  Laterality: Left;   SPLENECTOMY     TONSILLECTOMY     Patient Active Problem  List   Diagnosis Date Noted   Cerebrovascular small vessel disease 11/25/2023   Chronic nonintractable headache 09/08/2023   Osteopenia 05/27/2023   Genetic testing 05/15/2023   Change in facial mole 04/17/2023   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 breast of female, estrogen receptor positive (HCC) 04/09/2023   Invasive carcinoma of breast (HCC) 04/08/2023   Family history of breast cancer 04/08/2023   Gastroesophageal reflux disease 03/02/2023   Liver cirrhosis secondary to NASH (HCC) 03/02/2023   Bronchiectasis (HCC) 06/09/2013   Type 2 diabetes mellitus with diabetic neuropathy (HCC) 06/09/2013   HTN (hypertension), benign 06/09/2013    ONSET DATE: 10/09/23  REFERRING DIAG: R29.6 (ICD-10-CM) - Frequent falls   THERAPY DIAG:  Difficulty in walking, not elsewhere classified  Muscle weakness (generalized)  Imbalance  Abnormal posture  Rationale for Evaluation and Treatment: Rehabilitation  SUBJECTIVE:                                                                                                                                                                                             SUBJECTIVE STATEMENT:  Pt reports that she is feeling better today. Has been continuing to feel better.  Pt accompanied by: self  PERTINENT HISTORY:   Pt reports she has had PT in [place] before for her neck and after her L TKA.  Pt reports that she has been having neck pain and headaches to the point where she will be having an MRI tomorrow.  Pt reports having 2 falls (August 2nd, August 6th).  Pt notes that the first fall she was trying to get out of the recliner and she fell forward.  Pt reports on the second fall, she missed a step. Pt reports she feels like  she has POTS, and that it runs in her family as well (granddaughte,r and grandson).   Pt had gabapentin  increased from 1 time in the morning and 1 time at night, to 2 at each time frame,  and then recently switched to 3 in the morning and 3 at night. Pt reports she was told by MD to ambulate with a walker, however she has not been doing that.  PMH: asthma, CAD, DM2, heart murmur, L TKA, HTN, Depressive disorder,migraines  PAIN:  Are you having pain? 6/10 Rt hemi headache  PRECAUTIONS: Pt has hx of breast cancer and finished radiation back in May.    WEIGHT BEARING RESTRICTIONS: No  FALLS: Has patient fallen in last 6 months? Yes. Number of falls 2  LIVING ENVIRONMENT: Lives with: mother lives with pt Lives in: Apartment Stairs: Elevator Has following equipment at home: Single point cane and Environmental consultant - 2 wheeled  PLOF: Independent  PATIENT GOALS: Being less wobbly and feel more balances.  OBJECTIVE:  Note: Objective measures were completed at Evaluation unless otherwise noted.  DIAGNOSTIC FINDINGS: Pt to have an MRI tomorrow.    COGNITION: Overall cognitive status: Within functional limits for tasks assessed   SENSATION: WFL, how pt notes burning in the bottom of her feet.  Pt reports she has neuropathy in the feet.  COORDINATION: Not formally tested   ORTHOSTATICS:  Eval Supine - BP: 131/52  mmHG  Sitting - BP: 112/55 mmHg  Standing - BP: 96/53 mmHg  Standing +3 Min - BP: 133/70 mmHg    11/18/23  117/97mmHg 78bpm  supine  102/31mmHg 80bpm seated 0 118/72mmHg 84bpm seated x1 minute 87/29mmHg 84bpm standing x0 minutes 104/64mmHg 88bpm standing x1 minute 105/2mmHg 87bpm standing x2 minute 110/64mmHg 90bpm standing post [id number]    9/12:  Seated 121/63 HR 68bpm  Standing 0 min 120/60 HR79  Standing 1 min 127/69 HR 86  Following gait for 110ft 126/56 HR82     9/17:  Seated: 115/61 HR 76 Standing: 81/49 HR 79 mild lightheadedness Standing 1 min 101/60 HR 87  reduced s/s but still slightly present  FUNCTIONAL TESTS:  5 times sit to stand: 19.04 sec Timed up and go (TUG): 14.68 sec 6 minute walk test:  9/10: tolerates on 430ft over 35m26sec  before legs before increasingly ataxic  9/12: gait training with use of 4WW; tolerated 621ft, for 4:32, no significant ataxia noted, and only mild pain in sacrum area.  10 meter walk test: 15.32 sec; 0.65 m/s Dynamic Gait Index   PATIENT SURVEYS:  ABC Scale:  64% (9/10)                                                                                                                               TREATMENT DATE: 11/27/23  TA- To improve functional movements patterns for everyday tasks   Gait with 4WW 300 ft x 3 with 2#AW   alternating with 5 x STS no UE between  reps   Step up x 10 ea LE   NMR: To facilitate reeducation of movement, balance, posture, coordination, and/or proprioception/kinesthetic sense.  Adducted stance on airex 2 x 1 min- min sway  1LE on airex other on step 3 x 45 sec ea  Step tap to 4 inch step 2 x 10 reps no UE support for dynamic SLS control with 2#AW   Pt required occasional rest breaks due fatigue, PT was attentive to when pt appeared to be tired or winded in order to prevent excessive fatigue.     PATIENT EDUCATION: Education details: Pt educated on role of PT and services provided during current POC, along with prognosis and information about the clinic.  Person educated: Patient Education method: Explanation Education comprehension: verbalized understanding  HOME EXERCISE PROGRAM: TBD at subsequent visit  GOALS: Goals reviewed with patient? Yes  SHORT TERM GOALS: Target date: 12/03/2023   Pt will be independent with HEP in order to demonstrate increased ability to perform tasks related to occupation/hobbies. Baseline: to be given at subsequent visit Goal status: INITIAL   LONG TERM GOALS: Target date: 01/28/2024  1.  Patient (> 36 years old) will complete five times sit to stand test in < 15 seconds indicating an increased LE strength and improved balance. Baseline: 19.04 sec Goal status: INITIAL  2.  Pt will improve ABC by at least 13% in  order to demonstrate clinically significant improvement in balance confidence.  Baseline: TBD Goal status: INITIAL   3.  Pt will improve DGI by at least 3 points in order to demonstrate clinically significant improvement in balance and decreased risk for falls. Baseline: TBD Goal status: INITIAL   4.  Patient will reduce timed up and go to <11 seconds to reduce fall risk and demonstrate improved transfer/gait ability. Baseline: 14.68 sec Goal status: INITIAL  5.  Patient will increase 10 meter walk test to >1.48m/s as to improve gait speed for better community ambulation and to reduce fall risk. Baseline: 15.32 sec; 0.65 m/s Goal status: INITIAL  6.  Patient will increase six minute walk test distance to >1000 for progression to community ambulator and improve gait ability Baseline: 9/10: tolerates on 440ft over 93m26sec before legs before increasingly ataxic  Goal status: INITIAL   ASSESSMENT:  CLINICAL IMPRESSION:  Patient arrived with good motivation for completion of pt activities.  Pt having improved response to standing activities this date with no reports of dizziness. Pt progresses with endurance and balance activities this date. Pt still requires frequent rest d/t fatigue. Pt will continue to benefit from skilled physical therapy intervention to address impairments, improve QOL, and attain therapy goals.    OBJECTIVE IMPAIRMENTS: Abnormal gait, decreased balance, decreased mobility, difficulty walking, dizziness, and improper body mechanics.   ACTIVITY LIMITATIONS: lifting, sitting, standing, stairs, transfers, reach over head, and locomotion level  PARTICIPATION LIMITATIONS: meal prep, cleaning, laundry, driving, shopping, community activity, and yard work  PERSONAL FACTORS: Age, Time since onset of injury/illness/exacerbation, and 3+ comorbidities: asthma, CAD, DM2, heart murmur, L TKA, HTN, Depressive disorder,migraines are also affecting patient's functional outcome.    REHAB POTENTIAL: Good  CLINICAL DECISION MAKING: Evolving/moderate complexity  EVALUATION COMPLEXITY: Moderate  PLAN:  PT FREQUENCY: 2x/week  PT DURATION: 12 weeks  PLANNED INTERVENTIONS: 97750- Physical Performance Testing, 97110-Therapeutic exercises, 97530- Therapeutic activity, [id number]- Neuromuscular re-education, 97535- Self Care, [id number]- Manual therapy, [id number]- Gait training, [phone]- Canalith repositioning, Balance training, and Stair training  PLAN FOR NEXT SESSION:  -vitals as needed -FU on fluid/electrolyte  intake Gait with 4WW to increase tolerance and variable training for ataxia management.    Note: Portions of this document were prepared using Dragon voice recognition software and although reviewed may contain unintentional dictation errors in syntax, grammar, or spelling.  Lonni KATHEE Gainer PT ,DPT Physical Therapist- [facility]  [facility]   9:23 AM 11/27/23

## 2023-11-29 DIAGNOSIS — R42 Dizziness and giddiness: Secondary | ICD-10-CM | POA: Insufficient documentation

## 2023-11-29 NOTE — Progress Notes (Unsigned)
 Cardiology Office Note  Date:  11/30/2023   ID:  Bianca, Rogers 10-07-1950, MRN [id number]  PCP:  Bianca Shivers, NP   Chief Complaint  Patient presents with   New Patient (Initial Visit)    Ref by Rogers Vincente, NP for dizziness and chest pain.      HPI:  Bianca Rogers a 73 y.o. femalewith past medical history of: Breast cancer, surgery x 2, XRT, no chemo Liver cirrhosis secondary to NASH GERD Bronchiectasis Diabetes type 2 Headaches on Neurontin  Depression on Effexor  Hyperlipidemia Chronic h/a dating back to 2022 Who presents by referral from Rogers Bianca for dizziness, atypical chest pain  Reports dizziness going back weeks/months Gait unstability, chronic, dating back quite some time Gait worse recently Dizzy when standing and walking Not working with PT, feels her symptoms are slowly improving  Migraine h/a worse recently Off tylenol /IBP: I was over dosing Since medication changes for BP, h/a improving Higher gabapentin  helped Still with h/a on right, not migraine  Off hydrochlorothiazide, on lisinopril  alone Blood pressure stable at home : 120s/60-70s  Brain MRI Mild to moderate for age signal changes in the pons are nonspecific but most commonly due to chronic small vessel disease.  Chest pain Sharp, often, hurts to push on it several months Not associated with exertion  Lab work reviewed Prerenal state with elevated BUN and creatinine October 12, 2023 Off HCTZ, renal function is normalized Creatinine 0.93 BUN 16  Lab work reviewed A1c 6.7 Total cholesterol 171 LDL 88  EKG personally reviewed by myself on todays visit EKG Interpretation Date/Time:  Monday November 30 2023 13:59:41 EDT Ventricular Rate:  76 PR Interval:  148 QRS Duration:  86 QT Interval:  384 QTC Calculation: 432 R Axis:   -47  Text Interpretation: Normal sinus rhythm Left anterior fascicular block Minimal voltage criteria for LVH, may be normal variant ( R in aVL )  When compared with ECG of 16-Apr-2023 10:07, No significant change was found Confirmed by Perla Lye [phone]) on 11/30/2023 2:08:55 PM    PMH:   has a past medical history of Anal fissure (12/09/2016), Arthralgia of left knee (12/03/2020), Asthma (08/29/2022), Bartholin cyst (12/11/2015), Bronchiectasis (HCC), Carotid artery disease (HCC) (04/02/2020), Cervical radiculitis (01/09/2022), COVID (02/28/2021), Diabetes mellitus without complication (HCC), Dyspnea (06/09/2013), Encounter to establish care (03/02/2023), GERD (gastroesophageal reflux disease), H/O splenectomy (10/18/2020), Heart murmur, History of total knee arthroplasty (08/21/2021), Hyperlipidemia (08/29/2022), Hypertension, Invasive carcinoma of breast (HCC), Liver cirrhosis secondary to NASH [facility]), Lung nodule, 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  PSH:    Past Surgical History:  Procedure Laterality Date   ABDOMINAL HYSTERECTOMY     AXILLARY SENTINEL NODE BIOPSY Left 04/22/2023   Procedure: AXILLARY SENTINEL NODE BIOPSY;  Surgeon: Lane Shope, MD;  Location: [facility];  Service: General;  Laterality: Left;   BREAST BIOPSY Left 03/31/2023   Us  Core bx, coil clip - path pending   BREAST BIOPSY Left 03/31/2023   US  LT BREAST BX W LOC DEV 1ST LESION IMG BX SPEC US  GUIDE 03/31/2023 [facility]   BREAST BIOPSY Left 04/20/2023   US  LT RADIO FREQUENCY TAG LOC US  GUIDE 04/20/2023 [facility]   BREAST LUMPECTOMY WITH RADIOFREQUENCY TAG  IDENTIFICATION Left 04/22/2023   Procedure: BREAST LUMPECTOMY  WITH RADIOFREQUENCY TAG IDENTIFICATION;  Surgeon: Lane Shope, MD;  Location: [facility];  Service: General;  Laterality: Left;   CARDIAC CATHETERIZATION  03/10/2012   Negative   CATARACT EXTRACTION Bilateral    CESAREAN SECTION     CHOLECYSTECTOMY     RE-EXCISION OF BREAST LUMPECTOMY Left 05/20/2023   Procedure: EXCISION, LESION, BREAST, REPEAT;  Surgeon: Lane Shope, MD;  Location: [facility];  Service: General;  Laterality: Left;   SPLENECTOMY     TONSILLECTOMY      Current Outpatient Medications  Medication Sig Dispense Refill   aspirin  (ASPIRIN  81) 81 MG chewable tablet Chew 1 tablet (81 mg total) by mouth daily.     Calcium Carbonate (CALCIUM 500 PO) Take 500 mg by mouth in the morning and at bedtime.     ezetimibe  (ZETIA ) 10 MG tablet Take 1 tablet (10 mg total) by mouth at bedtime. 90 tablet 1   fluticasone -salmeterol (WIXELA INHUB) 250-50 MCG/ACT AEPB Inhale 1 puff into the lungs in the morning and at bedtime. 60 each 12   gabapentin  (NEURONTIN ) 100 MG capsule Take 1 capsule (100 mg total) by mouth 2 (two) times daily. 120 capsule 0   lisinopril  (ZESTRIL ) 20 MG tablet Take 1 tablet (20 mg total) by mouth daily. 90 tablet 1   loratadine (CLARITIN) 10 MG tablet Take 10 mg by mouth daily as needed for allergies.     metFORMIN  (GLUCOPHAGE -XR) 500 MG 24 hr tablet Take 2 tablets (1,000 mg total) by mouth 2 (two) times daily with a meal. 360 tablet 0   omeprazole  (PRILOSEC) 20 MG capsule TAKE 1 CAPSULE (20 MG TOTAL) BY MOUTH DAILY. 90 capsule 1   pravastatin  (PRAVACHOL ) 80 MG tablet Take 1 tablet (80 mg total) by mouth at bedtime. 90 tablet 1   venlafaxine  XR (EFFEXOR  XR) 150 MG 24 hr capsule Take 1 capsule (150 mg total) by mouth daily with breakfast. 90 capsule 1   VITAMIN D, CHOLECALCIFEROL, PO Take 2,000 Units by mouth daily.     albuterol  (PROVENTIL  HFA) 108 (90 BASE) MCG/ACT inhaler Inhale 1 puff into the lungs every 6 (six) hours as needed for wheezing or shortness of breath.  (Patient not taking: Reported on 11/30/2023)     clobetasol  cream (TEMOVATE ) 0.05 % Apply 1 Application topically 2 (two) times daily. (Patient not taking: Reported on 11/30/2023) 60 g 0   tamoxifen  (NOLVADEX ) 20 MG tablet Take 1 tablet (20 mg total) by mouth daily. (Patient not taking: Reported on 11/30/2023) 90 tablet 3   No current facility-administered medications for this visit.    Allergies:   Penicillins, Zoloft [sertraline hcl], Codeine, and Sertraline   Social History:  The patient  reports that she quit smoking about 40 years ago. Her smoking use included cigarettes. She started smoking about 47 years ago. She has a 14.5 pack-year smoking history. She has never used smokeless tobacco. She reports that she does not drink alcohol and does not use drugs.   Family History:   family history includes Arthritis in her father, mother, paternal grandfather, and sister; Asthma in her father; Birth defects in her sister; Breast cancer (age of onset: 20) in her niece; Breast cancer (age of onset: 83) in her daughter; Breast cancer (age of onset: 76) in her sister; COPD in her father; Cancer in her paternal grandfather and sister; Cancer (age of onset: 67) in her father; Cervical cancer in her daughter; Depression in her father; Diabetes in  her paternal grandmother and sister; Early death in her father; Hearing loss in her father and mother; Heart disease in her father; High Cholesterol in her father and mother; High blood pressure in her mother and sister; Miscarriages / Stillbirths in her mother; Stroke in her mother; Thyroid disease in her mother; Transient ischemic attack in her mother.    Review of Systems: Review of Systems  Constitutional: Negative.   HENT: Negative.    Respiratory: Negative.    Cardiovascular: Negative.   Gastrointestinal: Negative.   Musculoskeletal: Negative.   Neurological: Negative.   Psychiatric/Behavioral: Negative.    All other systems reviewed and are  negative.   PHYSICAL EXAM: VS:  BP (!) 158/74 (BP Location: Right Arm, Patient Position: Sitting, Cuff Size: Normal)   Pulse 76   Ht 5' 4 (1.626 m)   Wt 154 lb (69.9 kg)   SpO2 97%   BMI 26.43 kg/m  , BMI Body mass index is 26.43 kg/m. GEN: Well nourished, well developed, in no acute distress HEENT: normal Neck: no JVD, carotid bruits, or masses Cardiac: RRR; no murmurs, rubs, or gallops,no edema  Respiratory:  clear to auscultation bilaterally, normal work of breathing GI: soft, nontender, nondistended, + BS MS: no deformity or atrophy Skin: warm and dry, no rash Neuro:  Strength and sensation are intact Psych: euthymic mood, full affect   Recent Labs: 10/12/2023: ALT 30 11/11/2023: Hemoglobin 11.4; Platelets 331.0 11/25/2023: BUN 16; Creatinine, Ser 0.93; Potassium 3.9; Sodium 140    Lipid Panel Lab Results  Component Value Date   CHOL 171 09/08/2023   HDL 49.20 09/08/2023   LDLCALC 88 09/08/2023   TRIG 172.0 (H) 09/08/2023      Wt Readings from Last 3 Encounters:  11/30/23 154 lb (69.9 kg)  11/25/23 145 lb 3.2 oz (65.9 kg)  11/11/23 146 lb 9.6 oz (66.5 kg)     ASSESSMENT AND PLAN:  Problem List Items Addressed This Visit       Cardiology Problems   HTN (hypertension), benign (Chronic)   Relevant Orders   EKG 12-Lead (Completed)     Other   Bronchiectasis (HCC) (Chronic)   Dizziness - Primary   Relevant Orders   EKG 12-Lead (Completed)   Type 2 diabetes mellitus with diabetic neuropathy (HCC)   Liver cirrhosis secondary to NASH (HCC)   HTN BP well controlled at home  H/A Staying hydrated Does eleloytres Increased gabapentin  helped  Neuropathy Better with gabapentin   Gait instability/dizziness Doing PT Brain MRI: no CVA BP not low, no orthostasis Less likely arrhythmia Dizzy standing up in the exam room, mainly gait instability  Chest pain Sharp, often, hurts to push on it several months Atypical, less likely secondary to  ischemia Will hold off on ischemic testing, echo has been ordered  Murmur: Echo ordered Likely aortic valve sclerosis, possible stenosis  Signed, Velinda Lunger, M.D., Ph.D. [facility] [place], [place] [phone]

## 2023-11-30 ENCOUNTER — Ambulatory Visit: Attending: Cardiovascular Disease | Admitting: Cardiovascular Disease

## 2023-11-30 VITALS — BP 158/74 | HR 76 | Ht 64.0 in | Wt 154.0 lb

## 2023-11-30 DIAGNOSIS — I10 Essential (primary) hypertension: Secondary | ICD-10-CM

## 2023-11-30 DIAGNOSIS — E1140 Type 2 diabetes mellitus with diabetic neuropathy, unspecified: Secondary | ICD-10-CM | POA: Diagnosis not present

## 2023-11-30 DIAGNOSIS — R42 Dizziness and giddiness: Secondary | ICD-10-CM

## 2023-11-30 DIAGNOSIS — J479 Bronchiectasis, uncomplicated: Secondary | ICD-10-CM | POA: Diagnosis not present

## 2023-11-30 DIAGNOSIS — K7460 Unspecified cirrhosis of liver: Secondary | ICD-10-CM

## 2023-11-30 DIAGNOSIS — K7581 Nonalcoholic steatohepatitis (NASH): Secondary | ICD-10-CM | POA: Diagnosis not present

## 2023-11-30 NOTE — Patient Instructions (Addendum)

## 2023-12-02 ENCOUNTER — Ambulatory Visit: Admitting: Physical Therapy

## 2023-12-02 DIAGNOSIS — R262 Difficulty in walking, not elsewhere classified: Secondary | ICD-10-CM

## 2023-12-02 DIAGNOSIS — R2689 Other abnormalities of gait and mobility: Secondary | ICD-10-CM | POA: Diagnosis not present

## 2023-12-02 DIAGNOSIS — M6281 Muscle weakness (generalized): Secondary | ICD-10-CM

## 2023-12-02 DIAGNOSIS — R293 Abnormal posture: Secondary | ICD-10-CM

## 2023-12-02 NOTE — Therapy (Signed)
 OUTPATIENT PHYSICAL THERAPY NEURO    Patient Name: Bianca Rogers MRN: [id number] DOB:1950/08/23, 73 y.o., female Today's Date: 12/02/2023  PCP: Vincente Shivers, NP  REFERRING PROVIDER: Lane Arthea BRAVO, MD   END OF SESSION:  PT End of Session - 12/02/23 0849     Visit Number 6    Number of Visits 25    Date for Recertification  01/28/24    Authorization Type [employer]    Authorization Time Period 11/05/23-02/07/24 24 visits    Authorization - Number of Visits 24    Progress Note Due on Visit 10    PT Start Time 0845    PT Stop Time 0927    PT Time Calculation (min) 42 min    Equipment Utilized During Treatment Gait belt    Activity Tolerance Patient tolerated treatment well;No increased pain    Behavior During Therapy [facility] for tasks assessed/performed            Past Medical History:  Diagnosis Date   Anal fissure 12/09/2016   Added automatically from request for surgery [id number]    Added automatically from request for surgery [id number]     Arthralgia of left knee 12/03/2020   Asthma 08/29/2022   Chronic/stable.    Follows with Dr. Elayne, pulmonology.   Using Flovent  and albuterol  inhaler as needed.     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     Heart murmur    History of total knee arthroplasty 08/21/2021   Hyperlipidemia 08/29/2022   Lab Results   Component Value Date    Cholesterol 197 01/28/2022      Lab Results   Component Value Date    HDL 56 01/28/2022      Lab Results   Component Value Date    LDL Calculated 115 01/28/2022      Lab Results   Component Value Date    Triglycerides 131 01/28/2022      Lab Results    Component Value Date    Chol/HDL Ratio 3.5 01/28/2022      Criteria used to determine recommendation include:    Hypertension    Invasive carcinoma of breast (HCC)    Liver cirrhosis secondary to NASH (HCC)    Lung nodule    Major depressive disorder in full remission 04/02/2020   Mood stable on venlafaxine  150 mg daily.     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 breast of female, estrogen receptor positive (HCC)    Migraine 08/29/2022   NAFLD (nonalcoholic fatty liver disease) 92/76/7985   Non-alcoholic cirrhosis (HCC)    Osteoarthritis of left knee 09/03/2020   Peritoneal hematoma 07/07/2019   Sprain of MCL (medial collateral ligament) of knee 09/03/2020   Symptom associated with female genital organs 07/09/2010   Past Surgical History:  Procedure Laterality Date   ABDOMINAL HYSTERECTOMY     AXILLARY SENTINEL NODE BIOPSY Left 04/22/2023   Procedure: AXILLARY SENTINEL NODE BIOPSY;  Surgeon: Lane Shope, MD;  Location: [facility];  Service: General;  Laterality: Left;   BREAST BIOPSY Left 03/31/2023   Us  Core bx, coil clip - path pending   BREAST BIOPSY  Left 03/31/2023   US  LT BREAST BX W LOC DEV 1ST LESION IMG BX SPEC US  GUIDE 03/31/2023 [facility]   BREAST BIOPSY Left 04/20/2023   US  LT RADIO FREQUENCY TAG LOC US  GUIDE 04/20/2023 [facility]   BREAST LUMPECTOMY WITH RADIOFREQUENCY TAG IDENTIFICATION Left 04/22/2023   Procedure: BREAST LUMPECTOMY WITH RADIOFREQUENCY TAG IDENTIFICATION;  Surgeon: Lane Shope, MD;  Location: [facility];  Service: General;  Laterality: Left;   CARDIAC CATHETERIZATION  03/10/2012   Negative   CATARACT EXTRACTION Bilateral    CESAREAN SECTION     CHOLECYSTECTOMY     RE-EXCISION OF BREAST LUMPECTOMY Left 05/20/2023   Procedure: EXCISION, LESION, BREAST, REPEAT;  Surgeon: Lane Shope, MD;  Location: [facility];  Service: General;  Laterality: Left;   SPLENECTOMY     TONSILLECTOMY     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 breast of female, estrogen receptor positive (HCC) 04/09/2023   Invasive carcinoma of breast (HCC) 04/08/2023   Family history of breast cancer 04/08/2023   Gastroesophageal reflux disease 03/02/2023   Liver cirrhosis secondary to NASH (HCC) 03/02/2023   Bronchiectasis (HCC) 06/09/2013   Type 2 diabetes mellitus with diabetic neuropathy (HCC) 06/09/2013   HTN (hypertension), benign 06/09/2013    ONSET DATE: 10/09/23  REFERRING DIAG: R29.6 (ICD-10-CM) - Frequent falls   THERAPY DIAG:  Difficulty in walking, not elsewhere classified  Muscle weakness (generalized)  Imbalance  Abnormal posture  Rationale for Evaluation and Treatment: Rehabilitation  SUBJECTIVE:                                                                                                                                                                                             SUBJECTIVE STATEMENT:  Pt reports that she is feeling better today. A little wobbly coming in but overall she is feeling good.   Pt accompanied by: self  PERTINENT HISTORY:   Pt reports she has had PT in [place] before for her neck and after her L TKA.  Pt reports that she has been having neck pain and headaches to the point where she will be having an MRI tomorrow.  Pt reports having 2 falls (August 2nd, August 6th).  Pt notes that the first fall she was trying to get out of the recliner and she fell forward.  Pt reports on the second fall,  she missed a step. Pt reports she feels like she has POTS, and that it runs in her family as well (granddaughte,r and grandson).   Pt had gabapentin  increased from 1 time in the morning and 1  time at night, to 2 at each time frame, and then recently switched to 3 in the morning and 3 at night. Pt reports she was told by MD to ambulate with a walker, however she has not been doing that.  PMH: asthma, CAD, DM2, heart murmur, L TKA, HTN, Depressive disorder,migraines  PAIN:  Are you having pain? 6/10 Rt hemi headache  PRECAUTIONS: Pt has hx of breast cancer and finished radiation back in May.    WEIGHT BEARING RESTRICTIONS: No  FALLS: Has patient fallen in last 6 months? Yes. Number of falls 2  LIVING ENVIRONMENT: Lives with: mother lives with pt Lives in: Apartment Stairs: Elevator Has following equipment at home: Single point cane and Environmental consultant - 2 wheeled  PLOF: Independent  PATIENT GOALS: Being less wobbly and feel more balances.  OBJECTIVE:  Note: Objective measures were completed at Evaluation unless otherwise noted.  DIAGNOSTIC FINDINGS: Pt to have an MRI tomorrow.    COGNITION: Overall cognitive status: Within functional limits for tasks assessed   SENSATION: WFL, how pt notes burning in the bottom of her feet.  Pt reports she has neuropathy in the feet.  COORDINATION: Not formally tested   ORTHOSTATICS:  Eval Supine - BP: 131/52  mmHG  Sitting - BP: 112/55 mmHg  Standing - BP: 96/53 mmHg  Standing +3 Min - BP: 133/70 mmHg    11/18/23  117/80mmHg 78bpm  supine  102/58mmHg 80bpm seated 0 118/3mmHg 84bpm seated x1 minute 87/11mmHg 84bpm standing x0 minutes 104/37mmHg 88bpm standing x1 minute 105/70mmHg 87bpm standing x2 minute 110/60mmHg 90bpm standing post [id number]    9/12:  Seated 121/63 HR 68bpm  Standing 0 min 120/60 HR79  Standing 1 min 127/69 HR 86  Following gait for 138ft 126/56 HR82     9/17:  Seated: 115/61 HR 76 Standing: 81/49 HR 79 mild lightheadedness Standing 1 min 101/60 HR 87  reduced s/s but still slightly present  9/24: Seated 132/64 HR 67  Standing : 116/61 HR 70   Standing 1 min: 131/63 HR 72  FUNCTIONAL TESTS:  5  times sit to stand: 19.04 sec Timed up and go (TUG): 14.68 sec 6 minute walk test:  9/10: tolerates on 446ft over 36m26sec before legs before increasingly ataxic  9/12: gait training with use of 4WW; tolerated 610ft, for 4:32, no significant ataxia noted, and only mild pain in sacrum area.  10 meter walk test: 15.32 sec; 0.65 m/s Dynamic Gait Index   PATIENT SURVEYS:  ABC Scale:  64% (9/10)                                                                                                                               TREATMENT DATE: 12/02/23  TA- To improve functional movements patterns for everyday tasks   Gait with 4WW 300 ft x 2 with 2#AW   alternating with 5 x STS no UE between reps   Step up 2x 10 ea LE no UEW assist, CGA  NMR: To facilitate reeducation of movement, balance, posture, coordination, and/or proprioception/kinesthetic sense.   Airex march x 20   Airex lateral step up and down x 10 ea side   Pt required occasional rest breaks due fatigue, PT was attentive to when pt appeared to be tired or winded in order to prevent excessive fatigue.     PATIENT EDUCATION: Education details: Pt educated on role of PT and services provided during current POC, along with prognosis and information about the clinic.  Person educated: Patient Education method: Explanation Education comprehension: verbalized understanding  HOME EXERCISE PROGRAM: TBD at subsequent visit  GOALS: Goals reviewed with patient? Yes  SHORT TERM GOALS: Target date: 12/03/2023   Pt will be independent with HEP in order to demonstrate increased ability to perform tasks related to occupation/hobbies. Baseline: to be given at subsequent visit Goal status: INITIAL   LONG TERM GOALS: Target date: 01/28/2024  1.  Patient (> 66 years old) will complete five times sit to stand test in < 15 seconds indicating an increased LE strength and improved balance. Baseline: 19.04 sec Goal status: INITIAL  2.   Pt will improve ABC by at least 13% in order to demonstrate clinically significant improvement in balance confidence.  Baseline: TBD Goal status: INITIAL   3.  Pt will improve DGI by at least 3 points in order to demonstrate clinically significant improvement in balance and decreased risk for falls. Baseline: TBD Goal status: INITIAL   4.  Patient will reduce timed up and go to <11 seconds to reduce fall risk and demonstrate improved transfer/gait ability. Baseline: 14.68 sec Goal status: INITIAL  5.  Patient will increase 10 meter walk test to >1.80m/s as to improve gait speed for better community ambulation and to reduce fall risk. Baseline: 15.32 sec; 0.65 m/s Goal status: INITIAL  6.  Patient will increase six minute walk test distance to >1000 for progression to community ambulator and improve gait ability Baseline: 9/10: tolerates on 451ft over 64m26sec before legs before increasingly ataxic  Goal status: INITIAL   ASSESSMENT:  CLINICAL IMPRESSION:  Patient arrived with good motivation for completion of pt activities.  Pt ambulated without AD this date without LOB but was unsteady at times. Mildly orthostatic at start of session but improves with standing 1 min. Pt progresses with endurance and balance activities this date. Pt still requires frequent rest d/t fatigue. Pt will continue to benefit from skilled physical therapy intervention to address impairments, improve QOL, and attain therapy goals.    OBJECTIVE IMPAIRMENTS: Abnormal gait, decreased balance, decreased mobility, difficulty walking, dizziness, and improper body mechanics.   ACTIVITY LIMITATIONS: lifting, sitting, standing, stairs, transfers, reach over head, and locomotion level  PARTICIPATION LIMITATIONS: meal prep, cleaning, laundry, driving, shopping, community activity, and yard work  PERSONAL FACTORS: Age, Time since onset of injury/illness/exacerbation, and 3+ comorbidities: asthma, CAD, DM2, heart murmur, L  TKA, HTN, Depressive disorder,migraines are also affecting patient's functional outcome.   REHAB POTENTIAL: Good  CLINICAL DECISION MAKING: Evolving/moderate complexity  EVALUATION COMPLEXITY: Moderate  PLAN:  PT FREQUENCY: 2x/week  PT DURATION: 12 weeks  PLANNED INTERVENTIONS: 97750- Physical Performance Testing, 97110-Therapeutic exercises, 97530- Therapeutic activity, [id number]- Neuromuscular re-education, 97535- Self Care, [id number]- Manual therapy, [id number]- Gait training, [phone]-  Canalith repositioning, Balance training, and Stair training  PLAN FOR NEXT SESSION:  -vitals as needed -FU on fluid/electrolyte intake Gait with 4WW to increase tolerance and variable training for ataxia management.    Note: Portions of this document were prepared using Dragon voice recognition software and although reviewed may contain unintentional dictation errors in syntax, grammar, or spelling.  Lonni KATHEE Gainer PT ,DPT Physical Therapist- [place]  [facility]   8:49 AM 12/02/23

## 2023-12-04 ENCOUNTER — Ambulatory Visit: Admitting: Physical Therapy

## 2023-12-04 DIAGNOSIS — R293 Abnormal posture: Secondary | ICD-10-CM

## 2023-12-04 DIAGNOSIS — R2689 Other abnormalities of gait and mobility: Secondary | ICD-10-CM | POA: Diagnosis not present

## 2023-12-04 DIAGNOSIS — M6281 Muscle weakness (generalized): Secondary | ICD-10-CM

## 2023-12-04 DIAGNOSIS — R262 Difficulty in walking, not elsewhere classified: Secondary | ICD-10-CM

## 2023-12-04 NOTE — Therapy (Signed)
 OUTPATIENT PHYSICAL THERAPY NEURO    Patient Name: Bianca Rogers MRN: [id number] DOB:Jul 10, 1950, 73 y.o., female Today's Date: 12/04/2023  PCP: Vincente Shivers, NP  REFERRING PROVIDER: Lane Arthea BRAVO, MD   END OF SESSION:  PT End of Session - 12/04/23 0846     Visit Number 7    Number of Visits 25    Date for Recertification  01/28/24    Authorization Type [employer] medic[employer]    Authorization Time Period 11/05/23-02/07/24 24 visits    Authorization - Number of Visits 24    Progress Note Due on Visit 10    PT Start Time 0845    PT Stop Time 0927    PT Time Calculation (min) 42 min    Equipment Utilized During Treatment Gait belt    Activity Tolerance Patient tolerated treatment well;No increased pain    Behavior During Therapy [facility] for tasks assessed/performed            Past Medical History:  Diagnosis Date   Anal fissure 12/09/2016   Added automatically from request for surgery [id number]    Added automatically from request for surgery [id number]     Arthralgia of left knee 12/03/2020   Asthma 08/29/2022   Chronic/stable.    Follows with Dr. Elayne, pulmonology.   Using Flovent  and albuterol  inhaler as needed.     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     Heart murmur    History of total knee arthroplasty 08/21/2021   Hyperlipidemia 08/29/2022   Lab Results   Component Value Date    Cholesterol 197 01/28/2022      Lab Results   Component Value Date    HDL 56 01/28/2022      Lab Results   Component Value Date    LDL Calculated 115 01/28/2022      Lab Results   Component Value Date    Triglycerides 131 01/28/2022      Lab Results    Component Value Date    Chol/HDL Ratio 3.5 01/28/2022      Criteria used to determine recommendation include:    Hypertension    Invasive carcinoma of breast (HCC)    Liver cirrhosis secondary to NASH (HCC)    Lung nodule    Major depressive disorder in full remission 04/02/2020   Mood stable on venlafaxine  150 mg daily.     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 breast of female, estrogen receptor positive (HCC)    Migraine 08/29/2022   NAFLD (nonalcoholic fatty liver disease) 92/76/7985   Non-alcoholic cirrhosis (HCC)    Osteoarthritis of left knee 09/03/2020   Peritoneal hematoma 07/07/2019   Sprain of MCL (medial collateral ligament) of knee 09/03/2020   Symptom associated with female genital organs 07/09/2010   Past Surgical History:  Procedure Laterality Date   ABDOMINAL HYSTERECTOMY     AXILLARY SENTINEL NODE BIOPSY Left 04/22/2023   Procedure: AXILLARY SENTINEL NODE BIOPSY;  Surgeon: Lane Shope, MD;  Location: [facility];  Service: General;  Laterality: Left;   BREAST BIOPSY Left 03/31/2023   Us  Core bx, coil clip - path pending   BREAST BIOPSY  Left 03/31/2023   US  LT BREAST BX W LOC DEV 1ST LESION IMG BX SPEC US  GUIDE 03/31/2023 [facility]   BREAST BIOPSY Left 04/20/2023   US  LT RADIO FREQUENCY TAG LOC US  GUIDE 04/20/2023 [facility]   BREAST LUMPECTOMY WITH RADIOFREQUENCY TAG IDENTIFICATION Left 04/22/2023   Procedure: BREAST LUMPECTOMY WITH RADIOFREQUENCY TAG IDENTIFICATION;  Surgeon: Lane Shope, MD;  Location: [facility];  Service: General;  Laterality: Left;   CARDIAC CATHETERIZATION  03/10/2012   Negative   CATARACT EXTRACTION Bilateral    CESAREAN SECTION     CHOLECYSTECTOMY     RE-EXCISION OF BREAST LUMPECTOMY Left 05/20/2023   Procedure: EXCISION, LESION, BREAST, REPEAT;  Surgeon: Lane Shope, MD;  Location: [facility];  Service: General;  Laterality: Left;   SPLENECTOMY     TONSILLECTOMY     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 breast of female, estrogen receptor positive (HCC) 04/09/2023   Invasive carcinoma of breast (HCC) 04/08/2023   Family history of breast cancer 04/08/2023   Gastroesophageal reflux disease 03/02/2023   Liver cirrhosis secondary to NASH (HCC) 03/02/2023   Bronchiectasis (HCC) 06/09/2013   Type 2 diabetes mellitus with diabetic neuropathy (HCC) 06/09/2013   HTN (hypertension), benign 06/09/2013    ONSET DATE: 10/09/23  REFERRING DIAG: R29.6 (ICD-10-CM) - Frequent falls   THERAPY DIAG:  Difficulty in walking, not elsewhere classified  Muscle weakness (generalized)  Imbalance  Abnormal posture  Rationale for Evaluation and Treatment: Rehabilitation  SUBJECTIVE:                                                                                                                                                                                             SUBJECTIVE STATEMENT:  Pt reports that she is feeling better today. A little wobbly coming in but overall she is feeling good. Was a little sore after last session but is recovered now.   Pt accompanied by: self  PERTINENT HISTORY:   Pt reports she has had PT in [place] before for her neck and after her L TKA.  Pt reports that she has been having neck pain and headaches to the point where she will be having an MRI tomorrow.  Pt reports having 2 falls (August 2nd, August 6th).  Pt notes that the first fall she was trying to get out of the recliner  and she fell forward.  Pt reports on the second fall, she missed a step. Pt reports she feels like she has POTS, and that it runs in her family as well (granddaughte,r and grandson).   Pt  had gabapentin  increased from 1 time in the morning and 1 time at night, to 2 at each time frame, and then recently switched to 3 in the morning and 3 at night. Pt reports she was told by MD to ambulate with a walker, however she has not been doing that.  PMH: asthma, CAD, DM2, heart murmur, L TKA, HTN, Depressive disorder,migraines  PAIN:  Are you having pain? 6/10 Rt hemi headache  PRECAUTIONS: Pt has hx of breast cancer and finished radiation back in May.    WEIGHT BEARING RESTRICTIONS: No  FALLS: Has patient fallen in last 6 months? Yes. Number of falls 2  LIVING ENVIRONMENT: Lives with: mother lives with pt Lives in: Apartment Stairs: Elevator Has following equipment at home: Single point cane and Environmental consultant - 2 wheeled  PLOF: Independent  PATIENT GOALS: Being less wobbly and feel more balances.  OBJECTIVE:  Note: Objective measures were completed at Evaluation unless otherwise noted.  DIAGNOSTIC FINDINGS: Pt to have an MRI tomorrow.    COGNITION: Overall cognitive status: Within functional limits for tasks assessed   SENSATION: WFL, how pt notes burning in the bottom of her feet.  Pt reports she has neuropathy in the feet.  COORDINATION: Not formally tested   ORTHOSTATICS:  Eval Supine - BP: 131/52  mmHG  Sitting - BP: 112/55 mmHg  Standing - BP: 96/53 mmHg  Standing +3 Min - BP: 133/70 mmHg    11/18/23  117/91mmHg 78bpm  supine  102/29mmHg 80bpm seated 0 118/50mmHg 84bpm seated x1 minute 87/5mmHg 84bpm standing x0 minutes 104/65mmHg 88bpm standing x1 minute 105/40mmHg 87bpm standing x2 minute 110/2mmHg 90bpm standing post [id number]    9/12:  Seated 121/63 HR 68bpm  Standing 0 min 120/60 HR79  Standing 1 min 127/69 HR 86  Following gait for 157ft 126/56 HR82     9/17:  Seated: 115/61 HR 76 Standing: 81/49 HR 79 mild lightheadedness Standing 1 min 101/60 HR 87  reduced s/s but still slightly present  9/24: Seated 132/64 HR 67  Standing : 116/61 HR 70    Standing 1 min: 131/63 HR 72  FUNCTIONAL TESTS:  5 times sit to stand: 19.04 sec Timed up and go (TUG): 14.68 sec 6 minute walk test:  9/10: tolerates on 467ft over 62m26sec before legs before increasingly ataxic  9/12: gait training with use of 4WW; tolerated 665ft, for 4:32, no significant ataxia noted, and only mild pain in sacrum area.  10 meter walk test: 15.32 sec; 0.65 m/s Dynamic Gait Index   PATIENT SURVEYS:  ABC Scale:  64% (9/10)  TREATMENT DATE: 12/04/23    TA- To improve functional movements patterns for everyday tasks   Gait with 4WW 300 ft x 2 with 2.5#AW   alternating with below step ups with 2.5#AW - 2 x 10 reps of these   - increases imbalance with second round so BP measurement taken: BP: 135/65  mmHg with HR 79  Lateral stepping with 2.5#AW 3 laps x 25 ft ea direction ea lap  Box stepping - facing same direction x 3 laps ( 8 ft lateral step and 20 fto forward and retro) x 3 laps   3 laps of ant then retro stepping using mirror and color of tiles for feedback on foot positioning for optimal support/ balance   NMR: To facilitate reeducation of movement, balance, posture, coordination, and/or proprioception/kinesthetic sense.     Airex lateral step up and down x 10 ea side   Forward and retro step on / off x 20   SLS 2 x 30 sec ea LE- improved with practice to greater than 15 sec B   Dynamic slow march 2 x 10 ea - instructed could practice this at home near supportive surface for safety.   Pt required occasional rest breaks due fatigue, PT was attentive to when pt appeared to be tired or winded in order to prevent excessive fatigue.     PATIENT EDUCATION: Education details: Pt educated on role of PT and services provided during current POC, along with prognosis and information about the clinic.  Person educated:  Patient Education method: Explanation Education comprehension: verbalized understanding  HOME EXERCISE PROGRAM: TBD at subsequent visit  GOALS: Goals reviewed with patient? Yes  SHORT TERM GOALS: Target date: 12/03/2023   Pt will be independent with HEP in order to demonstrate increased ability to perform tasks related to occupation/hobbies. Baseline: to be given at subsequent visit Goal status: INITIAL   LONG TERM GOALS: Target date: 01/28/2024  1.  Patient (> 79 years old) will complete five times sit to stand test in < 15 seconds indicating an increased LE strength and improved balance. Baseline: 19.04 sec Goal status: INITIAL  2.  Pt will improve ABC by at least 13% in order to demonstrate clinically significant improvement in balance confidence.  Baseline: TBD Goal status: INITIAL   3.  Pt will improve DGI by at least 3 points in order to demonstrate clinically significant improvement in balance and decreased risk for falls. Baseline: TBD Goal status: INITIAL   4.  Patient will reduce timed up and go to <11 seconds to reduce fall risk and demonstrate improved transfer/gait ability. Baseline: 14.68 sec Goal status: INITIAL  5.  Patient will increase 10 meter walk test to >1.71m/s as to improve gait speed for better community ambulation and to reduce fall risk. Baseline: 15.32 sec; 0.65 m/s Goal status: INITIAL  6.  Patient will increase six minute walk test distance to >1000 for progression to community ambulator and improve gait ability Baseline: 9/10: tolerates on 458ft over 48m26sec before legs before increasingly ataxic  Goal status: INITIAL   ASSESSMENT:  CLINICAL IMPRESSION:  Patient arrived with good motivation for completion of pt activities.  Patient continues demonstrate improvement in symptoms and improved balance with many activities this date.  Patient's use with various activities continues to improve and patient responds well and listens to verbal cues  and instructions to improve her balance and form with many activities.Pt will continue to benefit from skilled physical therapy intervention to address impairments, improve QOL, and attain  therapy goals.     OBJECTIVE IMPAIRMENTS: Abnormal gait, decreased balance, decreased mobility, difficulty walking, dizziness, and improper body mechanics.   ACTIVITY LIMITATIONS: lifting, sitting, standing, stairs, transfers, reach over head, and locomotion level  PARTICIPATION LIMITATIONS: meal prep, cleaning, laundry, driving, shopping, community activity, and yard work  PERSONAL FACTORS: Age, Time since onset of injury/illness/exacerbation, and 3+ comorbidities: asthma, CAD, DM2, heart murmur, L TKA, HTN, Depressive disorder,migraines are also affecting patient's functional outcome.   REHAB POTENTIAL: Good  CLINICAL DECISION MAKING: Evolving/moderate complexity  EVALUATION COMPLEXITY: Moderate  PLAN:  PT FREQUENCY: 2x/week  PT DURATION: 12 weeks  PLANNED INTERVENTIONS: 97750- Physical Performance Testing, 97110-Therapeutic exercises, 97530- Therapeutic activity, [id number]- Neuromuscular re-education, 97535- Self Care, [id number]- Manual therapy, [id number]- Gait training, [phone]- Canalith repositioning, Balance training, and Stair training  PLAN FOR NEXT SESSION:  -vitals as needed -FU on fluid/electrolyte intake Gait with 4WW to increase tolerance and variable training for ataxia management.    Note: Portions of this document were prepared using Dragon voice recognition software and although reviewed may contain unintentional dictation errors in syntax, grammar, or spelling.  Lonni KATHEE Gainer PT ,DPT Physical Therapist- [place]  [facility]   9:47 AM 12/04/23

## 2023-12-07 ENCOUNTER — Ambulatory Visit: Admitting: Student in an Organized Health Care Education/Training Program

## 2023-12-07 ENCOUNTER — Encounter: Payer: Self-pay | Admitting: Student in an Organized Health Care Education/Training Program

## 2023-12-07 VITALS — BP 142/74 | HR 73 | Temp 97.8°F | Ht 64.0 in | Wt 147.0 lb

## 2023-12-07 DIAGNOSIS — J4540 Moderate persistent asthma, uncomplicated: Secondary | ICD-10-CM

## 2023-12-07 DIAGNOSIS — J479 Bronchiectasis, uncomplicated: Secondary | ICD-10-CM

## 2023-12-07 MED ORDER — LORATADINE 10 MG PO TABS: 10.0000 mg | ORAL_TABLET | Freq: Every day | ORAL | 6 refills | Status: AC | PRN

## 2023-12-07 MED ORDER — ALBUTEROL SULFATE HFA 108 (90 BASE) MCG/ACT IN AERS: 1.0000 | INHALATION_SPRAY | Freq: Four times a day (QID) | RESPIRATORY_TRACT | 6 refills | Status: AC | PRN

## 2023-12-07 NOTE — Progress Notes (Signed)
 Assessment & Plan:   1. Bronchiectasis 2. Moderate persistent asthma  She presents for follow-up of her asthma as well as previously noted and stable bronchiectasis.  She had a CT scan of the chest in high-resolution prior to our previous visit which showed only mild bronchiectasis in the lung bases with very minimal fibrotic changes.  We decided to continue to monitor these and I plan to repeat imaging in a year.  I had hoped to obtain pulmonary function testing but this was derailed given the patient's recent diagnosis of breast cancer.  She has previously had spirometry in 2022 that suggested well-controlled asthma.  Her ICS/LABA was restarted and she is currently using Wixela.  Today, I will reorder her PFTs to establish a baseline and evaluate for any residual obstruction.  We will continue with Wixela for controller therapy and I will reorder her albuterol  to be used as needed.  I will also order loratadine to help with any postnasal drip that could be contributing to her symptoms.  Finally, patient reports up titration of her gabapentin  therapy which can sometimes be associated with worse outcomes in patients with asthma or COPD.  Recommend that she discuss this with her prescribing provider and attempt to taper this down.  - Pulmonary Function Test; Future - albuterol  (PROVENTIL  HFA) 108 (90 Base) MCG/ACT inhaler; Inhale 1-2 puffs into the lungs every 6 (six) hours as needed for wheezing or shortness of breath.  Dispense: 8 g; Refill: 6 - loratadine (CLARITIN) 10 MG tablet; Take 1 tablet (10 mg total) by mouth daily as needed for allergies.  Dispense: 90 tablet; Refill: 6 - Continue Wixela   Return in about 6 months (around 06/05/2024).  I spent 31 minutes caring for this patient today, including preparing to see the patient, obtaining a medical history , reviewing a separately obtained history, performing a medically appropriate examination and/or evaluation, counseling and  educating the patient/family/caregiver, ordering medications, tests, or procedures, documenting clinical information in the electronic health record, and independently interpreting results (not separately reported/billed) and communicating results to the patient/family/caregiver  Bianca November, MD [facility]   End of visit medications:  Meds ordered this encounter  Medications   albuterol  (PROVENTIL  HFA) 108 (90 Base) MCG/ACT inhaler    Sig: Inhale 1-2 puffs into the lungs every 6 (six) hours as needed for wheezing or shortness of breath.    Dispense:  8 g    Refill:  6   loratadine (CLARITIN) 10 MG tablet    Sig: Take 1 tablet (10 mg total) by mouth daily as needed for allergies.    Dispense:  90 tablet    Refill:  6     Current Outpatient Medications:    aspirin  (ASPIRIN  81) 81 MG chewable tablet, Chew 1 tablet (81 mg total) by mouth daily., Disp: , Rfl:    Calcium Carbonate (CALCIUM 500 PO), Take 500 mg by mouth in the morning and at bedtime., Disp: , Rfl:    ezetimibe  (ZETIA ) 10 MG tablet, Take 1 tablet (10 mg total) by mouth at bedtime., Disp: 90 tablet, Rfl: 1   fluticasone -salmeterol (WIXELA INHUB) 250-50 MCG/ACT AEPB, Inhale 1 puff into the lungs in the morning and at bedtime., Disp: 60 each, Rfl: 12   gabapentin  (NEURONTIN ) 100 MG capsule, Take 1 capsule (100 mg total) by mouth 2 (two) times daily., Disp: 120 capsule, Rfl: 0   lisinopril  (ZESTRIL ) 20 MG tablet, Take 1 tablet (20 mg total) by mouth daily., Disp:  90 tablet, Rfl: 1   metFORMIN  (GLUCOPHAGE -XR) 500 MG 24 hr tablet, Take 2 tablets (1,000 mg total) by mouth 2 (two) times daily with a meal., Disp: 360 tablet, Rfl: 0   omeprazole  (PRILOSEC) 20 MG capsule, TAKE 1 CAPSULE (20 MG TOTAL) BY MOUTH DAILY., Disp: 90 capsule, Rfl: 1   pravastatin  (PRAVACHOL ) 80 MG tablet, Take 1 tablet (80 mg total) by mouth at bedtime., Disp: 90 tablet, Rfl: 1   venlafaxine  XR (EFFEXOR  XR) 150 MG 24 hr capsule, Take 1  capsule (150 mg total) by mouth daily with breakfast., Disp: 90 capsule, Rfl: 1   VITAMIN D, CHOLECALCIFEROL, PO, Take 2,000 Units by mouth daily., Disp: , Rfl:    albuterol  (PROVENTIL  HFA) 108 (90 Base) MCG/ACT inhaler, Inhale 1-2 puffs into the lungs every 6 (six) hours as needed for wheezing or shortness of breath., Disp: 8 g, Rfl: 6   clobetasol  cream (TEMOVATE ) 0.05 %, Apply 1 Application topically 2 (two) times daily. (Patient not taking: Reported on 12/07/2023), Disp: 60 g, Rfl: 0   loratadine (CLARITIN) 10 MG tablet, Take 1 tablet (10 mg total) by mouth daily as needed for allergies., Disp: 90 tablet, Rfl: 6   tamoxifen  (NOLVADEX ) 20 MG tablet, Take 1 tablet (20 mg total) by mouth daily. (Patient not taking: Reported on 12/07/2023), Disp: 90 tablet, Rfl: 3   Subjective:   PATIENT ID: Bianca Rogers GENDER: female DOB: 03/26/1950, MRN: [id number]  Chief Complaint  Patient presents with   Asthma    Cough and occasional wheezing. Is needing refill on albuterol  inhaler.     HPI  Patient is a pleasant 73 year old female presenting for follow-up of asthma and bronchiectasis.  I first met with Bianca Rogers in January of 2025 to discuss her asthma and bronchiectasis. We re-initiated bronchodilators with ICS/LABA (Wixela) which she's tolerated very well.  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 She is recommended adjuvant radiation followed by adjuvant endocrine therapy. She's had a few episodes of breathing difficulty after her surgeries. She did have to use her rescue inhalers more often after the surery but is now back to her baseline. We had prescribed Advair during our prior visit but she was unable to afford it and is asking for a replacement. She is also he to discuss the results from her chest CT.  Return  Visit 12/07/23:  Feels well from a breathing perspective, and is overall improved. She has no shortness of breath, and no wheeze during the day. Does report an occasional cough and wheeze at night. Feels she has a runny nose and a post nasal drip. Taking her Wixela without issue, but has ran out of albuterol . Reports her gabapentin  dose has been uptitrated recently.   She has a past medical history of asthma as well as mild bronchiectasis which has been followed by pulmonary for the past 10 years. She also has a history of NAFLD. She was previously seen by Dr. Lavella at [facility] who noted very mild bronchiectasis/bronchiolectasis in the lung bases. At that time, workup for immune deficiency and alpha-1 deficiency was negative. She was subsequently followed by Dr. Elayne at [facility] in [place]. She'd developed cough productive of sputum over the years while there, and was maintaeind on Flovent  twice daily for management of asthma. 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  Patient does have a very distant history of smoking for around 5 years in the early 17s. Denies any significant occupational history or exposures.   PFT 08/2020 at [facility]: FVC 95%, FEV1 106%, FEV1/FVC 0.86, TLC 88%, DLCO 60%, DLCO/VA 70%    Vaccination history: Received Meningococcal vaccine, pneumococcal vaccination series (PCV-13 2017/2021 & PSV-23 2020 and 2021).  Ancillary information including prior medications, full medical/surgical/family/social histories, and PFTs (when available) are listed below and have been reviewed.   Review of Systems  Constitutional:  Negative for chills, fever, malaise/fatigue and weight loss.  Respiratory:  Positive for cough. Negative for hemoptysis, sputum production, shortness of breath and wheezing.   Cardiovascular:  Negative for chest pain.  Skin:  Negative for rash.     Objective:   Vitals:   12/07/23 0953  BP: (!) 142/74   Pulse: 73  Temp: 97.8 F (36.6 C)  TempSrc: Temporal  SpO2: 98%  Weight: 147 lb (66.7 kg)  Height: 5' 4 (1.626 m)   98% on RA  BMI Readings from Last 3 Encounters:  12/07/23 25.23 kg/m  11/30/23 26.43 kg/m  11/25/23 24.92 kg/m   Wt Readings from Last 3 Encounters:  12/07/23 147 lb (66.7 kg)  11/30/23 154 lb (69.9 kg)  11/25/23 145 lb 3.2 oz (65.9 kg)    Physical Exam Constitutional:      Appearance: Normal appearance.  HENT:     Head: Normocephalic.  Cardiovascular:     Rate and Rhythm: Normal rate and regular rhythm.     Pulses: Normal pulses.     Heart sounds: Normal heart sounds.  Pulmonary:     Effort: Pulmonary effort is normal.     Breath sounds: Normal breath sounds. No wheezing or rales.  Abdominal:     Palpations: Abdomen is soft.  Musculoskeletal:     Right lower leg: No edema.     Left lower leg: No edema.  Neurological:     General: No focal deficit present.     Mental Status: She is alert and oriented to person, place, and time. Mental status is at baseline.       Ancillary Information    Past Medical History:  Diagnosis Date   Anal fissure 12/09/2016   Added automatically from request for surgery [id number]    Added automatically from request for surgery [id number]     Arthralgia of left knee 12/03/2020   Asthma 08/29/2022   Chronic/stable.    Follows with Dr. Elayne, pulmonology.   Using Flovent  and albuterol  inhaler as needed.     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     Heart murmur    History of total knee arthroplasty 08/21/2021   Hyperlipidemia 08/29/2022   Lab Results   Component  Value Date    Cholesterol 197 01/28/2022      Lab Results   Component Value Date    HDL 56 01/28/2022      Lab Results   Component Value Date    LDL Calculated 115 01/28/2022      Lab Results   Component Value Date  Triglycerides 131 01/28/2022      Lab Results   Component Value Date    Chol/HDL Ratio 3.5 01/28/2022      Criteria used to determine recommendation include:    Hypertension    Invasive carcinoma of breast (HCC)    Liver cirrhosis secondary to NASH (HCC)    Lung nodule    Major depressive disorder in full remission 04/02/2020   Mood stable on venlafaxine  150 mg daily.     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 breast of female, estrogen receptor positive (HCC)    Migraine 08/29/2022   NAFLD (nonalcoholic fatty liver disease) 92/76/7985   Non-alcoholic cirrhosis (HCC)    Osteoarthritis of left knee 09/03/2020   Peritoneal hematoma 07/07/2019   Sprain of MCL (medial collateral ligament) of knee 09/03/2020   Symptom associated with female genital organs 07/09/2010     Family History  Problem Relation Age of Onset   Transient ischemic attack Mother    Thyroid disease Mother    Arthritis Mother    Hearing loss Mother    High blood pressure Mother    High Cholesterol Mother    Miscarriages / Stillbirths Mother    Stroke Mother    Cancer Father 35       metastatic, possible lung or prostate primary   Arthritis Father    Asthma Father    COPD Father    Depression Father    Early death Father    Hearing loss Father    Heart disease Father    High Cholesterol Father    Breast cancer Sister 74   Arthritis Sister    Birth defects Sister    Cancer Sister        breast cancer   Diabetes Sister    High blood pressure Sister    Diabetes Paternal Grandmother    Arthritis Paternal Grandfather    Cancer Paternal Grandfather    Breast cancer Daughter 15       TNBC   Cervical cancer Daughter    Breast cancer Niece 36     Past Surgical History:  Procedure  Laterality Date   ABDOMINAL HYSTERECTOMY     AXILLARY SENTINEL NODE BIOPSY Left 04/22/2023   Procedure: AXILLARY SENTINEL NODE BIOPSY;  Surgeon: Lane Shope, MD;  Location: [facility];  Service: General;  Laterality: Left;   BREAST BIOPSY Left 03/31/2023   Us  Core bx, coil clip - path pending   BREAST BIOPSY Left 03/31/2023   US  LT BREAST BX W LOC DEV 1ST LESION IMG BX SPEC US  GUIDE 03/31/2023 [facility]   BREAST BIOPSY Left 04/20/2023   US  LT RADIO FREQUENCY TAG LOC US  GUIDE 04/20/2023 [facility]   BREAST LUMPECTOMY WITH RADIOFREQUENCY TAG IDENTIFICATION Left 04/22/2023   Procedure: BREAST LUMPECTOMY WITH RADIOFREQUENCY TAG IDENTIFICATION;  Surgeon: Lane Shope, MD;  Location: [facility];  Service: General;  Laterality: Left;   CARDIAC CATHETERIZATION  03/10/2012   Negative   CATARACT EXTRACTION Bilateral    CESAREAN SECTION     CHOLECYSTECTOMY     RE-EXCISION OF BREAST LUMPECTOMY Left 05/20/2023   Procedure: EXCISION, LESION, BREAST, REPEAT;  Surgeon: Lane Shope, MD;  Location: [facility];  Service: General;  Laterality: Left;   SPLENECTOMY     TONSILLECTOMY      Social History   Socioeconomic History   Marital status: Legally Separated    Spouse name: Not on file   Number of children: 4   Years of education: Not on file   Highest  education level: GED or equivalent  Occupational History   Occupation: Retired  Tobacco Use   Smoking status: Former    Current packs/day: 0.00    Average packs/day: 2.0 packs/day for 7.2 years (14.5 ttl pk-yrs)    Types: Cigarettes    Start date: 63    Quit date: 06/10/1983    Years since quitting: 40.5   Smokeless tobacco: Never  Vaping Use   Vaping status: Never Used  Substance and Sexual Activity   Alcohol use: No   Drug use: No   Sexual activity: Not on file  Other Topics Concern   Not on file  Social History Narrative   Not on file   Social Drivers of Health   Financial Resource Strain: Patient Declined  (09/04/2023)   Overall Financial Resource Strain (CARDIA)    Difficulty of Paying Living Expenses: Patient declined  Food Insecurity: No Food Insecurity (09/04/2023)   Hunger Vital Sign    Worried About Running Out of Food in the Last Year: Never true    Ran Out of Food in the Last Year: Never true  Transportation Needs: No Transportation Needs (09/04/2023)   PRAPARE - Administrator, Civil Service (Medical): No    Lack of Transportation (Non-Medical): No  Physical Activity: Insufficiently Active (09/04/2023)   Exercise Vital Sign    Days of Exercise per Week: 2 days    Minutes of Exercise per Session: 30 min  Stress: Stress Concern Present (09/04/2023)   [employer] of Occupational Health - Occupational Stress Questionnaire    Feeling of Stress: To some extent  Social Connections: Moderately Isolated (09/04/2023)   Social Connection and Isolation Panel    Frequency of Communication with Friends and Family: More than three times a week    Frequency of Social Gatherings with Friends and Family: Twice a week    Attends Religious Services: More than 4 times per year    Active Member of [employer] or Organizations: No    Attends Engineer, structural: Not on file    Marital Status: Separated  Intimate Partner Violence: Not At Risk (08/20/2023)   Humiliation, Afraid, Rape, and Kick questionnaire    Fear of Current or Ex-Partner: No    Emotionally Abused: No    Physically Abused: No    Sexually Abused: No     Allergies  Allergen Reactions   Penicillins Rash    Other reaction(s): unknown reaction   Zoloft [Sertraline Hcl] Other (See Comments)    Paradoxical response   Codeine Other (See Comments) and Anxiety    Irritable, insomnia   Sertraline Anxiety     CBC    Component Value Date/Time   WBC 9.0 11/11/2023 1151   RBC 3.89 11/11/2023 1151   HGB 11.4 (L) 11/11/2023 1151   HGB 11.8 (L) 10/12/2023 0930   HCT 34.5 (L) 11/11/2023 1151   PLT 331.0 11/11/2023 1151    PLT 366 10/12/2023 0930   MCV 88.7 11/11/2023 1151   MCH 29.6 10/12/2023 0930   MCHC 33.1 11/11/2023 1151   RDW 14.0 11/11/2023 1151   LYMPHSABS 2.7 10/12/2023 0930   MONOABS 1.2 (H) 10/12/2023 0930   EOSABS 0.5 10/12/2023 0930   BASOSABS 0.1 10/12/2023 0930    Pulmonary Functions Testing Results:     No data to display          Outpatient Medications Prior to Visit  Medication Sig Dispense Refill   aspirin  (ASPIRIN  81) 81 MG chewable tablet Chew 1 tablet (  81 mg total) by mouth daily.     Calcium Carbonate (CALCIUM 500 PO) Take 500 mg by mouth in the morning and at bedtime.     ezetimibe  (ZETIA ) 10 MG tablet Take 1 tablet (10 mg total) by mouth at bedtime. 90 tablet 1   fluticasone -salmeterol (WIXELA INHUB) 250-50 MCG/ACT AEPB Inhale 1 puff into the lungs in the morning and at bedtime. 60 each 12   gabapentin  (NEURONTIN ) 100 MG capsule Take 1 capsule (100 mg total) by mouth 2 (two) times daily. 120 capsule 0   lisinopril  (ZESTRIL ) 20 MG tablet Take 1 tablet (20 mg total) by mouth daily. 90 tablet 1   metFORMIN  (GLUCOPHAGE -XR) 500 MG 24 hr tablet Take 2 tablets (1,000 mg total) by mouth 2 (two) times daily with a meal. 360 tablet 0   omeprazole  (PRILOSEC) 20 MG capsule TAKE 1 CAPSULE (20 MG TOTAL) BY MOUTH DAILY. 90 capsule 1   pravastatin  (PRAVACHOL ) 80 MG tablet Take 1 tablet (80 mg total) by mouth at bedtime. 90 tablet 1   venlafaxine  XR (EFFEXOR  XR) 150 MG 24 hr capsule Take 1 capsule (150 mg total) by mouth daily with breakfast. 90 capsule 1   VITAMIN D, CHOLECALCIFEROL, PO Take 2,000 Units by mouth daily.     loratadine (CLARITIN) 10 MG tablet Take 10 mg by mouth daily as needed for allergies.     clobetasol  cream (TEMOVATE ) 0.05 % Apply 1 Application topically 2 (two) times daily. (Patient not taking: Reported on 12/07/2023) 60 g 0   tamoxifen  (NOLVADEX ) 20 MG tablet Take 1 tablet (20 mg total) by mouth daily. (Patient not taking: Reported on 12/07/2023) 90 tablet 3    albuterol  (PROVENTIL  HFA) 108 (90 BASE) MCG/ACT inhaler Inhale 1 puff into the lungs every 6 (six) hours as needed for wheezing or shortness of breath. (Patient not taking: Reported on 12/07/2023)     No facility-administered medications prior to visit.

## 2023-12-08 ENCOUNTER — Ambulatory Visit: Admitting: Physical Therapy

## 2023-12-08 DIAGNOSIS — R262 Difficulty in walking, not elsewhere classified: Secondary | ICD-10-CM

## 2023-12-08 DIAGNOSIS — R2689 Other abnormalities of gait and mobility: Secondary | ICD-10-CM | POA: Diagnosis not present

## 2023-12-08 DIAGNOSIS — M6281 Muscle weakness (generalized): Secondary | ICD-10-CM

## 2023-12-08 DIAGNOSIS — R293 Abnormal posture: Secondary | ICD-10-CM

## 2023-12-08 NOTE — Therapy (Signed)
 OUTPATIENT PHYSICAL THERAPY NEURO    Patient Name: Bianca Rogers MRN: [id number] DOB:Sep 26, 1950, 73 y.o., female Today's Date: 12/08/2023  PCP: Vincente Shivers, NP  REFERRING PROVIDER: Lane Arthea BRAVO, MD   END OF SESSION:  PT End of Session - 12/08/23 0853     Visit Number 8    Number of Visits 25    Date for Recertification  01/28/24    Authorization Type [employer] medic[employer]    Authorization Time Period 11/05/23-02/07/24 24 visits    Authorization - Number of Visits 24    Progress Note Due on Visit 10    PT Start Time 0850    PT Stop Time 0930    PT Time Calculation (min) 40 min    Equipment Utilized During Treatment Gait belt    Activity Tolerance Patient tolerated treatment well;No increased pain    Behavior During Therapy [facility] for tasks assessed/performed            Past Medical History:  Diagnosis Date   Anal fissure 12/09/2016   Added automatically from request for surgery [id number]    Added automatically from request for surgery [id number]     Arthralgia of left knee 12/03/2020   Asthma 08/29/2022   Chronic/stable.    Follows with Dr. Elayne, pulmonology.   Using Flovent  and albuterol  inhaler as needed.     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     Heart murmur    History of total knee arthroplasty 08/21/2021   Hyperlipidemia 08/29/2022   Lab Results   Component Value Date    Cholesterol 197 01/28/2022      Lab Results   Component Value Date    HDL 56 01/28/2022      Lab Results   Component Value Date    LDL Calculated 115 01/28/2022      Lab Results   Component Value Date    Triglycerides 131 01/28/2022      Lab Results    Component Value Date    Chol/HDL Ratio 3.5 01/28/2022      Criteria used to determine recommendation include:    Hypertension    Invasive carcinoma of breast (HCC)    Liver cirrhosis secondary to NASH (HCC)    Lung nodule    Major depressive disorder in full remission 04/02/2020   Mood stable on venlafaxine  150 mg daily.     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 breast of female, estrogen receptor positive (HCC)    Migraine 08/29/2022   NAFLD (nonalcoholic fatty liver disease) 92/76/7985   Non-alcoholic cirrhosis (HCC)    Osteoarthritis of left knee 09/03/2020   Peritoneal hematoma 07/07/2019   Sprain of MCL (medial collateral ligament) of knee 09/03/2020   Symptom associated with female genital organs 07/09/2010   Past Surgical History:  Procedure Laterality Date   ABDOMINAL HYSTERECTOMY     AXILLARY SENTINEL NODE BIOPSY Left 04/22/2023   Procedure: AXILLARY SENTINEL NODE BIOPSY;  Surgeon: Lane Shope, MD;  Location: [facility] ORS;  Service: General;  Laterality: Left;   BREAST BIOPSY Left 03/31/2023   Us  Core bx, coil clip - path pending   BREAST BIOPSY  Left 03/31/2023   US  LT BREAST BX W LOC DEV 1ST LESION IMG BX SPEC US  GUIDE 03/31/2023 [facility]   BREAST BIOPSY Left 04/20/2023   US  LT RADIO FREQUENCY TAG LOC US  GUIDE 04/20/2023 [facility]   BREAST LUMPECTOMY WITH RADIOFREQUENCY TAG IDENTIFICATION Left 04/22/2023   Procedure: BREAST LUMPECTOMY WITH RADIOFREQUENCY TAG IDENTIFICATION;  Surgeon: Lane Shope, MD;  Location: [facility] OR[facility];  Service: General;  Laterality: Left;   CARDIAC CATHETERIZATION  03/10/2012   Negative   CATARACT EXTRACTION Bilateral    CESAREAN SECTION     CHOLECYSTECTOMY     RE-EXCISION OF BREAST LUMPECTOMY Left 05/20/2023   Procedure: EXCISION, LESION, BREAST, REPEAT;  Surgeon: Lane Shope, MD;  Location: [facility];  Service: General;  Laterality: Left;   SPLENECTOMY     TONSILLECTOMY     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  Rationale for Evaluation and Treatment: Rehabilitation  SUBJECTIVE:                                                                                                                                                                                             SUBJECTIVE STATEMENT:  Pt reports that she is doing well. No pain this AM.  Had an Okay weekend; but it was rainy. Has brought SPC in for trial use, but feels like it si scrubbing when she walks.    Pt accompanied by: self  PERTINENT HISTORY:   Pt reports she has had PT in [place] before for her neck and after her L TKA.  Pt reports that she has been having neck pain and headaches to the point where she will be having an MRI tomorrow.  Pt reports having 2 falls (August 2nd, August 6th).  Pt notes that the first fall she was trying to get out of the recliner and she fell forward.  Pt reports on the second fall, she missed a step. Pt reports she feels like she has POTS, and that it runs in her family as well (granddaughte,r and grandson).   Pt had gabapentin  increased from 1 time in the morning and 1 time at night, to 2 at  each time frame, and then recently switched to 3 in the morning and 3 at night. Pt reports she was told by MD to ambulate with a walker, however she has not been doing that.  PMH: asthma, CAD, DM2, heart murmur, L TKA, HTN, Depressive disorder,migraines  PAIN:  Are you having pain? 6/10 Rt hemi headache  PRECAUTIONS: Pt has hx of breast cancer and finished radiation back in May.    WEIGHT BEARING RESTRICTIONS: No  FALLS: Has patient fallen in last 6 months? Yes. Number of falls 2  LIVING ENVIRONMENT: Lives with: mother lives with pt Lives in: Apartment Stairs: Elevator Has following equipment at home: Single point cane and Environmental consultant - 2 wheeled  PLOF: Independent  PATIENT GOALS: Being less wobbly and feel more balances.  OBJECTIVE:  Note: Objective measures were completed at Evaluation unless otherwise noted.  DIAGNOSTIC FINDINGS: Pt to have an MRI tomorrow.    COGNITION: Overall cognitive status: Within functional limits for tasks assessed   SENSATION: WFL, how pt notes burning in the bottom of her feet.  Pt reports she has neuropathy in the feet.  COORDINATION: Not formally tested   ORTHOSTATICS:  Eval Supine - BP: 131/52  mmHG  Sitting - BP: 112/55 mmHg  Standing - BP: 96/53 mmHg  Standing +3 Min - BP: 133/70 mmHg    11/18/23  117/56mmHg 78bpm  supine  102/86mmHg 80bpm seated 0 118/33mmHg 84bpm seated x1 minute 87/41mmHg 84bpm standing x0 minutes 104/76mmHg 88bpm standing x1 minute 105/19mmHg 87bpm standing x2 minute 110/61mmHg 90bpm standing post [id number]    9/12:  Seated 121/63 HR 68bpm  Standing 0 min 120/60 HR79  Standing 1 min 127/69 HR 86  Following gait for 154ft 126/56 HR82     9/17:  Seated: 115/61 HR 76 Standing: 81/49 HR 79 mild lightheadedness Standing 1 min 101/60 HR 87  reduced s/s but still slightly present  9/24: Seated 132/64 HR 67  Standing : 116/61 HR 70   Standing 1 min: 131/63 HR 72  9/30:  -Seated: 119/54 HR 66 -Standing 0  min 110/58 HR 70 mild light headedness as well as tingling in leg -Standing 2 min: 121/65 HR 71   -Sitting:  124/52 HR 65    FUNCTIONAL TESTS:  5 times sit to stand: 19.04 sec Timed up and go (TUG): 14.68 sec 6 minute walk test:  9/10: tolerates on 489ft over 72m26sec before legs before increasingly ataxic  9/12: gait training with use of 4WW; tolerated 630ft, for 4:32, no significant ataxia noted, and only mild pain in sacrum area.  10 meter walk test: 15.32 sec; 0.65 m/s Dynamic Gait Index   PATIENT SURVEYS:  ABC Scale:  64% (9/10)  TREATMENT DATE: 12/08/23  Gait training to improve:  Gait with SPC 133ft x 4 + 368ft x 2  in hall of rehab unit.  Improved sequencing of 3 point pattern with increased repetitions. Throughout gait training PT provided tactile cues for timing of SPC advancement, as pt prefers to let cane trail behind body on each step. Lowered hight of SPC to prevent placing cane foot far in front of BLE.   Pt is noted to have dizziness with 180 deg turns and mild LOB with head turns and visual scanning in stimulating environment.   PT instructed pt standing static balance with vestibular stimulation. Feet together horz VOR 2 x 10 bil  Feet together vert VOR 2 x 10 bil  Cues for proper ROM to speed to only slightly increase s/s.  Performed VOR 1 and 2 in sitting     PATIENT EDUCATION: Education details: Pt educated on role of PT and services provided during current POC, along with prognosis and information about the clinic.  Person educated: Patient Education method: Explanation Education comprehension: verbalized understanding  HOME EXERCISE PROGRAM: Access Code: [email or web address] URL: [email or web address] Date: 12/08/2023 Prepared by: Massie Dollar  Exercises - Backward Walking with Counter Support  - 1 x daily - 5 x weekly - 3  sets - 10 reps - Seated Gaze Stabilization with Head Rotation  - 1 x daily - 5 x weekly - 3 sets - 10 reps - Seated Gaze Stabilization with Head Nod  - 1 x daily - 5 x weekly - 3 sets - 10 reps - Sit to Stand with Arms Crossed  - 1 x daily - 5 x weekly - 3 sets - 10 reps - Side Stepping with Counter Support  - 1 x daily - 5 x weekly - 3 sets - 10 reps  GOALS: Goals reviewed with patient? Yes  SHORT TERM GOALS: Target date: 12/03/2023   Pt will be independent with HEP in order to demonstrate increased ability to perform tasks related to occupation/hobbies. Baseline: to be given at subsequent visit Goal status: INITIAL   LONG TERM GOALS: Target date: 01/28/2024  1.  Patient (> 81 years old) will complete five times sit to stand test in < 15 seconds indicating an increased LE strength and improved balance. Baseline: 19.04 sec Goal status: INITIAL  2.  Pt will improve ABC by at least 13% in order to demonstrate clinically significant improvement in balance confidence.  Baseline: TBD Goal status: INITIAL   3.  Pt will improve DGI by at least 3 points in order to demonstrate clinically significant improvement in balance and decreased risk for falls. Baseline: TBD Goal status: INITIAL   4.  Patient will reduce timed up and go to <11 seconds to reduce fall risk and demonstrate improved transfer/gait ability. Baseline: 14.68 sec Goal status: INITIAL  5.  Patient will increase 10 meter walk test to >1.73m/s as to improve gait speed for better community ambulation and to reduce fall risk. Baseline: 15.32 sec; 0.65 m/s Goal status: INITIAL  6.  Patient will increase six minute walk test distance to >1000 for progression to community ambulator and improve gait ability Baseline: 9/10: tolerates on 486ft over 43m26sec before legs before increasingly ataxic  Goal status: INITIAL   ASSESSMENT:  CLINICAL IMPRESSION:  Patient arrived with good motivation for completion of pt activities.  Pt  demonstrates mild vestibular s/s with mobility on this day. HEP provided to address these deficits. prolonged Gait training with SPC to improve safety and  coordination of AD to reduce fall risk and increase safety with community mobility.  Pt will continue to benefit from skilled physical therapy intervention to address impairments, improve QOL, and attain therapy goals.     OBJECTIVE IMPAIRMENTS: Abnormal gait, decreased balance, decreased mobility, difficulty walking, dizziness, and improper body mechanics.   ACTIVITY LIMITATIONS: lifting, sitting, standing, stairs, transfers, reach over head, and locomotion level  PARTICIPATION LIMITATIONS: meal prep, cleaning, laundry, driving, shopping, community activity, and yard work  PERSONAL FACTORS: Age, Time since onset of injury/illness/exacerbation, and 3+ comorbidities: asthma, CAD, DM2, heart murmur, L TKA, HTN, Depressive disorder,migraines are also affecting patient's functional outcome.   REHAB POTENTIAL: Good  CLINICAL DECISION MAKING: Evolving/moderate complexity  EVALUATION COMPLEXITY: Moderate  PLAN:  PT FREQUENCY: 2x/week  PT DURATION: 12 weeks  PLANNED INTERVENTIONS: 97750- Physical Performance Testing, 97110-Therapeutic exercises, 97530- Therapeutic activity, [id number]- Neuromuscular re-education, 97535- Self Care, [id number]- Manual therapy, [id number]- Gait training, [phone]- Canalith repositioning, Balance training, and Stair training  PLAN FOR NEXT SESSION:  -vitals as needed Continue to education use of SPC . Vestibular and balance habituation interventions.    Massie Dollar PT, DPT  Physical Therapist - [facility]  1:17 PM 12/08/23

## 2023-12-09 ENCOUNTER — Ambulatory Visit: Admitting: General Practice

## 2023-12-11 ENCOUNTER — Ambulatory Visit: Attending: Neurology | Admitting: Physical Therapy

## 2023-12-11 DIAGNOSIS — M6281 Muscle weakness (generalized): Secondary | ICD-10-CM | POA: Insufficient documentation

## 2023-12-11 DIAGNOSIS — R293 Abnormal posture: Secondary | ICD-10-CM | POA: Insufficient documentation

## 2023-12-11 DIAGNOSIS — R2689 Other abnormalities of gait and mobility: Secondary | ICD-10-CM | POA: Insufficient documentation

## 2023-12-11 DIAGNOSIS — R262 Difficulty in walking, not elsewhere classified: Secondary | ICD-10-CM | POA: Diagnosis present

## 2023-12-11 NOTE — Therapy (Signed)
 OUTPATIENT PHYSICAL THERAPY NEURO    Patient Name: Bianca Rogers MRN: [id number] DOB:[birth year], 73 y.o., female Today's Date: 12/11/2023  PCP: Vincente Shivers, NP  REFERRING PROVIDER: Lane Arthea BRAVO, MD   END OF SESSION:  PT End of Session - 12/11/23 0853     Visit Number 9    Number of Visits 25    Date for Recertification  01/28/24    Authorization Type [employer] medic[employer]    Authorization Time Period 11/05/23-02/07/24 24 visits    Authorization - Number of Visits 24    Progress Note Due on Visit 10    PT Start Time 0852    PT Stop Time 0930    PT Time Calculation (min) 38 min    Equipment Utilized During Treatment Gait belt    Activity Tolerance Patient tolerated treatment well;No increased pain    Behavior During Therapy [facility] for tasks assessed/performed            Past Medical History:  Diagnosis Date   Anal fissure 12/09/2016   Added automatically from request for surgery [id number]    Added automatically from request for surgery [id number]     Arthralgia of left knee 12/03/2020   Asthma 08/29/2022   Chronic/stable.    Follows with Dr. Elayne, pulmonology.   Using Flovent  and albuterol  inhaler as needed.     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     Heart murmur    History of total knee arthroplasty 08/21/2021   Hyperlipidemia 08/29/2022   Lab Results   Component Value Date    Cholesterol 197 01/28/2022      Lab Results   Component Value Date    HDL 56 01/28/2022      Lab Results   Component Value Date    LDL Calculated 115 01/28/2022      Lab Results   Component Value Date    Triglycerides 131 01/28/2022      Lab Results    Component Value Date    Chol/HDL Ratio 3.5 01/28/2022      Criteria used to determine recommendation include:    Hypertension    Invasive carcinoma of breast (HCC)    Liver cirrhosis secondary to NASH (HCC)    Lung nodule    Major depressive disorder in full remission 04/02/2020   Mood stable on venlafaxine  150 mg daily.     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 breast of female, estrogen receptor positive (HCC)    Migraine 08/29/2022   NAFLD (nonalcoholic fatty liver disease) 92/76/7985   Non-alcoholic cirrhosis (HCC)    Osteoarthritis of left knee 09/03/2020   Peritoneal hematoma 07/07/2019   Sprain of MCL (medial collateral ligament) of knee 09/03/2020   Symptom associated with female genital organs 07/09/2010   Past Surgical History:  Procedure Laterality Date   ABDOMINAL HYSTERECTOMY     AXILLARY SENTINEL NODE BIOPSY Left 04/22/2023   Procedure: AXILLARY SENTINEL NODE BIOPSY;  Surgeon: Lane Shope, MD;  Location: [facility];  Service: General;  Laterality: Left;   BREAST BIOPSY Left 03/31/2023   Us  Core bx, coil clip - path pending   BREAST BIOPSY  Left 03/31/2023   US  LT BREAST BX W LOC DEV 1ST LESION IMG BX SPEC US  GUIDE 03/31/2023 [facility]   BREAST BIOPSY Left 04/20/2023   US  LT RADIO FREQUENCY TAG LOC US  GUIDE 04/20/2023 [facility]   BREAST LUMPECTOMY WITH RADIOFREQUENCY TAG IDENTIFICATION Left 04/22/2023   Procedure: BREAST LUMPECTOMY WITH RADIOFREQUENCY TAG IDENTIFICATION;  Surgeon: Lane Shope, MD;  Location: [facility];  Service: General;  Laterality: Left;   CARDIAC CATHETERIZATION  03/10/2012   Negative   CATARACT EXTRACTION Bilateral    CESAREAN SECTION     CHOLECYSTECTOMY     RE-EXCISION OF BREAST LUMPECTOMY Left 05/20/2023   Procedure: EXCISION, LESION, BREAST, REPEAT;  Surgeon: Lane Shope, MD;  Location: [facility];  Service: General;  Laterality: Left;   SPLENECTOMY     TONSILLECTOMY     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  Rationale for Evaluation and Treatment: Rehabilitation  SUBJECTIVE:                                                                                                                                                                                             SUBJECTIVE STATEMENT:  Pt reports that she is doing well. No pain this AM.  Utilizing QC on this day, but reports that she does not like it.   Pt accompanied by: self  PERTINENT HISTORY:   Pt reports she has had PT in [place] before for her neck and after her L TKA.  Pt reports that she has been having neck pain and headaches to the point where she will be having an MRI tomorrow.  Pt reports having 2 falls (August 2nd, August 6th).  Pt notes that the first fall she was trying to get out of the recliner and she fell forward.  Pt reports on the second fall, she missed a step. Pt reports  she feels like she has POTS, and that it runs in her family as well (granddaughte,r and grandson).   Pt had gabapentin  increased from 1 time in the morning and 1 time at night, to 2 at each time frame, and then recently switched to 3 in the  morning and 3 at night. Pt reports she was told by MD to ambulate with a walker, however she has not been doing that.  PMH: asthma, CAD, DM2, heart murmur, L TKA, HTN, Depressive disorder,migraines  PAIN:  Are you having pain? 6/10 Rt hemi headache  PRECAUTIONS: Pt has hx of breast cancer and finished radiation back in May.    WEIGHT BEARING RESTRICTIONS: No  FALLS: Has patient fallen in last 6 months? Yes. Number of falls 2  LIVING ENVIRONMENT: Lives with: mother lives with pt Lives in: Apartment Stairs: Elevator Has following equipment at home: Single point cane and Environmental consultant - 2 wheeled  PLOF: Independent  PATIENT GOALS: Being less wobbly and feel more balances.  OBJECTIVE:  Note: Objective measures were completed at Evaluation unless otherwise noted.  DIAGNOSTIC FINDINGS: Pt to have an MRI tomorrow.    COGNITION: Overall cognitive status: Within functional limits for tasks assessed   SENSATION: WFL, how pt notes burning in the bottom of her feet.  Pt reports she has neuropathy in the feet.  COORDINATION: Not formally tested   ORTHOSTATICS:  Eval Supine - BP: 131/52  mmHG  Sitting - BP: 112/55 mmHg  Standing - BP: 96/53 mmHg  Standing +3 Min - BP: 133/70 mmHg    11/18/23  117/72mmHg 78bpm  supine  102/62mmHg 80bpm seated 0 118/56mmHg 84bpm seated x1 minute 87/61mmHg 84bpm standing x0 minutes 104/56mmHg 88bpm standing x1 minute 105/34mmHg 87bpm standing x2 minute 110/38mmHg 90bpm standing post 41ftAMB    9/12:  Seated 121/63 HR 68bpm  Standing 0 min 120/60 HR79  Standing 1 min 127/69 HR 86  Following gait for 160ft 126/56 HR82     9/17:  Seated: 115/61 HR 76 Standing: 81/49 HR 79 mild lightheadedness Standing 1 min 101/60 HR 87  reduced s/s but still slightly present  9/24: Seated 132/64 HR 67  Standing : 116/61 HR 70   Standing 1 min: 131/63 HR 72  9/30:  -Seated: 119/54 HR 66 -Standing 0 min 110/58 HR 70 mild light headedness as well as tingling  in leg -Standing 2 min: 121/65 HR 71   -Sitting:  124/52 HR 65    FUNCTIONAL TESTS:  5 times sit to stand: 19.04 sec Timed up and go (TUG): 14.68 sec 6 minute walk test:  9/10: tolerates on 478ft over 21m26sec before legs before increasingly ataxic  9/12: gait training with use of 4WW; tolerated 645ft, for 4:32, no significant ataxia noted, and only mild pain in sacrum area.  10 meter walk test: 15.32 sec; 0.65 m/s Dynamic Gait Index   PATIENT SURVEYS:  ABC Scale:  64% (9/10)  TREATMENT DATE: 12/11/23   Standing VOR 1 and 2 x 45 sec each in standing with no   Gait training to improve mechanics with SPC :  Gait with SPC x 443ft in hall of rehab unit.  Improved sequencing of 3 point pattern with increased distance. No LOB in turns .   Forward/reverse gait with no UE support 9ft x 6 each  Side stepping R and L 77ft bil x 6   Standing with 6 1 LE on 6 inch step 3 x 15 sec bil without UE support.  Single limb step up hip reciprocal march to tap on 2second step x 8 bil   Throughout PT session, PT provided CGA for safety intermittently with gait and dynamic balance/gait to improve safety and reduced fall risk.   PATIENT EDUCATION: Education details: Pt educated on role of PT and services provided during current POC, along with prognosis and information about the clinic.  Person educated: Patient Education method: Explanation Education comprehension: verbalized understanding  HOME EXERCISE PROGRAM: Access Code: [email or web address] URL: [email or web address] Date: 12/08/2023 Prepared by: Massie Dollar  Exercises - Backward Walking with Counter Support  - 1 x daily - 5 x weekly - 3 sets - 10 reps - Seated Gaze Stabilization with Head Rotation  - 1 x daily - 5 x weekly - 3 sets - 10 reps - Seated Gaze Stabilization with Head Nod  - 1 x daily - 5 x  weekly - 3 sets - 10 reps - Sit to Stand with Arms Crossed  - 1 x daily - 5 x weekly - 3 sets - 10 reps - Side Stepping with Counter Support  - 1 x daily - 5 x weekly - 3 sets - 10 reps  GOALS: Goals reviewed with patient? Yes  SHORT TERM GOALS: Target date: 12/03/2023   Pt will be independent with HEP in order to demonstrate increased ability to perform tasks related to occupation/hobbies. Baseline: to be given at subsequent visit Goal status: INITIAL   LONG TERM GOALS: Target date: 01/28/2024  1.  Patient (> 28 years old) will complete five times sit to stand test in < 15 seconds indicating an increased LE strength and improved balance. Baseline: 19.04 sec Goal status: INITIAL  2.  Pt will improve ABC by at least 13% in order to demonstrate clinically significant improvement in balance confidence.  Baseline: TBD Goal status: INITIAL   3.  Pt will improve DGI by at least 3 points in order to demonstrate clinically significant improvement in balance and decreased risk for falls. Baseline: TBD Goal status: INITIAL   4.  Patient will reduce timed up and go to <11 seconds to reduce fall risk and demonstrate improved transfer/gait ability. Baseline: 14.68 sec Goal status: INITIAL  5.  Patient will increase 10 meter walk test to >1.13m/s as to improve gait speed for better community ambulation and to reduce fall risk. Baseline: 15.32 sec; 0.65 m/s Goal status: INITIAL  6.  Patient will increase six minute walk test distance to >1000 for progression to community ambulator and improve gait ability Baseline: 9/10: tolerates on 445ft over 104m26sec before legs before increasingly ataxic  Goal status: INITIAL   ASSESSMENT:  CLINICAL IMPRESSION:  Patient arrived with good motivation for completion of pt activities.  Continued gait training for proper and safe use of SPC for increased distance. Pt was able to perform proper 3 point pattern with improved positioning compared to prior  session. Continued VOR training for habituation, but is noted to  have increased diplopia on first bout, but improved on second. No overt LOB with dynamic gait and balance training.  Pt will continue to benefit from skilled physical therapy intervention to address impairments, improve QOL, and attain therapy goals.     OBJECTIVE IMPAIRMENTS: Abnormal gait, decreased balance, decreased mobility, difficulty walking, dizziness, and improper body mechanics.   ACTIVITY LIMITATIONS: lifting, sitting, standing, stairs, transfers, reach over head, and locomotion level  PARTICIPATION LIMITATIONS: meal prep, cleaning, laundry, driving, shopping, community activity, and yard work  PERSONAL FACTORS: Age, Time since onset of injury/illness/exacerbation, and 3+ comorbidities: asthma, CAD, DM2, heart murmur, L TKA, HTN, Depressive disorder,migraines are also affecting patient's functional outcome.   REHAB POTENTIAL: Good  CLINICAL DECISION MAKING: Evolving/moderate complexity  EVALUATION COMPLEXITY: Moderate  PLAN:  PT FREQUENCY: 2x/week  PT DURATION: 12 weeks  PLANNED INTERVENTIONS: 97750- Physical Performance Testing, 97110-Therapeutic exercises, 97530- Therapeutic activity, [id number]- Neuromuscular re-education, 97535- Self Care, [id number]- Manual therapy, [id number]- Gait training, [phone]- Canalith repositioning, Balance training, and Stair training  PLAN FOR NEXT SESSION:  -vitals as needed Progress note.  Vestibular and balance habituation interventions.    Massie Dollar PT, DPT  Physical Therapist - [place]  [facility]  8:55 AM 12/11/23

## 2023-12-16 ENCOUNTER — Ambulatory Visit: Admitting: Physical Therapy

## 2023-12-16 DIAGNOSIS — M6281 Muscle weakness (generalized): Secondary | ICD-10-CM

## 2023-12-16 DIAGNOSIS — R2689 Other abnormalities of gait and mobility: Secondary | ICD-10-CM

## 2023-12-16 DIAGNOSIS — R262 Difficulty in walking, not elsewhere classified: Secondary | ICD-10-CM | POA: Diagnosis not present

## 2023-12-16 DIAGNOSIS — R293 Abnormal posture: Secondary | ICD-10-CM

## 2023-12-16 NOTE — Therapy (Signed)
 OUTPATIENT PHYSICAL THERAPY NEURO/  PHYSICAL THERAPY PROGRESS NOTE   Dates of reporting period  11/05/23   to   12/16/23    Patient Name: Bianca Rogers MRN: [id number] DOB:Nov 19, 1950, 73 y.o., female Today's Date: 12/16/2023  PCP: Vincente Shivers, NP  REFERRING PROVIDER: Lane Arthea BRAVO, MD   END OF SESSION:  PT End of Session - 12/16/23 0852     Visit Number 10    Number of Visits 25    Date for Recertification  01/28/24    Authorization Type human[employer] medicare    Authorization Time Period 11/05/23-02/07/24 24 visits    Authorization - Number of Visits 24    Progress Note Due on Visit 10    PT Start Time 0852    PT Stop Time 0932    PT Time Calculation (min) 40 min    Equipment Utilized During Treatment Gait belt    Activity Tolerance Patient tolerated treatment well;No increased pain    Behavior During Therapy [facility] for tasks assessed/performed            Past Medical History:  Diagnosis Date   Anal fissure 12/09/2016   Added automatically from request for surgery [id number]    Added automatically from request for surgery [id number]     Arthralgia of left knee 12/03/2020   Asthma 08/29/2022   Chronic/stable.    Follows with Dr. Elayne, pulmonology.   Using Flovent  and albuterol  inhaler as needed.     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     Heart murmur    History of total knee arthroplasty 08/21/2021   Hyperlipidemia 08/29/2022   Lab Results   Component Value Date    Cholesterol 197 01/28/2022      Lab Results   Component Value Date    HDL 56 01/28/2022      Lab Results   Component Value Date    LDL Calculated 115  01/28/2022      Lab Results   Component Value Date    Triglycerides 131 01/28/2022      Lab Results   Component Value Date    Chol/HDL Ratio 3.5 01/28/2022      Criteria used to determine recommendation include:    Hypertension    Invasive carcinoma of breast (HCC)    Liver cirrhosis secondary to NASH (HCC)    Lung nodule    Major depressive disorder in full remission 04/02/2020   Mood stable on venlafaxine  150 mg daily.     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 breast of female, estrogen receptor positive (HCC)    Migraine 08/29/2022   NAFLD (nonalcoholic fatty liver disease) 92/76/7985   Non-alcoholic cirrhosis (HCC)    Osteoarthritis of left knee 09/03/2020   Peritoneal hematoma 07/07/2019   Sprain of MCL (medial collateral ligament) of knee 09/03/2020   Symptom associated with female genital organs 07/09/2010   Past Surgical History:  Procedure Laterality Date   ABDOMINAL HYSTERECTOMY     AXILLARY SENTINEL NODE BIOPSY Left 04/22/2023   Procedure: AXILLARY SENTINEL NODE BIOPSY;  Surgeon: Lane Shope, MD;  Location: [facility] ORS;  Service: General;  Laterality: Left;  BREAST BIOPSY Left 03/31/2023   Us  Core bx, coil clip - path pending   BREAST BIOPSY Left 03/31/2023   US  LT BREAST BX W LOC DEV 1ST LESION IMG BX SPEC US  GUIDE 03/31/2023 [facility]   BREAST BIOPSY Left 04/20/2023   US  LT RADIO FREQUENCY TAG LOC US  GUIDE 04/20/2023 [facility]   BREAST LUMPECTOMY WITH RADIOFREQUENCY TAG IDENTIFICATION Left 04/22/2023   Procedure: BREAST LUMPECTOMY WITH RADIOFREQUENCY TAG IDENTIFICATION;  Surgeon: Lane Shope, MD;  Location: [facility] OR[facility];  Service: General;  Laterality: Left;   CARDIAC CATHETERIZATION  03/10/2012   Negative   CATARACT EXTRACTION Bilateral    CESAREAN SECTION     CHOLECYSTECTOMY     RE-EXCISION OF BREAST LUMPECTOMY Left 05/20/2023   Procedure: EXCISION, LESION, BREAST, REPEAT;  Surgeon: Lane Shope, MD;  Location: [facility];  Service:  General;  Laterality: Left;   SPLENECTOMY     TONSILLECTOMY     Patient Active Problem List   Diagnosis Date Noted   Dizziness 11/29/2023   Cerebrovascular small vessel disease 11/25/2023   Chronic nonintractable headache 09/08/2023   Osteopenia 05/27/2023   Genetic testing 05/15/2023   Change in facial mole 04/17/2023   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 breast of female, estrogen receptor positive (HCC) 04/09/2023   Invasive carcinoma of breast (HCC) 04/08/2023   Family history of breast cancer 04/08/2023   Gastroesophageal reflux disease 03/02/2023   Liver cirrhosis secondary to NASH (HCC) 03/02/2023   Bronchiectasis (HCC) 06/09/2013   Type 2 diabetes mellitus with diabetic neuropathy (HCC) 06/09/2013   HTN (hypertension), benign 06/09/2013    ONSET DATE: 10/09/23  REFERRING DIAG: R29.6 (ICD-10-CM) - Frequent falls   THERAPY DIAG:  Difficulty in walking, not elsewhere classified  Abnormal posture  Muscle weakness (generalized)  Imbalance  Rationale for Evaluation and Treatment: Rehabilitation  SUBJECTIVE:                                                                                                                                                                                             SUBJECTIVE STATEMENT:  Pt reports that she is doing well. No pain this AM.  Pt did not break SPC on this day.   Pt accompanied by: self  PERTINENT HISTORY:   Pt reports she has had PT in [place] before for her neck and after her L TKA.  Pt reports that she has been having neck pain and headaches to the point where she will be having an MRI tomorrow.  Pt reports having 2 falls (August 2nd, August 6th).  Pt notes that the first fall she  was trying to get out of the recliner and she fell forward.  Pt reports on the second fall, she missed a step. Pt reports she feels like she has POTS, and that it runs in her family as well  (granddaughte,r and grandson).   Pt had gabapentin  increased from 1 time in the morning and 1 time at night, to 2 at each time frame, and then recently switched to 3 in the morning and 3 at night. Pt reports she was told by MD to ambulate with a walker, however she has not been doing that.  PMH: asthma, CAD, DM2, heart murmur, L TKA, HTN, Depressive disorder,migraines  PAIN:  Are you having pain? 6/10 Rt hemi headache  PRECAUTIONS: Pt has hx of breast cancer and finished radiation back in May.    WEIGHT BEARING RESTRICTIONS: No  FALLS: Has patient fallen in last 6 months? Yes. Number of falls 2  LIVING ENVIRONMENT: Lives with: mother lives with pt Lives in: Apartment Stairs: Elevator Has following equipment at home: Single point cane and Environmental consultant - 2 wheeled  PLOF: Independent  PATIENT GOALS: Being less wobbly and feel more balances.  OBJECTIVE:  Note: Objective measures were completed at Evaluation unless otherwise noted.  DIAGNOSTIC FINDINGS: Pt to have an MRI tomorrow.    COGNITION: Overall cognitive status: Within functional limits for tasks assessed   SENSATION: WFL, how pt notes burning in the bottom of her feet.  Pt reports she has neuropathy in the feet.  COORDINATION: Not formally tested   ORTHOSTATICS:  Eval Supine - BP: 131/52  mmHG  Sitting - BP: 112/55 mmHg  Standing - BP: 96/53 mmHg  Standing +3 Min - BP: 133/70 mmHg    11/18/23  117/53mmHg 78bpm  supine  102/36mmHg 80bpm seated 0 118/28mmHg 84bpm seated x1 minute 87/69mmHg 84bpm standing x0 minutes 104/24mmHg 88bpm standing x1 minute 105/3mmHg 87bpm standing x2 minute 110/49mmHg 90bpm standing post [id number]    9/12:  Seated 121/63 HR 68bpm  Standing 0 min 120/60 HR79  Standing 1 min 127/69 HR 86  Following gait for 120ft 126/56 HR82     9/17:  Seated: 115/61 HR 76 Standing: 81/49 HR 79 mild lightheadedness Standing 1 min 101/60 HR 87  reduced s/s but still slightly present  9/24: Seated  132/64 HR 67  Standing : 116/61 HR 70   Standing 1 min: 131/63 HR 72  9/30:  -Seated: 119/54 HR 66 -Standing 0 min 110/58 HR 70 mild light headedness as well as tingling in leg -Standing 2 min: 121/65 HR 71   -Sitting:  124/52 HR 65    FUNCTIONAL TESTS:  5 times sit to stand: 19.04 sec Timed up and go (TUG): 14.68 sec 6 minute walk test:  9/10: tolerates on 469ft over 41m26sec before legs before increasingly ataxic  9/12: gait training with use of 4WW; tolerated 623ft, for 4:32, no significant ataxia noted, and only mild pain in sacrum area.  10 meter walk test: 15.32 sec; 0.65 m/s Dynamic Gait Index   PATIENT SURVEYS:  ABC Scale:  64% (9/10)  TREATMENT DATE: 12/16/23   Pt performed 5 time sit<>stand (5xSTS): 12.89 sec (>15 sec indicates increased fall risk)   PT instructed pt in TUG: 9.59 sec (average of 3 trials 11.06, 8.12 sec ; >13.5 sec indicates increased fall risk)  10 Meter Walk Test: Patient instructed to walk 10 meters (32.8 ft) as quickly and as safely as possible at their normal speed x2 and at a fast speed x2. Time measured from 2 meter mark to 8 meter mark to accommodate ramp-up and ramp-down.   Average Normal speed: 0.93 m/s Average Fast speed: 1.1 m/s Cut off scores: <0.4 m/s = household Ambulator, 0.4-0.8 m/s = limited community Ambulator, >0.8 m/s = community Ambulator, >1.2 m/s = crossing a street, <1.0 = increased fall risk MCID 0.05 m/s (small), 0.13 m/s (moderate), 0.06 m/s (significant)  (ANPTA Core Set of Outcome Measures for Adults with Neurologic Conditions, 2018)  PT instructed pt in DGI. See below for results. Demonstrates increased fall risk with score of 20/24. (<19 indicates increased fall risk)   Patient demonstrates increased fall risk as noted by score of 19/30 on  Functional Gait Assessment.   <22/30 = predictive of  falls, <20/30 = fall in 6 months, <18/30 = predictive of falls in PD MCID: 5 points stroke population, 4 points geriatric population (ANPTA Core Set of Outcome Measures for Adults with Neurologic Conditions, 2018)  6 Min Walk Test:  Instructed patient to ambulate as quickly and as safely as possible for 6 minutes using LRAD. Patient was allowed to take standing rest breaks without stopping the test, but if the patient required a sitting rest break the clock would be stopped and the test would be over.  Results: 935 feetusing no AD with supervision assist. Results indicate that the patient has reduced endurance with ambulation compared to age matched norms.  Age Matched Norms: 23-69 yo M: 28 F: 89, 48-79 yo M: 37 F: 471, 59-89 yo M: 417 F: 392 MDC: 58.21 meters (190.98 feet) or 50 meters (ANPTA Core Set of Outcome Measures for Adults with Neurologic Conditions, 2018)    Throughout PT session, PT provided CGA for safety intermittently with gait and dynamic balance/gait to improve safety and reduced fall risk.   PATIENT EDUCATION: Education details: Pt educated on role of PT and services provided during current POC, along with prognosis and information about the clinic.  Person educated: Patient Education method: Explanation Education comprehension: verbalized understanding  HOME EXERCISE PROGRAM: Access Code: [email or web address] URL: [email or web address] Date: 12/08/2023 Prepared by: Massie Dollar  Exercises - Backward Walking with Counter Support  - 1 x daily - 5 x weekly - 3 sets - 10 reps - Seated Gaze Stabilization with Head Rotation  - 1 x daily - 5 x weekly - 3 sets - 10 reps - Seated Gaze Stabilization with Head Nod  - 1 x daily - 5 x weekly - 3 sets - 10 reps - Sit to Stand with Arms Crossed  - 1 x daily - 5 x weekly - 3 sets - 10 reps - Side Stepping with Counter Support  - 1 x daily - 5 x weekly - 3 sets - 10 reps  GOALS: Goals reviewed with patient? Yes  SHORT  TERM GOALS: Target date: 12/03/2023   Pt will be independent with HEP in order to demonstrate increased ability to perform tasks related to occupation/hobbies. Baseline: to be given at subsequent visit Goal status: INITIAL   LONG TERM GOALS: Target date: 01/28/2024  1.  Patient (>  74 years old) will complete five times sit to stand test in < 15 seconds indicating an increased LE strength and improved balance. Baseline: 19.04 sec 10/8: 12.89 sec  Goal status: MET  2.  Pt will improve ABC by at least 13% in order to demonstrate clinically significant improvement in balance confidence.  Baseline: 64% (9/10)   10/8: 71.25% Goal status: in progress   3.  Pt will improve FGA by at least 3 points in order to demonstrate clinically significant improvement in balance and decreased risk for falls. Baseline: 12/16/23: 19 Goal status: adjusted/INITIAL   4.  Patient will reduce timed up and go to <11 seconds to reduce fall risk and demonstrate improved transfer/gait ability. Baseline: 14.68 sec 10/8: 9.59 sec without AD. Goal status: MET  5.  Patient will increase 10 meter walk test to >1.76m/s as to improve gait speed for better community ambulation and to reduce fall risk. Baseline: 15.32 sec; 0.65 m/s 10/8: normal speed: 0.93 m/s. Average Fast speed: 1.1 m/s Goal status:   6.  Patient will increase six minute walk test distance to >1000 for progression to community ambulator and improve gait ability Baseline: 9/10: tolerates on 495ft over 73m26sec before legs before increasingly ataxic  10/8: 939ft without UE support  Goal status: In progress   ASSESSMENT:  CLINICAL IMPRESSION:  Patient arrived with good motivation for completion of pt activities.  PT treatment consisted of LTG assessment for progress note. Noted to have improved balance and reduced fall risk evidenced by improved 5xSTS <15 sec. Reduced TUG time <11 sec. Improved ABC by 8% from baseline. Is also noted to improve improved  distance on  6 min walk test by >335ft, but still >aged matched norms. Pt will continue to benefit from skilled physical therapy intervention to address impairments, improve QOL, and attain therapy goals. Patient's condition has the potential to improve in response to therapy. Maximum improvement is yet to be obtained. The anticipated improvement is attainable and reasonable in a generally predictable time.     OBJECTIVE IMPAIRMENTS: Abnormal gait, decreased balance, decreased mobility, difficulty walking, dizziness, and improper body mechanics.   ACTIVITY LIMITATIONS: lifting, sitting, standing, stairs, transfers, reach over head, and locomotion level  PARTICIPATION LIMITATIONS: meal prep, cleaning, laundry, driving, shopping, community activity, and yard work  PERSONAL FACTORS: Age, Time since onset of injury/illness/exacerbation, and 3+ comorbidities: asthma, CAD, DM2, heart murmur, L TKA, HTN, Depressive disorder,migraines are also affecting patient's functional outcome.   REHAB POTENTIAL: Good  CLINICAL DECISION MAKING: Evolving/moderate complexity  EVALUATION COMPLEXITY: Moderate  PLAN:  PT FREQUENCY: 2x/week  PT DURATION: 12 weeks  PLANNED INTERVENTIONS: 97750- Physical Performance Testing, 97110-Therapeutic exercises, 97530- Therapeutic activity, [id number]- Neuromuscular re-education, 97535- Self Care, [id number]- Manual therapy, [id number]- Gait training, [phone]- Canalith repositioning, Balance training, and Stair training  PLAN FOR NEXT SESSION:  Continue Vestibular and balance habituation interventions.   Activity tolerance  training.    Massie Dollar PT, DPT  Physical Therapist - [place]  [facility]  8:54 AM 12/16/23

## 2023-12-18 ENCOUNTER — Ambulatory Visit: Admitting: Physical Therapy

## 2023-12-18 DIAGNOSIS — R2689 Other abnormalities of gait and mobility: Secondary | ICD-10-CM

## 2023-12-18 DIAGNOSIS — M6281 Muscle weakness (generalized): Secondary | ICD-10-CM

## 2023-12-18 DIAGNOSIS — R262 Difficulty in walking, not elsewhere classified: Secondary | ICD-10-CM

## 2023-12-18 DIAGNOSIS — R293 Abnormal posture: Secondary | ICD-10-CM

## 2023-12-18 NOTE — Therapy (Signed)
 OUTPATIENT PHYSICAL THERAPY NEURO/  PHYSICAL THERAPY PROGRESS NOTE   Dates of reporting period  11/05/23   to   12/16/23    Patient Name: Bianca Rogers MRN: [id number] DOB:09/21/1950, 73 y.o., female Today's Date: 12/18/2023  PCP: Vincente Shivers, NP  REFERRING PROVIDER: Lane Arthea BRAVO, MD   END OF SESSION:      Past Medical History:  Diagnosis Date   Anal fissure 12/09/2016   Added automatically from request for surgery [id number]    Added automatically from request for surgery [id number]     Arthralgia of left knee 12/03/2020   Asthma 08/29/2022   Chronic/stable.    Follows with Dr. Elayne, pulmonology.   Using Flovent  and albuterol  inhaler as needed.     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     Heart murmur    History of total knee arthroplasty 08/21/2021   Hyperlipidemia 08/29/2022   Lab Results   Component Value Date    Cholesterol 197 01/28/2022      Lab Results   Component Value Date    HDL 56 01/28/2022      Lab Results   Component Value Date    LDL Calculated 115 01/28/2022      Lab Results   Component Value Date    Triglycerides 131 01/28/2022      Lab Results   Component Value Date    Chol/HDL Ratio 3.5 01/28/2022      Criteria used to determine recommendation include:    Hypertension    Invasive carcinoma of breast (HCC)    Liver cirrhosis secondary to NASH (HCC)    Lung nodule    Major depressive disorder in full remission 04/02/2020   Mood stable on venlafaxine  150 mg daily.     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 breast of female, estrogen receptor positive (HCC)    Migraine 08/29/2022   NAFLD  (nonalcoholic fatty liver disease) 92/76/7985   Non-alcoholic cirrhosis (HCC)    Osteoarthritis of left knee 09/03/2020   Peritoneal hematoma 07/07/2019   Sprain of MCL (medial collateral ligament) of knee 09/03/2020   Symptom associated with female genital organs 07/09/2010   Past Surgical History:  Procedure Laterality Date   ABDOMINAL HYSTERECTOMY     AXILLARY SENTINEL NODE BIOPSY Left 04/22/2023   Procedure: AXILLARY SENTINEL NODE BIOPSY;  Surgeon: Lane Shope, MD;  Location: [facility];  Service: General;  Laterality: Left;   BREAST BIOPSY Left 03/31/2023   Us  Core bx, coil clip - path pending   BREAST BIOPSY Left 03/31/2023   US  LT BREAST BX W LOC DEV 1ST LESION IMG BX SPEC US  GUIDE 03/31/2023 [facility]   BREAST BIOPSY Left 04/20/2023   US  LT RADIO FREQUENCY TAG LOC US  GUIDE 04/20/2023 [facility]   BREAST LUMPECTOMY WITH RADIOFREQUENCY TAG IDENTIFICATION Left 04/22/2023   Procedure: BREAST LUMPECTOMY WITH RADIOFREQUENCY TAG IDENTIFICATION;  Surgeon: Lane Shope, MD;  Location: [facility];  Service: General;  Laterality: Left;   CARDIAC CATHETERIZATION  03/10/2012   Negative   CATARACT EXTRACTION Bilateral    CESAREAN SECTION     CHOLECYSTECTOMY     RE-EXCISION OF BREAST LUMPECTOMY  Left 05/20/2023   Procedure: EXCISION, LESION, BREAST, REPEAT;  Surgeon: Lane Shope, MD;  Location: [facility];  Service: General;  Laterality: Left;   SPLENECTOMY     TONSILLECTOMY     Patient Active Problem List   Diagnosis Date Noted   Dizziness 11/29/2023   Cerebrovascular small vessel disease 11/25/2023   Chronic nonintractable headache 09/08/2023   Osteopenia 05/27/2023   Genetic testing 05/15/2023   Change in facial mole 04/17/2023   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  Rationale for Evaluation and Treatment: Rehabilitation  SUBJECTIVE:                                                                                                                                                                                             SUBJECTIVE STATEMENT:  Pt reports that she is doing well. No pain this AM.  Pt did not break SPC on this day.   Pt accompanied by: self  PERTINENT HISTORY:   Pt reports she has had PT in [place] before for her neck and after her L TKA.  Pt reports that she has been having neck pain and headaches to the point where she will be having an MRI tomorrow.  Pt reports having 2 falls (August 2nd, August 6th).  Pt notes that the first fall she was trying to get out of the recliner and she fell forward.  Pt reports on the second fall, she missed a step. Pt reports she feels like she has POTS, and that it runs in her family as well (granddaughter and grandson).   Pt had gabapentin  increased from 1 time in the morning and 1 time at night, to 2 at each time frame, and then recently switched to 3 in the morning and 3 at night. Pt reports she was told by MD to ambulate with a walker, however she has not been doing that.  PMH: asthma, CAD, DM2, heart murmur, L TKA, HTN, Depressive disorder,migraines  PAIN:  Are you having pain? 6/10 Rt hemi headache  PRECAUTIONS: Pt has hx of breast cancer and finished radiation back in  May.    WEIGHT BEARING RESTRICTIONS: No  FALLS: Has patient fallen in last 6 months? Yes. Number of falls 2  LIVING ENVIRONMENT: Lives with: mother lives with pt Lives in:  Apartment Stairs: Elevator Has following equipment at home: Single point cane and Environmental consultant - 2 wheeled  PLOF: Independent  PATIENT GOALS: Being less wobbly and feel more balances.  OBJECTIVE:  Note: Objective measures were completed at Evaluation unless otherwise noted.  DIAGNOSTIC FINDINGS: Pt to have an MRI tomorrow.    COGNITION: Overall cognitive status: Within functional limits for tasks assessed   SENSATION: WFL, how pt notes burning in the bottom of her feet.  Pt reports she has neuropathy in the feet.  COORDINATION: Not formally tested   ORTHOSTATICS:  Eval Supine - BP: 131/52  mmHG  Sitting - BP: 112/55 mmHg  Standing - BP: 96/53 mmHg  Standing +3 Min - BP: 133/70 mmHg    11/18/23  117/62mmHg 78bpm  supine  102/77mmHg 80bpm seated 0 118/86mmHg 84bpm seated x1 minute 87/66mmHg 84bpm standing x0 minutes 104/24mmHg 88bpm standing x1 minute 105/50mmHg 87bpm standing x2 minute 110/14mmHg 90bpm standing post [id number]    9/12:  Seated 121/63 HR 68bpm  Standing 0 min 120/60 HR79  Standing 1 min 127/69 HR 86  Following gait for 133ft 126/56 HR82     9/17:  Seated: 115/61 HR 76 Standing: 81/49 HR 79 mild lightheadedness Standing 1 min 101/60 HR 87  reduced s/s but still slightly present  9/24: Seated 132/64 HR 67  Standing : 116/61 HR 70   Standing 1 min: 131/63 HR 72  9/30:  -Seated: 119/54 HR 66 -Standing 0 min 110/58 HR 70 mild light headedness as well as tingling in leg -Standing 2 min: 121/65 HR 71   -Sitting:  124/52 HR 65    FUNCTIONAL TESTS:  5 times sit to stand: 19.04 sec Timed up and go (TUG): 14.68 sec 6 minute walk test:  9/10: tolerates on 490ft over 61m26sec before legs before increasingly ataxic  9/12: gait training with use of 4WW; tolerated 670ft, for 4:32, no significant ataxia noted, and only mild pain in sacrum area.  10 meter walk test: 15.32 sec; 0.65 m/s Dynamic Gait Index   PATIENT SURVEYS:  ABC Scale:  64% (9/10)                                                                                                                                TREATMENT DATE: 12/18/23  No reports of dizziness on this day  Nustep reciprocal activity tolerance training x 8 min rolling hill program level 3-8 with instruction to maintain >50 SPM through varied resistance.   Lateral step ups. X 12 bil with light UE support with LLE Reciprocal foot taps on 6inch step x 15 bil no UE support 1 foot elevated on 6inch step 2 x 30 sec   1 foot elevated on 6inhc step with head turns and visual  scanning x 15 bil   Forward/reverse gait 60ft x 4  Forward/reverse gait  with lateral head turn 52ft x 2 bil   Weighted gait with out UE support. 3# AW 185ft x 2 with performed one CW and one CCW. + 340ft  with SPC to weave through equipment in gym and through hall way.   Throughout PT session, PT provided CGA for safety intermittently with gait and dynamic balance/gait to improve safety and reduced fall risk.   PATIENT EDUCATION: Education details: Pt educated on role of PT and services provided during current POC, along with prognosis and information about the clinic.  Person educated: Patient Education method: Explanation Education comprehension: verbalized understanding  HOME EXERCISE PROGRAM: Access Code: [email or web address] URL: [email or web address] Date: 12/08/2023 Prepared by: Massie Dollar  Exercises - Backward Walking with Counter Support  - 1 x daily - 5 x weekly - 3 sets - 10 reps - Seated Gaze Stabilization with Head Rotation  - 1 x daily - 5 x weekly - 3 sets - 10 reps - Seated Gaze Stabilization with Head Nod  - 1 x daily - 5 x weekly - 3 sets - 10 reps - Sit to Stand with Arms Crossed  - 1 x daily - 5 x weekly - 3 sets - 10 reps - Side Stepping with Counter Support  - 1 x daily - 5 x weekly - 3 sets - 10 reps  GOALS: Goals reviewed with patient? Yes  SHORT TERM GOALS: Target date: 12/03/2023   Pt will be independent  with HEP in order to demonstrate increased ability to perform tasks related to occupation/hobbies. Baseline: to be given at subsequent visit Goal status: INITIAL   LONG TERM GOALS: Target date: 01/28/2024  1.  Patient (> 12 years old) will complete five times sit to stand test in < 15 seconds indicating an increased LE strength and improved balance. Baseline: 19.04 sec 10/8: 12.89 sec  Goal status: MET  2.  Pt will improve ABC by at least 13% in order to demonstrate clinically significant improvement in balance confidence.  Baseline: 64% (9/10)   10/8: 71.25% Goal status: in progress   3.  Pt will improve FGA by at least 3 points in order to demonstrate clinically significant improvement in balance and decreased risk for falls. Baseline: 12/16/23: 19 Goal status: adjusted/INITIAL   4.  Patient will reduce timed up and go to <11 seconds to reduce fall risk and demonstrate improved transfer/gait ability. Baseline: 14.68 sec 10/8: 9.59 sec without AD. Goal status: MET  5.  Patient will increase 10 meter walk test to >1.48m/s as to improve gait speed for better community ambulation and to reduce fall risk. Baseline: 15.32 sec; 0.65 m/s 10/8: normal speed: 0.93 m/s. Average Fast speed: 1.1 m/s Goal status:   6.  Patient will increase six minute walk test distance to >1000 for progression to community ambulator and improve gait ability Baseline: 9/10: tolerates on 426ft over 83m26sec before legs before increasingly ataxic  10/8: 961ft without UE support  Goal status: In progress   ASSESSMENT:  CLINICAL IMPRESSION:  Patient arrived with good motivation for completion of pt activities.  PT treatment focused dynamic balance training and gait with and without UE support to simulate community mobility. Tolerated gait with head turns in forward/reverse without significant LOB.  Pt will continue to benefit from skilled PT to address balance and gait deficits to improve overall safety with  mobility and improve overall QoL.     OBJECTIVE IMPAIRMENTS:  Abnormal gait, decreased balance, decreased mobility, difficulty walking, dizziness, and improper body mechanics.   ACTIVITY LIMITATIONS: lifting, sitting, standing, stairs, transfers, reach over head, and locomotion level  PARTICIPATION LIMITATIONS: meal prep, cleaning, laundry, driving, shopping, community activity, and yard work  PERSONAL FACTORS: Age, Time since onset of injury/illness/exacerbation, and 3+ comorbidities: asthma, CAD, DM2, heart murmur, L TKA, HTN, Depressive disorder,migraines are also affecting patient's functional outcome.   REHAB POTENTIAL: Good  CLINICAL DECISION MAKING: Evolving/moderate complexity  EVALUATION COMPLEXITY: Moderate  PLAN:  PT FREQUENCY: 2x/week  PT DURATION: 12 weeks  PLANNED INTERVENTIONS: 97750- Physical Performance Testing, 97110-Therapeutic exercises, 97530- Therapeutic activity, [id number]- Neuromuscular re-education, 97535- Self Care, [id number]- Manual therapy, [id number]- Gait training, [phone]- Canalith repositioning, Balance training, and Stair training  PLAN FOR NEXT SESSION:   Continue Vestibular and balance habituation interventions.   Activity tolerance  training.    Massie Dollar PT, DPT  Physical Therapist - [place]  [facility]  8:51 AM 12/18/23

## 2023-12-23 ENCOUNTER — Ambulatory Visit: Admitting: Physical Therapy

## 2023-12-23 DIAGNOSIS — R293 Abnormal posture: Secondary | ICD-10-CM

## 2023-12-23 DIAGNOSIS — R262 Difficulty in walking, not elsewhere classified: Secondary | ICD-10-CM | POA: Diagnosis not present

## 2023-12-23 DIAGNOSIS — R2689 Other abnormalities of gait and mobility: Secondary | ICD-10-CM

## 2023-12-23 DIAGNOSIS — M6281 Muscle weakness (generalized): Secondary | ICD-10-CM

## 2023-12-23 NOTE — Therapy (Signed)
 OUTPATIENT PHYSICAL THERAPY NEURO/    Patient Name: Bianca Rogers MRN: [id number] DOB:21-Nov-1950, 73 y.o., female Today's Date: 12/23/2023  PCP: Vincente Shivers, NP  REFERRING PROVIDER: Lane Arthea BRAVO, MD   END OF SESSION:  PT End of Session - 12/23/23 0845     Visit Number 12    Number of Visits 25    Date for Recertification  01/28/24    Authorization Type [employer] medic[employer]    Authorization Time Period 11/05/23-02/07/24 24 visits    Authorization - Number of Visits 24    Progress Note Due on Visit 10    PT Start Time 0846    PT Stop Time 0925    PT Time Calculation (min) 39 min    Equipment Utilized During Treatment Gait belt    Activity Tolerance Patient tolerated treatment well;No increased pain    Behavior During Therapy [facility] for tasks assessed/performed             Past Medical History:  Diagnosis Date   Anal fissure 12/09/2016   Added automatically from request for surgery [id number]    Added automatically from request for surgery [id number]     Arthralgia of left knee 12/03/2020   Asthma 08/29/2022   Chronic/stable.    Follows with Dr. Elayne, pulmonology.   Using Flovent  and albuterol  inhaler as needed.     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     Heart murmur    History of total knee arthroplasty 08/21/2021   Hyperlipidemia 08/29/2022   Lab Results   Component Value Date    Cholesterol 197 01/28/2022      Lab Results   Component Value Date    HDL 56 01/28/2022      Lab Results   Component Value Date    LDL Calculated 115 01/28/2022      Lab Results   Component Value Date    Triglycerides 131 01/28/2022      Lab  Results   Component Value Date    Chol/HDL Ratio 3.5 01/28/2022      Criteria used to determine recommendation include:    Hypertension    Invasive carcinoma of breast (HCC)    Liver cirrhosis secondary to NASH (HCC)    Lung nodule    Major depressive disorder in full remission 04/02/2020   Mood stable on venlafaxine  150 mg daily.     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 breast of female, estrogen receptor positive (HCC)    Migraine 08/29/2022   NAFLD (nonalcoholic fatty liver disease) 92/76/7985   Non-alcoholic cirrhosis (HCC)    Osteoarthritis of left knee 09/03/2020   Peritoneal hematoma 07/07/2019   Sprain of MCL (medial collateral ligament) of knee 09/03/2020   Symptom associated with female genital organs 07/09/2010   Past Surgical History:  Procedure Laterality Date   ABDOMINAL HYSTERECTOMY     AXILLARY SENTINEL NODE BIOPSY Left 04/22/2023   Procedure: AXILLARY SENTINEL NODE BIOPSY;  Surgeon: Lane Shope, MD;  Location: [facility] ORS;  Service: General;  Laterality: Left;   BREAST BIOPSY Left 03/31/2023   Us  Core bx, coil clip - path pending   BREAST  BIOPSY Left 03/31/2023   US  LT BREAST BX W LOC DEV 1ST LESION IMG BX SPEC US  GUIDE 03/31/2023 [facility]   BREAST BIOPSY Left 04/20/2023   US  LT RADIO FREQUENCY TAG LOC US  GUIDE 04/20/2023 [facility]   BREAST LUMPECTOMY WITH RADIOFREQUENCY TAG IDENTIFICATION Left 04/22/2023   Procedure: BREAST LUMPECTOMY WITH RADIOFREQUENCY TAG IDENTIFICATION;  Surgeon: Lane Shope, MD;  Location: [facility] ORS;  Service: General;  Laterality: Left;   CARDIAC CATHETERIZATION  03/10/2012   Negative   CATARACT EXTRACTION Bilateral    CESAREAN SECTION     CHOLECYSTECTOMY     RE-EXCISION OF BREAST LUMPECTOMY Left 05/20/2023   Procedure: EXCISION, LESION, BREAST, REPEAT;  Surgeon: Lane Shope, MD;  Location: [facility] ORS;  Service: General;  Laterality: Left;   SPLENECTOMY     TONSILLECTOMY     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 breast of female, estrogen receptor positive (HCC) 04/09/2023   Invasive carcinoma of breast (HCC) 04/08/2023   Family history of breast cancer 04/08/2023   Gastroesophageal reflux disease 03/02/2023   Liver cirrhosis secondary to NASH (HCC) 03/02/2023   Bronchiectasis (HCC) 06/09/2013   Type 2 diabetes mellitus with diabetic neuropathy (HCC) 06/09/2013   HTN (hypertension), benign 06/09/2013    ONSET DATE: 10/09/23  REFERRING DIAG: R29.6 (ICD-10-CM) - Frequent falls   THERAPY DIAG:  Difficulty in walking, not elsewhere classified  Abnormal posture  Muscle weakness (generalized)  Imbalance  Rationale for Evaluation and Treatment: Rehabilitation  SUBJECTIVE:                                                                                                                                                                                             SUBJECTIVE STATEMENT:  Pt reports that she is doing well. States that she bumped her bad knee (L) on the sewing table last night. Reports moderate pain last night and some burning this AM, but no pain at start of PT treatment.  Pt accompanied by: self  PERTINENT HISTORY:   Pt reports she has had PT in [place] before for her neck and after her L TKA.  Pt reports that she has been having neck pain and headaches to the point where she will be having an MRI tomorrow.  Pt reports having 2 falls (August 2nd, August 6th).  Pt notes that the first fall  she was trying to get out of the recliner and she fell forward.  Pt reports on the second fall, she missed a step. Pt reports she feels like she has POTS, and that it runs in her family as  well (granddaughter and grandson).   Pt had gabapentin  increased from 1 time in the morning and 1 time at night, to 2 at each time frame, and then recently switched to 3 in the morning and 3 at night. Pt reports she was told by MD to ambulate with a walker, however she has not been doing that.  PMH: asthma, CAD, DM2, heart murmur, L TKA, HTN, Depressive disorder,migraines  PAIN:  Are you having pain? 6/10 Rt hemi headache  PRECAUTIONS: Pt has hx of breast cancer and finished radiation back in May.    WEIGHT BEARING RESTRICTIONS: No  FALLS: Has patient fallen in last 6 months? Yes. Number of falls 2  LIVING ENVIRONMENT: Lives with: mother lives with pt Lives in: Apartment Stairs: Elevator Has following equipment at home: Single point cane and Environmental consultant - 2 wheeled  PLOF: Independent  PATIENT GOALS: Being less wobbly and feel more balances.  OBJECTIVE:  Note: Objective measures were completed at Evaluation unless otherwise noted.  DIAGNOSTIC FINDINGS: Pt to have an MRI tomorrow.    COGNITION: Overall cognitive status: Within functional limits for tasks assessed   SENSATION: WFL, how pt notes burning in the bottom of her feet.  Pt reports she has neuropathy in the feet.  COORDINATION: Not formally tested   ORTHOSTATICS:  Eval Supine - BP: 131/52  mmHG  Sitting - BP: 112/55 mmHg  Standing - BP: 96/53 mmHg  Standing +3 Min - BP: 133/70 mmHg    11/18/23  117/29mmHg 78bpm  supine  102/25mmHg 80bpm seated 0 118/65mmHg 84bpm seated x1 minute 87/60mmHg 84bpm standing x0 minutes 104/69mmHg 88bpm standing x1 minute 105/92mmHg 87bpm standing x2 minute 110/57mmHg 90bpm standing post [id number]    9/12:  Seated 121/63 HR 68bpm  Standing 0 min 120/60 HR79  Standing 1 min 127/69 HR 86  Following gait for 193ft 126/56 HR82     9/17:  Seated: 115/61 HR 76 Standing: 81/49 HR 79 mild lightheadedness Standing 1 min 101/60 HR 87  reduced s/s but still slightly present  9/24:  Seated 132/64 HR 67  Standing : 116/61 HR 70   Standing 1 min: 131/63 HR 72  9/30:  -Seated: 119/54 HR 66 -Standing 0 min 110/58 HR 70 mild light headedness as well as tingling in leg -Standing 2 min: 121/65 HR 71   -Sitting:  124/52 HR 65    FUNCTIONAL TESTS:  5 times sit to stand: 19.04 sec Timed up and go (TUG): 14.68 sec 6 minute walk test:  9/10: tolerates on 468ft over 43m26sec before legs before increasingly ataxic  9/12: gait training with use of 4WW; tolerated 633ft, for 4:32, no significant ataxia noted, and only mild pain in sacrum area.  10 meter walk test: 15.32 sec; 0.65 m/s Dynamic Gait Index   PATIENT SURVEYS:  ABC Scale:  64% (9/10)  TREATMENT DATE: 12/23/23  No reports of dizziness on this day  Nustep reciprocal activity tolerance training x 8 min rolling hill program level 3-8 with instruction to maintain >50 SPM through varied resistance.   Gait without UE support in hall for vestibular habituation with visual scanning and VOR cancellation.  Forwards with distance gaze at end of the hall 2 x 90 ft Forward/reverse gait with gaze on end of the hall. X 90ft each Forward/reverse gait with saccades to look at card and name color and suit x 90 ft each Forward reverse gait with head turns to locate and name letters on the wall. X 90 ft each  Standing on airex pad:  VOR horz ~ 1 min .   VOR vert  ~ 1 min .  Improved consistency of gaze stabilization with increased repetitions.   Lateral step up to airex pad on 4inch step x 8 bil light UE support progressing to no no Ue support  Partial lunge to airex pad on 4inch step x 10 bil no UE support   Throughout PT session, PT provided CGA for safety intermittently with gait and dynamic balance/gait to improve safety and reduced fall risk.   PATIENT EDUCATION: Education details: Pt educated on  role of PT and services provided during current POC, along with prognosis and information about the clinic.  Person educated: Patient Education method: Explanation Education comprehension: verbalized understanding  HOME EXERCISE PROGRAM: Access Code: [email or web address] URL: [email or web address] Date: 12/08/2023 Prepared by: Massie Dollar  Exercises - Backward Walking with Counter Support  - 1 x daily - 5 x weekly - 3 sets - 10 reps - Seated Gaze Stabilization with Head Rotation  - 1 x daily - 5 x weekly - 3 sets - 10 reps - Seated Gaze Stabilization with Head Nod  - 1 x daily - 5 x weekly - 3 sets - 10 reps - Sit to Stand with Arms Crossed  - 1 x daily - 5 x weekly - 3 sets - 10 reps - Side Stepping with Counter Support  - 1 x daily - 5 x weekly - 3 sets - 10 reps  GOALS: Goals reviewed with patient? Yes  SHORT TERM GOALS: Target date: 12/03/2023   Pt will be independent with HEP in order to demonstrate increased ability to perform tasks related to occupation/hobbies. Baseline: to be given at subsequent visit Goal status: INITIAL   LONG TERM GOALS: Target date: 01/28/2024  1.  Patient (> 44 years old) will complete five times sit to stand test in < 15 seconds indicating an increased LE strength and improved balance. Baseline: 19.04 sec 10/8: 12.89 sec  Goal status: MET  2.  Pt will improve ABC by at least 13% in order to demonstrate clinically significant improvement in balance confidence.  Baseline: 64% (9/10)   10/8: 71.25% Goal status: in progress   3.  Pt will improve FGA by at least 3 points in order to demonstrate clinically significant improvement in balance and decreased risk for falls. Baseline: 12/16/23: 19 Goal status: adjusted/INITIAL   4.  Patient will reduce timed up and go to <11 seconds to reduce fall risk and demonstrate improved transfer/gait ability. Baseline: 14.68 sec 10/8: 9.59 sec without AD. Goal status: MET  5.  Patient will increase 10  meter walk test to >1.76m/s as to improve gait speed for better community ambulation and to reduce fall risk. Baseline: 15.32 sec; 0.65 m/s 10/8: normal speed: 0.93 m/s. Average Fast speed: 1.1 m/s Goal status:  In progress  6.  Patient will increase six minute walk test distance to >1000 for progression to community ambulator and improve gait ability Baseline: 9/10: tolerates on 470ft over 70m26sec before legs before increasingly ataxic  10/8: 965ft without UE support  Goal status: In progress   ASSESSMENT:  CLINICAL IMPRESSION:  Patient arrived with good motivation for completion of pt activities.  PT treatment continued to focus dynamic balance training and gait with and without UE and increased vestibular input. Pt demonstrates improved saccade control compared to gaze stabilization with VOR, but improved from prior sessions and also improved within session. No overt LOB with forward/reverse gait and only occasional lateral deviation with reverse gait; which pt was able to correct without assist. Tolerated gait with head turns in forward/reverse without significant LOB.  Pt will continue to benefit from skilled PT to address balance and gait deficits to improve overall safety with mobility and improve overall QoL.     OBJECTIVE IMPAIRMENTS: Abnormal gait, decreased balance, decreased mobility, difficulty walking, dizziness, and improper body mechanics.   ACTIVITY LIMITATIONS: lifting, sitting, standing, stairs, transfers, reach over head, and locomotion level  PARTICIPATION LIMITATIONS: meal prep, cleaning, laundry, driving, shopping, community activity, and yard work  PERSONAL FACTORS: Age, Time since onset of injury/illness/exacerbation, and 3+ comorbidities: asthma, CAD, DM2, heart murmur, L TKA, HTN, Depressive disorder,migraines are also affecting patient's functional outcome.   REHAB POTENTIAL: Good  CLINICAL DECISION MAKING: Evolving/moderate complexity  EVALUATION COMPLEXITY:  Moderate  PLAN:  PT FREQUENCY: 2x/week  PT DURATION: 12 weeks  PLANNED INTERVENTIONS: 97750- Physical Performance Testing, 97110-Therapeutic exercises, 97530- Therapeutic activity, [id number]- Neuromuscular re-education, 97535- Self Care, [id number]- Manual therapy, [id number]- Gait training, [phone]- Canalith repositioning, Balance training, and Stair training  PLAN FOR NEXT SESSION:   Continue Vestibular and balance habituation interventions.   Activity tolerance  training.    Massie Dollar PT, DPT  Physical Therapist - [facility]  8:47 AM 12/23/23

## 2023-12-25 ENCOUNTER — Ambulatory Visit: Admitting: Physical Therapy

## 2023-12-25 DIAGNOSIS — R293 Abnormal posture: Secondary | ICD-10-CM

## 2023-12-25 DIAGNOSIS — R262 Difficulty in walking, not elsewhere classified: Secondary | ICD-10-CM | POA: Diagnosis not present

## 2023-12-25 DIAGNOSIS — R2689 Other abnormalities of gait and mobility: Secondary | ICD-10-CM

## 2023-12-25 DIAGNOSIS — M6281 Muscle weakness (generalized): Secondary | ICD-10-CM

## 2023-12-25 NOTE — Therapy (Signed)
 OUTPATIENT PHYSICAL THERAPY NEURO/    Patient Name: Bianca Rogers MRN: [id number] DOB:05-Nov-1950, 73 y.o., female Today's Date: 12/25/2023  PCP: Vincente Shivers, NP  REFERRING PROVIDER: Lane Arthea BRAVO, MD   END OF SESSION:  PT End of Session - 12/25/23 0854     Visit Number 11    Number of Visits 25    Date for Recertification  01/28/24    Authorization Type [employer] medic[employer]    Authorization Time Period 11/05/23-02/07/24 24 visits    Authorization - Number of Visits 24    Progress Note Due on Visit 10    PT Start Time 0851    PT Stop Time 0930    PT Time Calculation (min) 39 min    Equipment Utilized During Treatment Gait belt    Activity Tolerance Patient tolerated treatment well;No increased pain    Behavior During Therapy [facility] for tasks assessed/performed             Past Medical History:  Diagnosis Date   Anal fissure 12/09/2016   Added automatically from request for surgery [id number]    Added automatically from request for surgery [id number]     Arthralgia of left knee 12/03/2020   Asthma 08/29/2022   Chronic/stable.    Follows with Dr. Elayne, pulmonology.   Using Flovent  and albuterol  inhaler as needed.     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     Heart murmur    History of total knee arthroplasty 08/21/2021   Hyperlipidemia 08/29/2022   Lab Results   Component Value Date    Cholesterol 197 01/28/2022      Lab Results   Component Value Date    HDL 56 01/28/2022      Lab Results   Component Value Date    LDL Calculated 115 01/28/2022      Lab Results   Component Value Date    Triglycerides 131 01/28/2022      Lab  Results   Component Value Date    Chol/HDL Ratio 3.5 01/28/2022      Criteria used to determine recommendation include:    Hypertension    Invasive carcinoma of breast (HCC)    Liver cirrhosis secondary to NASH (HCC)    Lung nodule    Major depressive disorder in full remission 04/02/2020   Mood stable on venlafaxine  150 mg daily.     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 breast of female, estrogen receptor positive (HCC)    Migraine 08/29/2022   NAFLD (nonalcoholic fatty liver disease) 92/76/7985   Non-alcoholic cirrhosis (HCC)    Osteoarthritis of left knee 09/03/2020   Peritoneal hematoma 07/07/2019   Sprain of MCL (medial collateral ligament) of knee 09/03/2020   Symptom associated with female genital organs 07/09/2010   Past Surgical History:  Procedure Laterality Date   ABDOMINAL HYSTERECTOMY     AXILLARY SENTINEL NODE BIOPSY Left 04/22/2023   Procedure: AXILLARY SENTINEL NODE BIOPSY;  Surgeon: Lane Shope, MD;  Location: [facility]S;  Service: General;  Laterality: Left;   BREAST BIOPSY Left 03/31/2023   Us  Core bx, coil clip - path pending   BREAST  BIOPSY Left 03/31/2023   US  LT BREAST BX W LOC DEV 1ST LESION IMG BX SPEC US  GUIDE 03/31/2023 [facility]   BREAST BIOPSY Left 04/20/2023   US  LT RADIO FREQUENCY TAG LOC US  GUIDE 04/20/2023 [facility]   BREAST LUMPECTOMY WITH RADIOFREQUENCY TAG IDENTIFICATION Left 04/22/2023   Procedure: BREAST LUMPECTOMY WITH RADIOFREQUENCY TAG IDENTIFICATION;  Surgeon: Lane Shope, MD;  Location: [facility] OR[facility];  Service: General;  Laterality: Left;   CARDIAC CATHETERIZATION  03/10/2012   Negative   CATARACT EXTRACTION Bilateral    CESAREAN SECTION     CHOLECYSTECTOMY     RE-EXCISION OF BREAST LUMPECTOMY Left 05/20/2023   Procedure: EXCISION, LESION, BREAST, REPEAT;  Surgeon: Lane Shope, MD;  Location: [facility];  Service: General;  Laterality: Left;   SPLENECTOMY     TONSILLECTOMY     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 breast of female, estrogen receptor positive (HCC) 04/09/2023   Invasive carcinoma of breast (HCC) 04/08/2023   Family history of breast cancer 04/08/2023   Gastroesophageal reflux disease 03/02/2023   Liver cirrhosis secondary to NASH (HCC) 03/02/2023   Bronchiectasis (HCC) 06/09/2013   Type 2 diabetes mellitus with diabetic neuropathy (HCC) 06/09/2013   HTN (hypertension), benign 06/09/2013    ONSET DATE: 10/09/23  REFERRING DIAG: R29.6 (ICD-10-CM) - Frequent falls   THERAPY DIAG:  Difficulty in walking, not elsewhere classified  Abnormal posture  Muscle weakness (generalized)  Imbalance  Rationale for Evaluation and Treatment: Rehabilitation  SUBJECTIVE:                                                                                                                                                                                             SUBJECTIVE STATEMENT:  Pt reports that she is doing well. Still has some stiffness in the L knee from bumping it on something last night, but no pain. Had increased burning in BLE last night, but feeling better this AM.    Pt accompanied by: self  PERTINENT HISTORY:   Pt reports she has had PT in [place] before for her neck and after her L TKA.  Pt reports that she has been having neck pain and headaches to the point where she will be having an MRI tomorrow.  Pt reports having 2 falls (August 2nd, August 6th).  Pt notes that the first fall  she was trying to get out of the recliner and she fell forward.  Pt reports on the second fall, she missed a step. Pt reports she feels like she has POTS, and that it runs in her family as well  (granddaughter and grandson).   Pt had gabapentin  increased from 1 time in the morning and 1 time at night, to 2 at each time frame, and then recently switched to 3 in the morning and 3 at night. Pt reports she was told by MD to ambulate with a walker, however she has not been doing that.  PMH: asthma, CAD, DM2, heart murmur, L TKA, HTN, Depressive disorder,migraines  PAIN:  Are you having pain? 6/10 Rt hemi headache  PRECAUTIONS: Pt has hx of breast cancer and finished radiation back in May.    WEIGHT BEARING RESTRICTIONS: No  FALLS: Has patient fallen in last 6 months? Yes. Number of falls 2  LIVING ENVIRONMENT: Lives with: mother lives with pt Lives in: Apartment Stairs: Elevator Has following equipment at home: Single point cane and Environmental consultant - 2 wheeled  PLOF: Independent  PATIENT GOALS: Being less wobbly and feel more balances.  OBJECTIVE:  Note: Objective measures were completed at Evaluation unless otherwise noted.  DIAGNOSTIC FINDINGS: Pt to have an MRI tomorrow.    COGNITION: Overall cognitive status: Within functional limits for tasks assessed   SENSATION: WFL, how pt notes burning in the bottom of her feet.  Pt reports she has neuropathy in the feet.  COORDINATION: Not formally tested   ORTHOSTATICS:  Eval Supine - BP: 131/52  mmHG  Sitting - BP: 112/55 mmHg  Standing - BP: 96/53 mmHg  Standing +3 Min - BP: 133/70 mmHg    11/18/23  117/18mmHg 78bpm  supine  102/83mmHg 80bpm seated 0 118/67mmHg 84bpm seated x1 minute 87/17mmHg 84bpm standing x0 minutes 104/23mmHg 88bpm standing x1 minute 105/47mmHg 87bpm standing x2 minute 110/35mmHg 90bpm standing post [id number]    9/12:  Seated 121/63 HR 68bpm  Standing 0 min 120/60 HR79  Standing 1 min 127/69 HR 86  Following gait for 184ft 126/56 HR82     9/17:  Seated: 115/61 HR 76 Standing: 81/49 HR 79 mild lightheadedness Standing 1 min 101/60 HR 87  reduced s/s but still slightly present  9/24: Seated  132/64 HR 67  Standing : 116/61 HR 70   Standing 1 min: 131/63 HR 72  9/30:  -Seated: 119/54 HR 66 -Standing 0 min 110/58 HR 70 mild light headedness as well as tingling in leg -Standing 2 min: 121/65 HR 71   -Sitting:  124/52 HR 65    FUNCTIONAL TESTS:  5 times sit to stand: 19.04 sec Timed up and go (TUG): 14.68 sec 6 minute walk test:  9/10: tolerates on 412ft over 74m26sec before legs before increasingly ataxic  9/12: gait training with use of 4WW; tolerated 643ft, for 4:32, no significant ataxia noted, and only mild pain in sacrum area.  10 meter walk test: 15.32 sec; 0.65 m/s Dynamic Gait Index   PATIENT SURVEYS:  ABC Scale:  64% (9/10)  TREATMENT DATE: 12/25/23  No reports of dizziness on this day.  Weighted gait with 3 # AW 391ft x 2 bouts. Min cues for improved gaze stabilization initially. No noticeable veering R or L except in first   Forward/reverse gait with 3#AW 34ft x 3. Min cues for improved step width and decreased step length to maintain symmetry. No LOB.   VOR:  2 x 10 nod and head turn. Significantly improved ability to maintain gaze stabilization throughout each bout on this day.   Ball pass from R and L and L to R x 12 bil with VOR cancellation head movements.   Weighted gait with 3#AW Gait holding ball x 145ft.  Ball raise to eye level x 145ft.  Standing ball toss to eye level x 45 sec.  Ball toss to eye level x 372ft.  AW only x 268ft  CGA for safety with min cues for proper step width and encouraged adjustment in step length to prevent ball from falling to the floor. Decreased lateral veering compared to prior sessions.  Throughout PT session, PT provided CGA for safety intermittently with gait and dynamic balance/gait to improve safety and reduced fall risk.   PATIENT EDUCATION: Education details: Pt educated on role of PT  and services provided during current POC, along with prognosis and information about the clinic.  Person educated: Patient Education method: Explanation Education comprehension: verbalized understanding  HOME EXERCISE PROGRAM: Access Code: [email or web address] URL: [email or web address] Date: 12/08/2023 Prepared by: Massie Dollar  Exercises - Backward Walking with Counter Support  - 1 x daily - 5 x weekly - 3 sets - 10 reps - Seated Gaze Stabilization with Head Rotation  - 1 x daily - 5 x weekly - 3 sets - 10 reps - Seated Gaze Stabilization with Head Nod  - 1 x daily - 5 x weekly - 3 sets - 10 reps - Sit to Stand with Arms Crossed  - 1 x daily - 5 x weekly - 3 sets - 10 reps - Side Stepping with Counter Support  - 1 x daily - 5 x weekly - 3 sets - 10 reps  GOALS: Goals reviewed with patient? Yes  SHORT TERM GOALS: Target date: 12/03/2023   Pt will be independent with HEP in order to demonstrate increased ability to perform tasks related to occupation/hobbies. Baseline: to be given at subsequent visit Goal status: INITIAL   LONG TERM GOALS: Target date: 01/28/2024  1.  Patient (> 13 years old) will complete five times sit to stand test in < 15 seconds indicating an increased LE strength and improved balance. Baseline: 19.04 sec 10/8: 12.89 sec  Goal status: MET  2.  Pt will improve ABC by at least 13% in order to demonstrate clinically significant improvement in balance confidence.  Baseline: 64% (9/10)   10/8: 71.25% Goal status: in progress   3.  Pt will improve FGA by at least 3 points in order to demonstrate clinically significant improvement in balance and decreased risk for falls. Baseline: 12/16/23: 19 Goal status: adjusted/INITIAL   4.  Patient will reduce timed up and go to <11 seconds to reduce fall risk and demonstrate improved transfer/gait ability. Baseline: 14.68 sec 10/8: 9.59 sec without AD. Goal status: MET  5.  Patient will increase 10 meter walk  test to >1.43m/s as to improve gait speed for better community ambulation and to reduce fall risk. Baseline: 15.32 sec; 0.65 m/s 10/8: normal speed: 0.93 m/s. Average Fast speed: 1.1 m/s Goal status: In  progress  6.  Patient will increase six minute walk test distance to >1000 for progression to community ambulator and improve gait ability Baseline: 9/10: tolerates on 472ft over 21m26sec before legs before increasingly ataxic  10/8: 961ft without UE support  Goal status: In progress   ASSESSMENT:  CLINICAL IMPRESSION:  Patient arrived with good motivation for completion of pt activities.  PT treatment continued to focus dynamic balance training and gait without UE and increased vestibular input including visual obstruction and gaze fixed on ball with toss. Greatly improved VOR with nods and rotation on this day.  Pt will continue to benefit from skilled PT to address balance and gait deficits to improve overall safety with mobility and improve overall QoL.     OBJECTIVE IMPAIRMENTS: Abnormal gait, decreased balance, decreased mobility, difficulty walking, dizziness, and improper body mechanics.   ACTIVITY LIMITATIONS: lifting, sitting, standing, stairs, transfers, reach over head, and locomotion level  PARTICIPATION LIMITATIONS: meal prep, cleaning, laundry, driving, shopping, community activity, and yard work  PERSONAL FACTORS: Age, Time since onset of injury/illness/exacerbation, and 3+ comorbidities: asthma, CAD, DM2, heart murmur, L TKA, HTN, Depressive disorder,migraines are also affecting patient's functional outcome.   REHAB POTENTIAL: Good  CLINICAL DECISION MAKING: Evolving/moderate complexity  EVALUATION COMPLEXITY: Moderate  PLAN:  PT FREQUENCY: 2x/week  PT DURATION: 12 weeks  PLANNED INTERVENTIONS: 97750- Physical Performance Testing, 97110-Therapeutic exercises, 97530- Therapeutic activity, [id number]- Neuromuscular re-education, 97535- Self Care, [id number]- Manual therapy,  [id number]- Gait training, [phone]- Canalith repositioning, Balance training, and Stair training  PLAN FOR NEXT SESSION:   Continue Vestibular and balance habituation interventions.   Activity tolerance  training.    Massie Dollar PT, DPT  Physical Therapist - [facility]  8:54 AM 12/25/23

## 2023-12-30 ENCOUNTER — Ambulatory Visit: Admitting: Physical Therapy

## 2023-12-30 DIAGNOSIS — R2689 Other abnormalities of gait and mobility: Secondary | ICD-10-CM

## 2023-12-30 DIAGNOSIS — R293 Abnormal posture: Secondary | ICD-10-CM

## 2023-12-30 DIAGNOSIS — M6281 Muscle weakness (generalized): Secondary | ICD-10-CM

## 2023-12-30 DIAGNOSIS — R262 Difficulty in walking, not elsewhere classified: Secondary | ICD-10-CM | POA: Diagnosis not present

## 2023-12-30 NOTE — Therapy (Signed)
 OUTPATIENT PHYSICAL THERAPY NEURO/    Patient Name: Bianca Rogers MRN: [id number] DOB:1950/04/05, 73 y.o., female Today's Date: 12/30/2023  PCP: Vincente Shivers, NP  REFERRING PROVIDER: Lane Arthea BRAVO, MD   END OF SESSION:  PT End of Session - 12/30/23 0846     Visit Number 12    Number of Visits 25    Date for Recertification  01/28/24    Authorization Type [employer] medic[employer]    Authorization Time Period 11/05/23-02/07/24 24 visits    Authorization - Number of Visits 24    Progress Note Due on Visit 20    PT Start Time 0848    PT Stop Time 0927    PT Time Calculation (min) 39 min    Equipment Utilized During Treatment Gait belt    Activity Tolerance Patient tolerated treatment well;No increased pain    Behavior During Therapy [facility] for tasks assessed/performed             Past Medical History:  Diagnosis Date   Anal fissure 12/09/2016   Added automatically from request for surgery [id number]    Added automatically from request for surgery [id number]     Arthralgia of left knee 12/03/2020   Asthma 08/29/2022   Chronic/stable.    Follows with Dr. Elayne, pulmonology.   Using Flovent  and albuterol  inhaler as needed.     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     Heart murmur    History of total knee arthroplasty 08/21/2021   Hyperlipidemia 08/29/2022   Lab Results   Component Value Date    Cholesterol 197 01/28/2022      Lab Results   Component Value Date    HDL 56 01/28/2022      Lab Results   Component Value Date    LDL Calculated 115 01/28/2022      Lab Results   Component Value Date    Triglycerides 131 01/28/2022      Lab  Results   Component Value Date    Chol/HDL Ratio 3.5 01/28/2022      Criteria used to determine recommendation include:    Hypertension    Invasive carcinoma of breast (HCC)    Liver cirrhosis secondary to NASH (HCC)    Lung nodule    Major depressive disorder in full remission 04/02/2020   Mood stable on venlafaxine  150 mg daily.     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 breast of female, estrogen receptor positive (HCC)    Migraine 08/29/2022   NAFLD (nonalcoholic fatty liver disease) 92/76/7985   Non-alcoholic cirrhosis (HCC)    Osteoarthritis of left knee 09/03/2020   Peritoneal hematoma 07/07/2019   Sprain of MCL (medial collateral ligament) of knee 09/03/2020   Symptom associated with female genital organs 07/09/2010   Past Surgical History:  Procedure Laterality Date   ABDOMINAL HYSTERECTOMY     AXILLARY SENTINEL NODE BIOPSY Left 04/22/2023   Procedure: AXILLARY SENTINEL NODE BIOPSY;  Surgeon: Lane Shope, MD;  Location: [facility] ORS;  Service: General;  Laterality: Left;   BREAST BIOPSY Left 03/31/2023   Us  Core bx, coil clip - path pending   BREAST  BIOPSY Left 03/31/2023   US  LT BREAST BX W LOC DEV 1ST LESION IMG BX SPEC US  GUIDE 03/31/2023 [facility]   BREAST BIOPSY Left 04/20/2023   US  LT RADIO FREQUENCY TAG LOC US  GUIDE 04/20/2023 [facility]   BREAST LUMPECTOMY WITH RADIOFREQUENCY TAG IDENTIFICATION Left 04/22/2023   Procedure: BREAST LUMPECTOMY WITH RADIOFREQUENCY TAG IDENTIFICATION;  Surgeon: Lane Shope, MD;  Location: [facility] OR[facility];  Service: General;  Laterality: Left;   CARDIAC CATHETERIZATION  03/10/2012   Negative   CATARACT EXTRACTION Bilateral    CESAREAN SECTION     CHOLECYSTECTOMY     RE-EXCISION OF BREAST LUMPECTOMY Left 05/20/2023   Procedure: EXCISION, LESION, BREAST, REPEAT;  Surgeon: Lane Shope, MD;  Location: [facility];  Service: General;  Laterality: Left;   SPLENECTOMY     TONSILLECTOMY     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  Rationale for Evaluation and Treatment: Rehabilitation  SUBJECTIVE:                                                                                                                                                                                             SUBJECTIVE STATEMENT:  Pt a little sore from when her mom fell and had trouble helping her up- ended up calling a neighbor to help.   Pt accompanied by: self  PERTINENT HISTORY:   Pt reports she has had PT in [place] before for her neck and after her L TKA.  Pt reports that she has been having neck pain and headaches to the point where she will be having an MRI tomorrow.  Pt reports having 2 falls (August 2nd, August 6th).  Pt notes that the first fall she was trying to get out of the recliner and she fell forward.  Pt reports on the second fall, she missed a step. Pt reports she feels  like she has POTS, and that it runs in her family as well (granddaughter and grandson).   Pt had gabapentin  increased from 1 time in the morning and 1 time at night, to 2 at each time frame, and then recently switched to 3 in the morning and  3 at night. Pt reports she was told by MD to ambulate with a walker, however she has not been doing that.  PMH: asthma, CAD, DM2, heart murmur, L TKA, HTN, Depressive disorder,migraines  PAIN:  Are you having pain? 6/10 Rt hemi headache  PRECAUTIONS: Pt has hx of breast cancer and finished radiation back in May.    WEIGHT BEARING RESTRICTIONS: No  FALLS: Has patient fallen in last 6 months? Yes. Number of falls 2  LIVING ENVIRONMENT: Lives with: mother lives with pt Lives in: Apartment Stairs: Elevator Has following equipment at home: Single point cane and Environmental consultant - 2 wheeled  PLOF: Independent  PATIENT GOALS: Being less wobbly and feel more balances.  OBJECTIVE:  Note: Objective measures were completed at Evaluation unless otherwise noted.  DIAGNOSTIC FINDINGS: Pt to have an MRI tomorrow.    COGNITION: Overall cognitive status: Within functional limits for tasks assessed   SENSATION: WFL, how pt notes burning in the bottom of her feet.  Pt reports she has neuropathy in the feet.  COORDINATION: Not formally tested   ORTHOSTATICS:  Eval Supine - BP: 131/52  mmHG  Sitting - BP: 112/55 mmHg  Standing - BP: 96/53 mmHg  Standing +3 Min - BP: 133/70 mmHg    11/18/23  117/61mmHg 78bpm  supine  102/43mmHg 80bpm seated 0 118/58mmHg 84bpm seated x1 minute 87/39mmHg 84bpm standing x0 minutes 104/23mmHg 88bpm standing x1 minute 105/41mmHg 87bpm standing x2 minute 110/81mmHg 90bpm standing post [id number]    9/12:  Seated 121/63 HR 68bpm  Standing 0 min 120/60 HR79  Standing 1 min 127/69 HR 86  Following gait for 171ft 126/56 HR82     9/17:  Seated: 115/61 HR 76 Standing: 81/49 HR 79 mild lightheadedness Standing 1 min 101/60 HR 87  reduced s/s but still slightly present  9/24: Seated 132/64 HR 67  Standing : 116/61 HR 70   Standing 1 min: 131/63 HR 72  9/30:  -Seated: 119/54 HR 66 -Standing 0 min 110/58 HR 70 mild light headedness as well as tingling in  leg -Standing 2 min: 121/65 HR 71   -Sitting:  124/52 HR 65    FUNCTIONAL TESTS:  5 times sit to stand: 19.04 sec Timed up and go (TUG): 14.68 sec 6 minute walk test:  9/10: tolerates on 474ft over 44m26sec before legs before increasingly ataxic  9/12: gait training with use of 4WW; tolerated 613ft, for 4:32, no significant ataxia noted, and only mild pain in sacrum area.  10 meter walk test: 15.32 sec; 0.65 m/s Dynamic Gait Index   PATIENT SURVEYS:  ABC Scale:  64% (9/10)  TREATMENT DATE: 12/30/23  No reports of dizziness on this day.  TA- To improve functional movements patterns for everyday tasks    Nustep UE and LE reciprocal movement training rolling hills setting L3-7 x 8 min  NMR: To facilitate reeducation of movement, balance, posture, coordination, and/or proprioception/kinesthetic sense.  VOR:  Ball raise to eye level x 128ft.  Standing ball toss to eye level x 45 sec.  Ball raise to eye level x 145ft.  Standing ball toss to eye level x 45 sec.  Forward and reverse gait with ball raise and lowering 2 x 40 ft ea   Sidestepping on airex beam x 6 laps   Airex to airex beam to airex then turn on airex pad and repeat x 10 rounds     Throughout PT session, PT provided CGA for safety intermittently with gait and dynamic balance/gait to improve safety and reduced fall risk.   PATIENT EDUCATION: Education details: Pt educated on role of PT and services provided during current POC, along with prognosis and information about the clinic.  Person educated: Patient Education method: Explanation Education comprehension: verbalized understanding  HOME EXERCISE PROGRAM: Access Code: [email or web address] URL: [email or web address] Date: 12/08/2023 Prepared by: Massie Dollar  Exercises - Backward Walking with Counter Support  - 1 x daily - 5 x  weekly - 3 sets - 10 reps - Seated Gaze Stabilization with Head Rotation  - 1 x daily - 5 x weekly - 3 sets - 10 reps - Seated Gaze Stabilization with Head Nod  - 1 x daily - 5 x weekly - 3 sets - 10 reps - Sit to Stand with Arms Crossed  - 1 x daily - 5 x weekly - 3 sets - 10 reps - Side Stepping with Counter Support  - 1 x daily - 5 x weekly - 3 sets - 10 reps  GOALS: Goals reviewed with patient? Yes  SHORT TERM GOALS: Target date: 12/03/2023   Pt will be independent with HEP in order to demonstrate increased ability to perform tasks related to occupation/hobbies. Baseline: to be given at subsequent visit Goal status: INITIAL   LONG TERM GOALS: Target date: 01/28/2024  1.  Patient (> 43 years old) will complete five times sit to stand test in < 15 seconds indicating an increased LE strength and improved balance. Baseline: 19.04 sec 10/8: 12.89 sec  Goal status: MET  2.  Pt will improve ABC by at least 13% in order to demonstrate clinically significant improvement in balance confidence.  Baseline: 64% (9/10)   10/8: 71.25% Goal status: in progress   3.  Pt will improve FGA by at least 3 points in order to demonstrate clinically significant improvement in balance and decreased risk for falls. Baseline: 12/16/23: 19 Goal status: adjusted/INITIAL   4.  Patient will reduce timed up and go to <11 seconds to reduce fall risk and demonstrate improved transfer/gait ability. Baseline: 14.68 sec 10/8: 9.59 sec without AD. Goal status: MET  5.  Patient will increase 10 meter walk test to >1.54m/s as to improve gait speed for better community ambulation and to reduce fall risk. Baseline: 15.32 sec; 0.65 m/s 10/8: normal speed: 0.93 m/s. Average Fast speed: 1.1 m/s Goal status: In progress  6.  Patient will increase six minute walk test distance to >1000 for progression to community ambulator and improve gait ability Baseline: 9/10: tolerates on 433ft over 53m26sec before legs before  increasingly ataxic  10/8: 924ft without UE support  Goal status: In progress  ASSESSMENT:  CLINICAL IMPRESSION:  Patient arrived with good motivation for completion of pt activities.  Continued with current plan of care as laid out in evaluation and recent prior sessions. Pt remains motivated to advance progress toward goals in order to maximize independence and safety at home. Pt requires high level assistance and cuing for completion of exercises in order to provide adequate level of stimulation and perturbation. Author allows pt as much opportunity as possible to perform independent righting strategies, only stepping in when pt is unable to prevent falling to floor. Pt closely monitored throughout session for safe vitals response and to maximize patient safety during interventions. Pt showed very good LLE SLS at end of session maintaining for 30 sec continuously but has higher difficulties on other side. Pt will continue to benefit from skilled PT to address balance and gait deficits to improve overall safety with mobility and improve overall QoL.     OBJECTIVE IMPAIRMENTS: Abnormal gait, decreased balance, decreased mobility, difficulty walking, dizziness, and improper body mechanics.   ACTIVITY LIMITATIONS: lifting, sitting, standing, stairs, transfers, reach over head, and locomotion level  PARTICIPATION LIMITATIONS: meal prep, cleaning, laundry, driving, shopping, community activity, and yard work  PERSONAL FACTORS: Age, Time since onset of injury/illness/exacerbation, and 3+ comorbidities: asthma, CAD, DM2, heart murmur, L TKA, HTN, Depressive disorder,migraines are also affecting patient's functional outcome.   REHAB POTENTIAL: Good  CLINICAL DECISION MAKING: Evolving/moderate complexity  EVALUATION COMPLEXITY: Moderate  PLAN:  PT FREQUENCY: 2x/week  PT DURATION: 12 weeks  PLANNED INTERVENTIONS: 97750- Physical Performance Testing, 97110-Therapeutic exercises, 97530-  Therapeutic activity, [id number]- Neuromuscular re-education, 97535- Self Care, [id number]- Manual therapy, [id number]- Gait training, [phone]- Canalith repositioning, Balance training, and Stair training  PLAN FOR NEXT SESSION:   Continue Vestibular and balance habituation interventions.   Activity tolerance  training.   Note: Portions of this document were prepared using Dragon voice recognition software and although reviewed may contain unintentional dictation errors in syntax, grammar, or spelling.  Lonni KATHEE Gainer PT ,DPT Physical Therapist- [place]  [facility]   8:46 AM 12/30/23

## 2024-01-01 ENCOUNTER — Ambulatory Visit: Admitting: Physical Therapy

## 2024-01-01 DIAGNOSIS — R262 Difficulty in walking, not elsewhere classified: Secondary | ICD-10-CM

## 2024-01-01 DIAGNOSIS — R2689 Other abnormalities of gait and mobility: Secondary | ICD-10-CM

## 2024-01-01 DIAGNOSIS — R293 Abnormal posture: Secondary | ICD-10-CM

## 2024-01-01 DIAGNOSIS — M6281 Muscle weakness (generalized): Secondary | ICD-10-CM

## 2024-01-01 NOTE — Therapy (Signed)
 OUTPATIENT PHYSICAL THERAPY NEURO/    Patient Name: Bianca Rogers MRN: [id number] DOB:07-10-1950, 73 y.o., female Today's Date: 01/01/2024  PCP: Vincente Shivers, NP  REFERRING PROVIDER: Lane Arthea BRAVO, MD   END OF SESSION:  PT End of Session - 01/01/24 0924     Visit Number 13    Number of Visits 25    Date for Recertification  01/28/24    Authorization Type [employer] medic[employer]    Authorization Time Period 11/05/23-02/07/24 24 visits    Authorization - Number of Visits 24    Progress Note Due on Visit 20    PT Start Time 0850    PT Stop Time 0930    PT Time Calculation (min) 40 min    Equipment Utilized During Treatment Gait belt    Activity Tolerance Patient tolerated treatment well;No increased pain    Behavior During Therapy [facility] for tasks assessed/performed             Past Medical History:  Diagnosis Date   Anal fissure 12/09/2016   Added automatically from request for surgery [id number]    Added automatically from request for surgery [id number]     Arthralgia of left knee 12/03/2020   Asthma 08/29/2022   Chronic/stable.    Follows with Dr. Elayne, pulmonology.   Using Flovent  and albuterol  inhaler as needed.     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     Heart murmur    History of total knee arthroplasty 08/21/2021   Hyperlipidemia 08/29/2022   Lab Results   Component Value Date    Cholesterol 197 01/28/2022      Lab Results   Component Value Date    HDL 56 01/28/2022      Lab Results   Component Value Date    LDL Calculated 115 01/28/2022      Lab Results   Component Value Date    Triglycerides 131 01/28/2022      Lab  Results   Component Value Date    Chol/HDL Ratio 3.5 01/28/2022      Criteria used to determine recommendation include:    Hypertension    Invasive carcinoma of breast (HCC)    Liver cirrhosis secondary to NASH (HCC)    Lung nodule    Major depressive disorder in full remission 04/02/2020   Mood stable on venlafaxine  150 mg daily.     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 breast of female, estrogen receptor positive (HCC)    Migraine 08/29/2022   NAFLD (nonalcoholic fatty liver disease) 92/76/7985   Non-alcoholic cirrhosis (HCC)    Osteoarthritis of left knee 09/03/2020   Peritoneal hematoma 07/07/2019   Sprain of MCL (medial collateral ligament) of knee 09/03/2020   Symptom associated with female genital organs 07/09/2010   Past Surgical History:  Procedure Laterality Date   ABDOMINAL HYSTERECTOMY     AXILLARY SENTINEL NODE BIOPSY Left 04/22/2023   Procedure: AXILLARY SENTINEL NODE BIOPSY;  Surgeon: Lane Shope, MD;  Location: [facility];  Service: General;  Laterality: Left;   BREAST BIOPSY Left 03/31/2023   Us  Core bx, coil clip - path pending   BREAST  BIOPSY Left 03/31/2023   US  LT BREAST BX W LOC DEV 1ST LESION IMG BX SPEC US  GUIDE 03/31/2023 [facility]   BREAST BIOPSY Left 04/20/2023   US  LT RADIO FREQUENCY TAG LOC US  GUIDE 04/20/2023 [facility]   BREAST LUMPECTOMY WITH RADIOFREQUENCY TAG IDENTIFICATION Left 04/22/2023   Procedure: BREAST LUMPECTOMY WITH RADIOFREQUENCY TAG IDENTIFICATION;  Surgeon: Lane Shope, MD;  Location: [facility];  Service: General;  Laterality: Left;   CARDIAC CATHETERIZATION  03/10/2012   Negative   CATARACT EXTRACTION Bilateral    CESAREAN SECTION     CHOLECYSTECTOMY     RE-EXCISION OF BREAST LUMPECTOMY Left 05/20/2023   Procedure: EXCISION, LESION, BREAST, REPEAT;  Surgeon: Lane Shope, MD;  Location: [facility];  Service: General;  Laterality: Left;   SPLENECTOMY     TONSILLECTOMY     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 breast of female, estrogen receptor positive (HCC) 04/09/2023   Invasive carcinoma of breast (HCC) 04/08/2023   Family history of breast cancer 04/08/2023   Gastroesophageal reflux disease 03/02/2023   Liver cirrhosis secondary to NASH (HCC) 03/02/2023   Bronchiectasis (HCC) 06/09/2013   Type 2 diabetes mellitus with diabetic neuropathy (HCC) 06/09/2013   HTN (hypertension), benign 06/09/2013    ONSET DATE: 10/09/23  REFERRING DIAG: R29.6 (ICD-10-CM) - Frequent falls   THERAPY DIAG:  Difficulty in walking, not elsewhere classified  Abnormal posture  Muscle weakness (generalized)  Imbalance  Rationale for Evaluation and Treatment: Rehabilitation  SUBJECTIVE:                                                                                                                                                                                             SUBJECTIVE STATEMENT:  Pt arrives without SPC. No dizziness reported by pt. No other updates.   Pt accompanied by: self  PERTINENT HISTORY:   Pt reports she has had PT in [place] before for her neck and after her L TKA.  Pt reports that she has been having neck pain and headaches to the point where she will be having an MRI tomorrow.  Pt reports having 2 falls (August 2nd, August 6th).  Pt notes that the first fall she was trying to get out of the recliner and she fell forward.  Pt reports on the second fall, she missed a step. Pt reports  she feels like she has POTS, and that it runs in her family as well (granddaughter and grandson).   Pt had gabapentin  increased from 1 time in the morning and 1 time at night, to 2 at each time  frame, and then recently switched to 3 in the morning and 3 at night. Pt reports she was told by MD to ambulate with a walker, however she has not been doing that.  PMH: asthma, CAD, DM2, heart murmur, L TKA, HTN, Depressive disorder,migraines  PAIN:  Are you having pain? 6/10 Rt hemi headache  PRECAUTIONS: Pt has hx of breast cancer and finished radiation back in May.    WEIGHT BEARING RESTRICTIONS: No  FALLS: Has patient fallen in last 6 months? Yes. Number of falls 2  LIVING ENVIRONMENT: Lives with: mother lives with pt Lives in: Apartment Stairs: Elevator Has following equipment at home: Single point cane and Environmental consultant - 2 wheeled  PLOF: Independent  PATIENT GOALS: Being less wobbly and feel more balances.  OBJECTIVE:  Note: Objective measures were completed at Evaluation unless otherwise noted.  DIAGNOSTIC FINDINGS: Pt to have an MRI tomorrow.    COGNITION: Overall cognitive status: Within functional limits for tasks assessed   SENSATION: WFL, how pt notes burning in the bottom of her feet.  Pt reports she has neuropathy in the feet.  COORDINATION: Not formally tested   ORTHOSTATICS:  Eval Supine - BP: 131/52  mmHG  Sitting - BP: 112/55 mmHg  Standing - BP: 96/53 mmHg  Standing +3 Min - BP: 133/70 mmHg    11/18/23  117/65mmHg 78bpm  supine  102/19mmHg 80bpm seated 0 118/2mmHg 84bpm seated x1 minute 87/59mmHg 84bpm standing x0 minutes 104/1mmHg 88bpm standing x1 minute 105/29mmHg 87bpm standing x2 minute 110/12mmHg 90bpm standing post [id number]    9/12:  Seated 121/63 HR 68bpm  Standing 0 min 120/60 HR79  Standing 1 min 127/69 HR 86  Following gait for 13ft 126/56 HR82     9/17:  Seated: 115/61 HR 76 Standing: 81/49 HR 79 mild lightheadedness Standing 1 min 101/60 HR 87  reduced s/s but still slightly present  9/24: Seated 132/64 HR 67  Standing : 116/61 HR 70   Standing 1 min: 131/63 HR 72  9/30:  -Seated: 119/54 HR 66 -Standing 0 min 110/58  HR 70 mild light headedness as well as tingling in leg -Standing 2 min: 121/65 HR 71   -Sitting:  124/52 HR 65    FUNCTIONAL TESTS:  5 times sit to stand: 19.04 sec Timed up and go (TUG): 14.68 sec 6 minute walk test:  9/10: tolerates on 443ft over 34m26sec before legs before increasingly ataxic  9/12: gait training with use of 4WW; tolerated 662ft, for 4:32, no significant ataxia noted, and only mild pain in sacrum area.  10 meter walk test: 15.32 sec; 0.65 m/s Dynamic Gait Index   PATIENT SURVEYS:  ABC Scale:  64% (9/10)  TREATMENT DATE: 01/01/24  No reports of dizziness on this day.  TA- To improve functional movements patterns for everyday tasks   Nustep UE and LE reciprocal movement training rolling hills setting L3-8 x 10 min  NMR: To facilitate reeducation of movement, balance, posture, coordination, and/or proprioception/kinesthetic sense.  Korebalance  Static hold with UE support 2 x 30 sce Static hold without UE support 2 x 30 sec  AP weight shift with UE support x 5 each and x 10 without UE support  Lateral weight shift x 5 with UE support and x 10 without UE support   Tux racer  X 2 performed with UE support, 1:12 19 fish and 1:13 and 21 fish  X 1 without UE support 1:30 12 fish and 1:28 and 14 fish   Sit<>stand with ball toss to PT x 12 from level ground and x 12 from airex pad. Increased distance ball toss from PT away from BOS with increased repetitions. One mild anterior LOB from airex pad, requiring stepping stratey to correct balance.   Throughout PT session, PT provided CGA for safety intermittently with gait and dynamic balance/gait to improve safety and reduced fall risk.   PATIENT EDUCATION: Education details: Pt educated on role of PT and services provided during current POC, along with prognosis and information about the  clinic.  Person educated: Patient Education method: Explanation Education comprehension: verbalized understanding  HOME EXERCISE PROGRAM: Access Code: [email or web address] URL: [email or web address] Date: 12/08/2023 Prepared by: Massie Dollar  Exercises - Backward Walking with Counter Support  - 1 x daily - 5 x weekly - 3 sets - 10 reps - Seated Gaze Stabilization with Head Rotation  - 1 x daily - 5 x weekly - 3 sets - 10 reps - Seated Gaze Stabilization with Head Nod  - 1 x daily - 5 x weekly - 3 sets - 10 reps - Sit to Stand with Arms Crossed  - 1 x daily - 5 x weekly - 3 sets - 10 reps - Side Stepping with Counter Support  - 1 x daily - 5 x weekly - 3 sets - 10 reps  GOALS: Goals reviewed with patient? Yes  SHORT TERM GOALS: Target date: 12/03/2023   Pt will be independent with HEP in order to demonstrate increased ability to perform tasks related to occupation/hobbies. Baseline: to be given at subsequent visit Goal status: INITIAL   LONG TERM GOALS: Target date: 01/28/2024  1.  Patient (> 49 years old) will complete five times sit to stand test in < 15 seconds indicating an increased LE strength and improved balance. Baseline: 19.04 sec 10/8: 12.89 sec  Goal status: MET  2.  Pt will improve ABC by at least 13% in order to demonstrate clinically significant improvement in balance confidence.  Baseline: 64% (9/10)   10/8: 71.25% Goal status: in progress   3.  Pt will improve FGA by at least 3 points in order to demonstrate clinically significant improvement in balance and decreased risk for falls. Baseline: 12/16/23: 19 Goal status: adjusted/INITIAL   4.  Patient will reduce timed up and go to <11 seconds to reduce fall risk and demonstrate improved transfer/gait ability. Baseline: 14.68 sec 10/8: 9.59 sec without AD. Goal status: MET  5.  Patient will increase 10 meter walk test to >1.52m/s as to improve gait speed for better community ambulation and to reduce fall  risk. Baseline: 15.32 sec; 0.65 m/s 10/8: normal speed: 0.93 m/s. Average Fast speed: 1.1 m/s Goal status: In  progress  6.  Patient will increase six minute walk test distance to >1000 for progression to community ambulator and improve gait ability Baseline: 9/10: tolerates on 411ft over 67m26sec before legs before increasingly ataxic  10/8: 951ft without UE support  Goal status: In progress   ASSESSMENT:  CLINICAL IMPRESSION:  Patient arrived with good motivation for completion of pt activities.  Continued with current plan of care as laid out in evaluation and recent prior sessions. Pt remains motivated to advance progress toward goals in order to maximize independence and safety at home. Pt requires high level assistance and cuing for completion of exercises in order to provide adequate level of stimulation and perturbation. Intermittent CGA for safety throughout balance training on kore balance and airex pad. Improved use of ankle strategy throughout for COM control. Pt will continue to benefit from skilled PT to address balance and gait deficits to improve overall safety with mobility and improve overall QoL.     OBJECTIVE IMPAIRMENTS: Abnormal gait, decreased balance, decreased mobility, difficulty walking, dizziness, and improper body mechanics.   ACTIVITY LIMITATIONS: lifting, sitting, standing, stairs, transfers, reach over head, and locomotion level  PARTICIPATION LIMITATIONS: meal prep, cleaning, laundry, driving, shopping, community activity, and yard work  PERSONAL FACTORS: Age, Time since onset of injury/illness/exacerbation, and 3+ comorbidities: asthma, CAD, DM2, heart murmur, L TKA, HTN, Depressive disorder,migraines are also affecting patient's functional outcome.   REHAB POTENTIAL: Good  CLINICAL DECISION MAKING: Evolving/moderate complexity  EVALUATION COMPLEXITY: Moderate  PLAN:  PT FREQUENCY: 2x/week  PT DURATION: 12 weeks  PLANNED INTERVENTIONS: [id number]-  Physical Performance Testing, 97110-Therapeutic exercises, 97530- Therapeutic activity, [id number]- Neuromuscular re-education, 97535- Self Care, [id number]- Manual therapy, [id number]- Gait training, [phone]- Canalith repositioning, Balance training, and Stair training  PLAN FOR NEXT SESSION:   Continue Vestibular and balance habituation interventions.   Activity tolerance  training.    Massie FORBES Dollar PT ,DPT Physical Therapist- [place]  [facility]   9:25 AM 01/01/24

## 2024-01-06 ENCOUNTER — Ambulatory Visit: Attending: Cardiovascular Disease

## 2024-01-06 ENCOUNTER — Ambulatory Visit: Admitting: Physical Therapy

## 2024-01-06 DIAGNOSIS — R42 Dizziness and giddiness: Secondary | ICD-10-CM

## 2024-01-06 DIAGNOSIS — M6281 Muscle weakness (generalized): Secondary | ICD-10-CM

## 2024-01-06 DIAGNOSIS — R293 Abnormal posture: Secondary | ICD-10-CM

## 2024-01-06 DIAGNOSIS — R2689 Other abnormalities of gait and mobility: Secondary | ICD-10-CM

## 2024-01-06 DIAGNOSIS — R262 Difficulty in walking, not elsewhere classified: Secondary | ICD-10-CM

## 2024-01-06 DIAGNOSIS — I351 Nonrheumatic aortic (valve) insufficiency: Secondary | ICD-10-CM | POA: Diagnosis not present

## 2024-01-06 LAB — ECHOCARDIOGRAM COMPLETE
AR max vel: 2.03 cm2
AV Area VTI: 1.96 cm2
AV Area mean vel: 2.04 cm2
AV Mean grad: 9.3 mmHg
AV Peak grad: 17.3 mmHg
Ao pk vel: 2.08 m/s
Area-P 1/2: 3.21 cm2
S' Lateral: 2.6 cm

## 2024-01-06 NOTE — Therapy (Addendum)
 OUTPATIENT PHYSICAL THERAPY NEURO/    Patient Name: Bianca Rogers MRN: [id number] DOB:June 06, 1950, 73 y.o., female Today's Date: 01/06/2024  PCP: Vincente Shivers, NP  REFERRING PROVIDER: Lane Arthea BRAVO, MD   END OF SESSION:  PT End of Session - 01/06/24 0857     Visit Number 14    Number of Visits 25    Date for Recertification  01/28/24    Authorization Type [employer] medic[employer]    Authorization Time Period 11/05/23-02/07/24 24 visits    Authorization - Number of Visits 24    Progress Note Due on Visit 20    PT Start Time 0855    PT Stop Time 0933    PT Time Calculation (min) 38 min    Equipment Utilized During Treatment Gait belt    Activity Tolerance Patient tolerated treatment well;No increased pain    Behavior During Therapy [facility] for tasks assessed/performed             Past Medical History:  Diagnosis Date   Anal fissure 12/09/2016   Added automatically from request for surgery [id number]    Added automatically from request for surgery [id number]     Arthralgia of left knee 12/03/2020   Asthma 08/29/2022   Chronic/stable.    Follows with Dr. Elayne, pulmonology.   Using Flovent  and albuterol  inhaler as needed.     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     Heart murmur    History of total knee arthroplasty 08/21/2021   Hyperlipidemia 08/29/2022   Lab Results   Component Value Date    Cholesterol 197 01/28/2022      Lab Results   Component Value Date    HDL 56 01/28/2022      Lab Results   Component Value Date    LDL Calculated 115 01/28/2022      Lab Results   Component Value Date    Triglycerides 131 01/28/2022      Lab  Results   Component Value Date    Chol/HDL Ratio 3.5 01/28/2022      Criteria used to determine recommendation include:    Hypertension    Invasive carcinoma of breast (HCC)    Liver cirrhosis secondary to NASH (HCC)    Lung nodule    Major depressive disorder in full remission 04/02/2020   Mood stable on venlafaxine  150 mg daily.     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 breast of female, estrogen receptor positive (HCC)    Migraine 08/29/2022   NAFLD (nonalcoholic fatty liver disease) 92/76/7985   Non-alcoholic cirrhosis (HCC)    Osteoarthritis of left knee 09/03/2020   Peritoneal hematoma 07/07/2019   Sprain of MCL (medial collateral ligament) of knee 09/03/2020   Symptom associated with female genital organs 07/09/2010   Past Surgical History:  Procedure Laterality Date   ABDOMINAL HYSTERECTOMY     AXILLARY SENTINEL NODE BIOPSY Left 04/22/2023   Procedure: AXILLARY SENTINEL NODE BIOPSY;  Surgeon: Lane Shope, MD;  Location: [facility] ORS;  Service: General;  Laterality: Left;   BREAST BIOPSY Left 03/31/2023   Us  Core bx, coil clip - path pending   BREAST  BIOPSY Left 03/31/2023   US  LT BREAST BX W LOC DEV 1ST LESION IMG BX SPEC US  GUIDE 03/31/2023 [facility]   BREAST BIOPSY Left 04/20/2023   US  LT RADIO FREQUENCY TAG LOC US  GUIDE 04/20/2023 [facility]   BREAST LUMPECTOMY WITH RADIOFREQUENCY TAG IDENTIFICATION Left 04/22/2023   Procedure: BREAST LUMPECTOMY WITH RADIOFREQUENCY TAG IDENTIFICATION;  Surgeon: Lane Shope, MD;  Location: [facility] ORS;  Service: General;  Laterality: Left;   CARDIAC CATHETERIZATION  03/10/2012   Negative   CATARACT EXTRACTION Bilateral    CESAREAN SECTION     CHOLECYSTECTOMY     RE-EXCISION OF BREAST LUMPECTOMY Left 05/20/2023   Procedure: EXCISION, LESION, BREAST, REPEAT;  Surgeon: Lane Shope, MD;  Location: [facility] ORS;  Service: General;  Laterality: Left;   SPLENECTOMY     TONSILLECTOMY     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 breast of female, estrogen receptor positive (HCC) 04/09/2023   Invasive carcinoma of breast (HCC) 04/08/2023   Family history of breast cancer 04/08/2023   Gastroesophageal reflux disease 03/02/2023   Liver cirrhosis secondary to NASH (HCC) 03/02/2023   Bronchiectasis (HCC) 06/09/2013   Type 2 diabetes mellitus with diabetic neuropathy (HCC) 06/09/2013   HTN (hypertension), benign 06/09/2013    ONSET DATE: 10/09/23  REFERRING DIAG: R29.6 (ICD-10-CM) - Frequent falls   THERAPY DIAG:  Difficulty in walking, not elsewhere classified  Abnormal posture  Muscle weakness (generalized)  Imbalance  Rationale for Evaluation and Treatment: Rehabilitation  SUBJECTIVE:                                                                                                                                                                                             SUBJECTIVE STATEMENT:  Pt arrives without SPC. No dizziness reported by pt.arrives a few minutes late due to helping mother with breakfast.    Pt accompanied by: self  PERTINENT HISTORY:   Pt reports she has had PT in [place] before for her neck and after her L TKA.  Pt reports that she has been having neck pain and headaches to the point where she will be having an MRI tomorrow.  Pt reports having 2 falls (August 2nd, August 6th).  Pt notes that the first fall she was trying to get out of the recliner and she fell forward.  Pt reports on the  second fall, she missed a step. Pt reports she feels like she has POTS, and that it runs in her family as well (granddaughter and grandson).   Pt had gabapentin  increased from 1 time in the  morning and 1 time at night, to 2 at each time frame, and then recently switched to 3 in the morning and 3 at night. Pt reports she was told by MD to ambulate with a walker, however she has not been doing that.  PMH: asthma, CAD, DM2, heart murmur, L TKA, HTN, Depressive disorder,migraines  PAIN:  Are you having pain? 6/10 Rt hemi headache  PRECAUTIONS: Pt has hx of breast cancer and finished radiation back in May.    WEIGHT BEARING RESTRICTIONS: No  FALLS: Has patient fallen in last 6 months? Yes. Number of falls 2  LIVING ENVIRONMENT: Lives with: mother lives with pt Lives in: Apartment Stairs: Elevator Has following equipment at home: Single point cane and Environmental Consultant - 2 wheeled  PLOF: Independent  PATIENT GOALS: Being less wobbly and feel more balances.  OBJECTIVE:  Note: Objective measures were completed at Evaluation unless otherwise noted.  DIAGNOSTIC FINDINGS: Pt to have an MRI tomorrow.    COGNITION: Overall cognitive status: Within functional limits for tasks assessed   SENSATION: WFL, how pt notes burning in the bottom of her feet.  Pt reports she has neuropathy in the feet.  COORDINATION: Not formally tested   ORTHOSTATICS:  Eval Supine - BP: 131/52  mmHG  Sitting - BP: 112/55 mmHg  Standing - BP: 96/53 mmHg  Standing +3 Min - BP: 133/70 mmHg    11/18/23  117/40mmHg 78bpm  supine  102/63mmHg 80bpm seated 0 118/105mmHg 84bpm seated x1 minute 87/23mmHg 84bpm standing x0 minutes 104/61mmHg 88bpm standing x1 minute 105/52mmHg 87bpm standing x2 minute 110/63mmHg 90bpm standing post [id number]    9/12:  Seated 121/63 HR 68bpm  Standing 0 min 120/60 HR79  Standing 1 min 127/69 HR 86  Following gait for 165ft 126/56 HR82     9/17:  Seated: 115/61 HR 76 Standing: 81/49 HR 79 mild lightheadedness Standing 1 min 101/60 HR 87  reduced s/s but still slightly present  9/24: Seated 132/64 HR 67  Standing : 116/61 HR 70   Standing 1 min: 131/63 HR 72  9/30:   -Seated: 119/54 HR 66 -Standing 0 min 110/58 HR 70 mild light headedness as well as tingling in leg -Standing 2 min: 121/65 HR 71   -Sitting:  124/52 HR 65    FUNCTIONAL TESTS:  5 times sit to stand: 19.04 sec Timed up and go (TUG): 14.68 sec 6 minute walk test:  9/10: tolerates on 474ft over 82m26sec before legs before increasingly ataxic  9/12: gait training with use of 4WW; tolerated 629ft, for 4:32, no significant ataxia noted, and only mild pain in sacrum area.  10 meter walk test: 15.32 sec; 0.65 m/s Dynamic Gait Index   PATIENT SURVEYS:  ABC Scale:  64% (9/10)  TREATMENT DATE: 01/06/24  No reports of dizziness on this day.  TA- To improve functional movements patterns for everyday tasks   Weighted gait training with 3# AW x 231ft.  Weighted gait with head turns x 165ft  with 3 # AW Weighted gait with hed nogs x 159ft with 3 AW  Weight gait with VOR 149ft for each.  Greatest difficulty with head turns and hrz VOR. Requiring min assist from PT for safety and stepping strategy to prevent LOB.   NMR:  Standing on airex :  Normal BOS x 30 sec  1 foot on 6 inch step 2 x 30 sce bil  Reciprocal foot taps on  6 inch step x 15 bil  Horz shoulder abduction with alternating 3# DB from R to L UE on each rep. X 10 bil   From level surface Diagonal raises x 20 bil with slight squat in low position.  Then completed diagonal raises from airex x 20 bil  CGA for safety, but no LOB.   Forward reverse gait with no AD 65ft x 5 then forward/reverse over 4 canes for 47ft with CGA-min assist due to mild lateral LOB intermittently to the L.   Throughout PT session, PT provided CGA for safety intermittently with gait and dynamic balance/gait to improve safety and reduced fall risk.   PATIENT EDUCATION: Education details: Pt educated on role of PT and services  provided during current POC, along with prognosis and information about the clinic.   Person educated: Patient Education method: Explanation Education comprehension: verbalized understanding  HOME EXERCISE PROGRAM: Access Code: [email or web address] URL: [email or web address] Date: 12/08/2023 Prepared by: Massie Dollar  Exercises - Backward Walking with Counter Support  - 1 x daily - 5 x weekly - 3 sets - 10 reps - Seated Gaze Stabilization with Head Rotation  - 1 x daily - 5 x weekly - 3 sets - 10 reps - Seated Gaze Stabilization with Head Nod  - 1 x daily - 5 x weekly - 3 sets - 10 reps - Sit to Stand with Arms Crossed  - 1 x daily - 5 x weekly - 3 sets - 10 reps - Side Stepping with Counter Support  - 1 x daily - 5 x weekly - 3 sets - 10 reps  GOALS: Goals reviewed with patient? Yes  SHORT TERM GOALS: Target date: 12/03/2023   Pt will be independent with HEP in order to demonstrate increased ability to perform tasks related to occupation/hobbies. Baseline: to be given at subsequent visit Goal status: INITIAL   LONG TERM GOALS: Target date: 01/28/2024  1.  Patient (> 20 years old) will complete five times sit to stand test in < 15 seconds indicating an increased LE strength and improved balance. Baseline: 19.04 sec 10/8: 12.89 sec  Goal status: MET  2.  Pt will improve ABC by at least 13% in order to demonstrate clinically significant improvement in balance confidence.  Baseline: 64% (9/10)   10/8: 71.25% Goal status: in progress   3.  Pt will improve FGA by at least 3 points in order to demonstrate clinically significant improvement in balance and decreased risk for falls. Baseline: 12/16/23: 19 Goal status: adjusted/INITIAL   4.  Patient will reduce timed up and go to <11 seconds to reduce fall risk and demonstrate improved transfer/gait ability. Baseline: 14.68 sec 10/8: 9.59 sec without AD. Goal status: MET  5.  Patient will increase 10 meter walk test to  >1.65m/s as to improve gait speed for  better community ambulation and to reduce fall risk. Baseline: 15.32 sec; 0.65 m/s 10/8: normal speed: 0.93 m/s. Average Fast speed: 1.1 m/s Goal status: In progress  6.  Patient will increase six minute walk test distance to >1000 for progression to community ambulator and improve gait ability Baseline: 9/10: tolerates on 468ft over 58m26sec before legs before increasingly ataxic  10/8: 92ft without UE support  Goal status: In progress   ASSESSMENT:  CLINICAL IMPRESSION:  Patient arrived with good motivation for completion of pt activities.  Continued with current plan of care as laid out in evaluation and recent prior sessions. Pt remains motivated to advance progress toward goals in order to maximize independence and safety at home. Focused on dynamic balance with visual and vestibular in-put with gait. Able to complete all interventions with only mild LOBand pt was able to self correct without UE support except for one instance with reverse gait over obstacles.  Improved use of ankle strategy throughout for COM control. Pt will continue to benefit from skilled PT to address balance and gait deficits to improve overall safety with mobility and improve overall QoL.     OBJECTIVE IMPAIRMENTS: Abnormal gait, decreased balance, decreased mobility, difficulty walking, dizziness, and improper body mechanics.   ACTIVITY LIMITATIONS: lifting, sitting, standing, stairs, transfers, reach over head, and locomotion level  PARTICIPATION LIMITATIONS: meal prep, cleaning, laundry, driving, shopping, community activity, and yard work  PERSONAL FACTORS: Age, Time since onset of injury/illness/exacerbation, and 3+ comorbidities: asthma, CAD, DM2, heart murmur, L TKA, HTN, Depressive disorder,migraines are also affecting patient's functional outcome.   REHAB POTENTIAL: Good  CLINICAL DECISION MAKING: Evolving/moderate complexity  EVALUATION COMPLEXITY:  Moderate  PLAN:  PT FREQUENCY: 2x/week  PT DURATION: 12 weeks  PLANNED INTERVENTIONS: 97750- Physical Performance Testing, 97110-Therapeutic exercises, 97530- Therapeutic activity, [id number]- Neuromuscular re-education, 97535- Self Care, [id number]- Manual therapy, [id number]- Gait training, [phone]- Canalith repositioning, Balance training, and Stair training  PLAN FOR NEXT SESSION:   Continue Vestibular and balance habituation interventions.   Activity tolerance  training.    Massie FORBES Dollar PT ,DPT Physical Therapist- [place]  [facility]   8:58 AM 01/06/24

## 2024-01-08 ENCOUNTER — Ambulatory Visit

## 2024-01-08 DIAGNOSIS — R293 Abnormal posture: Secondary | ICD-10-CM

## 2024-01-08 DIAGNOSIS — R262 Difficulty in walking, not elsewhere classified: Secondary | ICD-10-CM | POA: Diagnosis not present

## 2024-01-08 DIAGNOSIS — M6281 Muscle weakness (generalized): Secondary | ICD-10-CM

## 2024-01-08 DIAGNOSIS — R2689 Other abnormalities of gait and mobility: Secondary | ICD-10-CM

## 2024-01-08 NOTE — Therapy (Signed)
 OUTPATIENT PHYSICAL THERAPY NEURO  Patient Name: Bianca Rogers MRN: [id number] DOB:04/16/1950, 73 y.o., female Today's Date: 01/08/2024  PCP: Vincente Shivers, NP  REFERRING PROVIDER: Lane Arthea BRAVO, MD  END OF SESSION:  PT End of Session - 01/08/24 0851     Visit Number 17    Number of Visits 25    Date for Recertification  01/28/24    Authorization Type [employer] medic[employer]    Authorization Time Period 11/05/23-02/07/24 24 visits    Progress Note Due on Visit 20    PT Start Time 0847    PT Stop Time 0927    PT Time Calculation (min) 40 min    Equipment Utilized During Treatment Gait belt    Activity Tolerance Patient tolerated treatment well;No increased pain    Behavior During Therapy [facility] for tasks assessed/performed             Past Medical History:  Diagnosis Date   Anal fissure 12/09/2016   Added automatically from request for surgery [id number]    Added automatically from request for surgery [id number]     Arthralgia of left knee 12/03/2020   Asthma 08/29/2022   Chronic/stable.    Follows with Dr. Elayne, pulmonology.   Using Flovent  and albuterol  inhaler as needed.     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     Heart murmur    History of total knee arthroplasty 08/21/2021   Hyperlipidemia 08/29/2022   Lab Results   Component Value Date    Cholesterol 197 01/28/2022      Lab Results   Component Value Date    HDL 56 01/28/2022      Lab Results   Component Value Date    LDL Calculated 115 01/28/2022      Lab Results   Component Value Date    Triglycerides 131 01/28/2022      Lab Results   Component Value Date    Chol/HDL Ratio  3.5 01/28/2022      Criteria used to determine recommendation include:    Hypertension    Invasive carcinoma of breast (HCC)    Liver cirrhosis secondary to NASH (HCC)    Lung nodule    Major depressive disorder in full remission 04/02/2020   Mood stable on venlafaxine  150 mg daily.     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 breast of female, estrogen receptor positive (HCC)    Migraine 08/29/2022   NAFLD (nonalcoholic fatty liver disease) 92/76/7985   Non-alcoholic cirrhosis (HCC)    Osteoarthritis of left knee 09/03/2020   Peritoneal hematoma 07/07/2019   Sprain of MCL (medial collateral ligament) of knee 09/03/2020   Symptom associated with female genital organs 07/09/2010   Past Surgical History:  Procedure Laterality Date   ABDOMINAL HYSTERECTOMY     AXILLARY SENTINEL NODE BIOPSY Left 04/22/2023   Procedure: AXILLARY SENTINEL NODE BIOPSY;  Surgeon: Lane Shope, MD;  Location: [facility];  Service: General;  Laterality: Left;   BREAST BIOPSY Left 03/31/2023   Us  Core bx, coil clip - path pending   BREAST BIOPSY Left 03/31/2023   US  LT BREAST BX W LOC DEV  1ST LESION IMG BX SPEC US  GUIDE 03/31/2023 [facility]   BREAST BIOPSY Left 04/20/2023   US  LT RADIO FREQUENCY TAG LOC US  GUIDE 04/20/2023 [facility]   BREAST LUMPECTOMY WITH RADIOFREQUENCY TAG IDENTIFICATION Left 04/22/2023   Procedure: BREAST LUMPECTOMY WITH RADIOFREQUENCY TAG IDENTIFICATION;  Surgeon: Lane Shope, MD;  Location: [facility];  Service: General;  Laterality: Left;   CARDIAC CATHETERIZATION  03/10/2012   Negative   CATARACT EXTRACTION Bilateral    CESAREAN SECTION     CHOLECYSTECTOMY     RE-EXCISION OF BREAST LUMPECTOMY Left 05/20/2023   Procedure: EXCISION, LESION, BREAST, REPEAT;  Surgeon: Lane Shope, MD;  Location: [facility];  Service: General;  Laterality: Left;   SPLENECTOMY     TONSILLECTOMY     Patient Active Problem List   Diagnosis Date Noted   Dizziness 11/29/2023    Cerebrovascular small vessel disease 11/25/2023   Chronic nonintractable headache 09/08/2023   Osteopenia 05/27/2023   Genetic testing 05/15/2023   Change in facial mole 04/17/2023   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 breast of female, estrogen receptor positive (HCC) 04/09/2023   Invasive carcinoma of breast (HCC) 04/08/2023   Family history of breast cancer 04/08/2023   Gastroesophageal reflux disease 03/02/2023   Liver cirrhosis secondary to NASH (HCC) 03/02/2023   Bronchiectasis (HCC) 06/09/2013   Type 2 diabetes mellitus with diabetic neuropathy (HCC) 06/09/2013   HTN (hypertension), benign 06/09/2013    ONSET DATE: 10/09/23  REFERRING DIAG: R29.6 (ICD-10-CM) - Frequent falls   THERAPY DIAG:  Difficulty in walking, not elsewhere classified  Abnormal posture  Muscle weakness (generalized)  Imbalance  Rationale for Evaluation and Treatment: Rehabilitation  SUBJECTIVE:                                                                                                                                                                                             SUBJECTIVE STATEMENT:  Pt doing well. No medical or medication updates. Pt says she is making improvements. No cane use for a while.   PERTINENT HISTORY:  Pt reports she has had PT in [place] before for her neck and after her L TKA.  Pt reports that she has been having neck pain and headaches to the point where she will be having an MRI tomorrow.  Pt reports having 2 falls (August 2nd, August 6th).  Pt notes that the first fall she was trying to get out of the recliner and she fell forward.  Pt reports on the second fall, she missed a step. Pt reports she feels like she has POTS, and that it runs  in her family as well (granddaughter and grandson).   Pt had gabapentin  increased from 1 time in the morning and 1 time at night, to 2 at each time frame, and then recently switched to 3  in the morning and 3 at night. Pt reports she was told by MD to ambulate with a walker, however she has not been doing that.  PMH: asthma, CAD, DM2, heart murmur, L TKA, HTN, Depressive disorder,migraines  PAIN:  Are you having pain? No pain   PRECAUTIONS: Pt has hx of breast cancer and finished radiation back in May.    WEIGHT BEARING RESTRICTIONS: No  FALLS: Has patient fallen in last 6 months? Yes. Number of falls 2  LIVING ENVIRONMENT: Lives with: mother lives with pt Lives in: Apartment Stairs: Elevator Has following equipment at home: Single point cane and Environmental Consultant - 2 wheeled  PLOF: Independent  PATIENT GOALS: Being less wobbly and feel more balances.  OBJECTIVE:  Note: Objective measures were completed at Evaluation unless otherwise noted.  SENSATION: WFL, how pt notes burning in the bottom of her feet.  Pt reports she has neuropathy in the feet.  ORTHOSTATICS:  Eval Supine - BP: 131/52  mmHG  Sitting - BP: 112/55 mmHg  Standing - BP: 96/53 mmHg  Standing +3 Min - BP: 133/70 mmHg    11/18/23  117/36mmHg 78bpm  supine  102/62mmHg 80bpm seated 0 118/15mmHg 84bpm seated x1 minute 87/7mmHg 84bpm standing x0 minutes 104/65mmHg 88bpm standing x1 minute 105/13mmHg 87bpm standing x2 minute 110/53mmHg 90bpm standing post [id number]   9/12:  Seated 121/63 HR 68bpm  Standing 0 min 120/60 HR79  Standing 1 min 127/69 HR 86  Following gait for 17ft 126/56 HR82     9/17:  Seated: 115/61 HR 76 Standing: 81/49 HR 79 mild lightheadedness Standing 1 min 101/60 HR 87  reduced s/s but still slightly present  9/24: Seated 132/64 HR 67  Standing : 116/61 HR 70   Standing 1 min: 131/63 HR 72  9/30:  -Seated: 119/54 HR 66 -Standing 0 min 110/58 HR 70 mild light headedness as well as tingling in leg -Standing 2 min: 121/65 HR 71   -Sitting:  124/52 HR 65   FUNCTIONAL TESTS:  5 times sit to stand: 19.04 sec Timed up and go (TUG): 14.68 sec 6 minute walk test:  9/10:  tolerates on 454ft over 20m26sec before legs before increasingly ataxic  9/12: gait training with use of 4WW; tolerated 689ft, for 4:32, no significant ataxia noted, and only mild pain in sacrum area.  10 meter walk test: 15.32 sec; 0.65 m/s Dynamic Gait Index   PATIENT SURVEYS:  ABC Scale:  64% (9/10)                                                                                                                               TREATMENT DATE: 01/08/24 -Orthostatic vitals  121/26mmHg  65bpm seated  78/70mmHg 72bpm 116/46mmHg 75bpm  No symptoms Has taken meds, but not eaten breakfast.  -STS from chair hands free x15 -Airex, normal stance, alternating eyes closed 3sec, eyes open (x2 minutes) -Airex, normal stance, alternating eyes closed 5sec, eyes open (x2 minutes)  -AMB in hallway with horizontal head turns, reading playing cards: 2x 65ft -airex pad stance + 90 degree trunk rotation + overhead wall rebounding x20 (alteranting sides)  -firm surface, alterante knee/ball dribble and catch  -firm surface 180 degree turns + overhead wall rebounding, band around knees x20 -backward AMB x312ft with ball toss catch   PATIENT EDUCATION: Education details: BP is still orthostatic, recommend eating prior to taking meds.  Person educated: Patient Education method: Explanation Education comprehension: verbalized understanding  HOME EXERCISE PROGRAM: Access Code: [email or web address] URL: [email or web address] Date: 12/08/2023 Prepared by: Massie Dollar  Exercises - Backward Walking with Counter Support  - 1 x daily - 5 x weekly - 3 sets - 10 reps - Seated Gaze Stabilization with Head Rotation  - 1 x daily - 5 x weekly - 3 sets - 10 reps - Seated Gaze Stabilization with Head Nod  - 1 x daily - 5 x weekly - 3 sets - 10 reps - Sit to Stand with Arms Crossed  - 1 x daily - 5 x weekly - 3 sets - 10 reps - Side Stepping with Counter Support  - 1 x daily - 5 x weekly - 3 sets - 10  reps  GOALS: Goals reviewed with patient? Yes  SHORT TERM GOALS: Target date: 12/03/2023   Pt will be independent with HEP in order to demonstrate increased ability to perform tasks related to occupation/hobbies. Baseline: to be given at subsequent visit Goal status: INITIAL  LONG TERM GOALS: Target date: 01/28/2024  1.  Patient (> 40 years old) will complete five times sit to stand test in < 15 seconds indicating an increased LE strength and improved balance. Baseline: 19.04 sec 10/8: 12.89 sec  Goal status: MET  2.  Pt will improve ABC by at least 13% in order to demonstrate clinically significant improvement in balance confidence.  Baseline: 64% (9/10)   10/8: 71.25% Goal status: in progress   3.  Pt will improve FGA by at least 3 points in order to demonstrate clinically significant improvement in balance and decreased risk for falls. Baseline: 12/16/23: 19 Goal status: adjusted/INITIAL   4.  Patient will reduce timed up and go to <11 seconds to reduce fall risk and demonstrate improved transfer/gait ability. Baseline: 14.68 sec 10/8: 9.59 sec without AD. Goal status: MET  5.  Patient will increase 10 meter walk test to >1.67m/s as to improve gait speed for better community ambulation and to reduce fall risk. Baseline: 15.32 sec; 0.65 m/s 10/8: normal speed: 0.93 m/s. Average Fast speed: 1.1 m/s Goal status: In progress  6.  Patient will increase six minute walk test distance to >1000 for progression to community ambulator and improve gait ability Baseline: 9/10: tolerates on 426ft over 66m26sec before legs before increasingly ataxic  10/8: 91ft without UE support  Goal status: In progress   ASSESSMENT:  CLINICAL IMPRESSION:  Orthostasis persists, albeit without symptoms. Continued to work on balance, strength, and multidirecitonal gait control. Pt will continue to benefit from skilled PT to address balance and gait deficits to improve overall safety with mobility and  improve overall QoL.    OBJECTIVE IMPAIRMENTS: Abnormal gait, decreased balance, decreased mobility, difficulty walking, dizziness, and improper body mechanics.   ACTIVITY LIMITATIONS: lifting, sitting, standing, stairs,  transfers, reach over head, and locomotion level  PARTICIPATION LIMITATIONS: meal prep, cleaning, laundry, driving, shopping, community activity, and yard work  PERSONAL FACTORS: Age, Time since onset of injury/illness/exacerbation, and 3+ comorbidities: asthma, CAD, DM2, heart murmur, L TKA, HTN, Depressive disorder,migraines are also affecting patient's functional outcome.   REHAB POTENTIAL: Good  CLINICAL DECISION MAKING: Evolving/moderate complexity  EVALUATION COMPLEXITY: Moderate  PLAN:  PT FREQUENCY: 2x/week  PT DURATION: 12 weeks  PLANNED INTERVENTIONS: 97750- Physical Performance Testing, 97110-Therapeutic exercises, 97530- Therapeutic activity, 97112- Neuromuscular re-education, 97535- Self Care, [id number]- Manual therapy, [phone]- Gait training, [phone]- Canalith repositioning, Balance training, and Stair training  PLAN FOR NEXT SESSION:  -FU with MD on continued orthostatic issues  -encourage pt to take her numerous AM medications   9:04 AM, 01/08/24 Peggye JAYSON Linear, PT, DPT Physical Therapist - [facility]  Outpatient Physical Therapy- Main Campus [phone]

## 2024-01-09 ENCOUNTER — Ambulatory Visit: Payer: Self-pay | Admitting: Cardiovascular Disease

## 2024-01-11 ENCOUNTER — Encounter: Payer: Self-pay | Admitting: General Practice

## 2024-01-11 ENCOUNTER — Ambulatory Visit: Attending: Neurology | Admitting: Physical Therapy

## 2024-01-11 DIAGNOSIS — R293 Abnormal posture: Secondary | ICD-10-CM | POA: Insufficient documentation

## 2024-01-11 DIAGNOSIS — R2689 Other abnormalities of gait and mobility: Secondary | ICD-10-CM | POA: Diagnosis present

## 2024-01-11 DIAGNOSIS — R262 Difficulty in walking, not elsewhere classified: Secondary | ICD-10-CM | POA: Diagnosis present

## 2024-01-11 DIAGNOSIS — M6281 Muscle weakness (generalized): Secondary | ICD-10-CM | POA: Diagnosis present

## 2024-01-11 NOTE — Therapy (Signed)
 OUTPATIENT PHYSICAL THERAPY NEURO  Patient Name: Bianca Rogers MRN: [id number] DOB:[birth year], 73 y.o., female Today's Date: 01/11/2024  PCP: Vincente Shivers, NP  REFERRING PROVIDER: Lane Arthea BRAVO, MD  END OF SESSION:  PT End of Session - 01/11/24 1153     Visit Number 18    Number of Visits 25    Date for Recertification  01/28/24    Authorization Type [employer] medicare    Authorization Time Period 11/05/23-02/07/24 24 visits    Progress Note Due on Visit 20    PT Start Time 0859    PT Stop Time 0935    PT Time Calculation (min) 36 min    Equipment Utilized During Treatment Gait belt    Activity Tolerance Patient tolerated treatment well;No increased pain    Behavior During Therapy [facility] for tasks assessed/performed              Past Medical History:  Diagnosis Date   Anal fissure 12/09/2016   Added automatically from request for surgery [id number]    Added automatically from request for surgery [id number]     Arthralgia of left knee 12/03/2020   Asthma 08/29/2022   Chronic/stable.    Follows with Dr. Elayne, pulmonology.   Using Flovent  and albuterol  inhaler as needed.     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     Heart murmur    History of total knee arthroplasty 08/21/2021   Hyperlipidemia 08/29/2022   Lab Results   Component Value Date    Cholesterol 197 01/28/2022      Lab Results   Component Value Date    HDL 56 01/28/2022      Lab Results   Component Value Date    LDL Calculated 115 01/28/2022      Lab Results   Component Value Date    Triglycerides 131 01/28/2022      Lab Results   Component Value Date    Chol/HDL  Ratio 3.5 01/28/2022      Criteria used to determine recommendation include:    Hypertension    Invasive carcinoma of breast (HCC)    Liver cirrhosis secondary to NASH (HCC)    Lung nodule    Major depressive disorder in full remission 04/02/2020   Mood stable on venlafaxine  150 mg daily.     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 breast of female, estrogen receptor positive (HCC)    Migraine 08/29/2022   NAFLD (nonalcoholic fatty liver disease) 92/76/7985   Non-alcoholic cirrhosis (HCC)    Osteoarthritis of left knee 09/03/2020   Peritoneal hematoma 07/07/2019   Sprain of MCL (medial collateral ligament) of knee 09/03/2020   Symptom associated with female genital organs 07/09/2010   Past Surgical History:  Procedure Laterality Date   ABDOMINAL HYSTERECTOMY     AXILLARY SENTINEL NODE BIOPSY Left 04/22/2023   Procedure: AXILLARY SENTINEL NODE BIOPSY;  Surgeon: Lane Shope, MD;  Location: [facility];  Service: General;  Laterality: Left;   BREAST BIOPSY Left 03/31/2023   Us  Core bx, coil clip - path pending   BREAST BIOPSY Left 03/31/2023   US  LT BREAST BX W LOC  DEV 1ST LESION IMG BX SPEC US  GUIDE 03/31/2023 [facility]   BREAST BIOPSY Left 04/20/2023   US  LT RADIO FREQUENCY TAG LOC US  GUIDE 04/20/2023 [facility]   BREAST LUMPECTOMY WITH RADIOFREQUENCY TAG IDENTIFICATION Left 04/22/2023   Procedure: BREAST LUMPECTOMY WITH RADIOFREQUENCY TAG IDENTIFICATION;  Surgeon: Lane Shope, MD;  Location: [facility];  Service: General;  Laterality: Left;   CARDIAC CATHETERIZATION  03/10/2012   Negative   CATARACT EXTRACTION Bilateral    CESAREAN SECTION     CHOLECYSTECTOMY     RE-EXCISION OF BREAST LUMPECTOMY Left 05/20/2023   Procedure: EXCISION, LESION, BREAST, REPEAT;  Surgeon: Lane Shope, MD;  Location: [facility];  Service: General;  Laterality: Left;   SPLENECTOMY     TONSILLECTOMY     Patient Active Problem List   Diagnosis Date Noted   Dizziness  11/29/2023   Cerebrovascular small vessel disease 11/25/2023   Chronic nonintractable headache 09/08/2023   Osteopenia 05/27/2023   Genetic testing 05/15/2023   Change in facial mole 04/17/2023   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 breast of female, estrogen receptor positive (HCC) 04/09/2023   Invasive carcinoma of breast (HCC) 04/08/2023   Family history of breast cancer 04/08/2023   Gastroesophageal reflux disease 03/02/2023   Liver cirrhosis secondary to NASH (HCC) 03/02/2023   Bronchiectasis (HCC) 06/09/2013   Type 2 diabetes mellitus with diabetic neuropathy (HCC) 06/09/2013   HTN (hypertension), benign 06/09/2013    ONSET DATE: 10/09/23  REFERRING DIAG: R29.6 (ICD-10-CM) - Frequent falls   THERAPY DIAG:  Abnormal posture  Difficulty in walking, not elsewhere classified  Imbalance  Muscle weakness (generalized)  Rationale for Evaluation and Treatment: Rehabilitation  SUBJECTIVE:                                                                                                                                                                                             SUBJECTIVE STATEMENT:  Late start to PT treatment due to late check in. Pt doing well. No medical or medication updates. Has had no dizziness since last PT. Legs were a little sore following last PT session.   PERTINENT HISTORY:  Pt reports she has had PT in [place] before for her neck and after her L TKA.  Pt reports that she has been having neck pain and headaches to the point where she will be having an MRI tomorrow.  Pt reports having 2 falls (August 2nd, August 6th).  Pt notes that the first fall she was trying to get out of the recliner and she fell forward.  Pt reports on the second fall, she  missed a step. Pt reports she feels like she has POTS, and that it runs in her family as well (granddaughter and grandson).   Pt had gabapentin  increased from 1 time in  the morning and 1 time at night, to 2 at each time frame, and then recently switched to 3 in the morning and 3 at night. Pt reports she was told by MD to ambulate with a walker, however she has not been doing that.  PMH: asthma, CAD, DM2, heart murmur, L TKA, HTN, Depressive disorder,migraines  PAIN:  Are you having pain? No pain   PRECAUTIONS: Pt has hx of breast cancer and finished radiation back in May.    WEIGHT BEARING RESTRICTIONS: No  FALLS: Has patient fallen in last 6 months? Yes. Number of falls 2  LIVING ENVIRONMENT: Lives with: mother lives with pt Lives in: Apartment Stairs: Elevator Has following equipment at home: Single point cane and Environmental Consultant - 2 wheeled  PLOF: Independent  PATIENT GOALS: Being less wobbly and feel more balances.  OBJECTIVE:  Note: Objective measures were completed at Evaluation unless otherwise noted.  SENSATION: WFL, how pt notes burning in the bottom of her feet.  Pt reports she has neuropathy in the feet.  ORTHOSTATICS:  Eval Supine - BP: 131/52  mmHG  Sitting - BP: 112/55 mmHg  Standing - BP: 96/53 mmHg  Standing +3 Min - BP: 133/70 mmHg    11/18/23  117/71mmHg 78bpm  supine  102/51mmHg 80bpm seated 0 118/67mmHg 84bpm seated x1 minute 87/24mmHg 84bpm standing x0 minutes 104/28mmHg 88bpm standing x1 minute 105/69mmHg 87bpm standing x2 minute 110/36mmHg 90bpm standing post [id number]   9/12:  Seated 121/63 HR 68bpm  Standing 0 min 120/60 HR79  Standing 1 min 127/69 HR 86  Following gait for 144ft 126/56 HR82     9/17:  Seated: 115/61 HR 76 Standing: 81/49 HR 79 mild lightheadedness Standing 1 min 101/60 HR 87  reduced s/s but still slightly present  9/24: Seated 132/64 HR 67  Standing : 116/61 HR 70   Standing 1 min: 131/63 HR 72  9/30:  -Seated: 119/54 HR 66 -Standing 0 min 110/58 HR 70 mild light headedness as well as tingling in leg -Standing 2 min: 121/65 HR 71   -Sitting:  124/52 HR 65  10/31:  -Orthostatic vitals   121/66mmHg  65bpm seated  78/23mmHg 72bpm 116/2mmHg 75bpm  01/11/2024:  Seated: 123/63 HR 70 Standing: 116/62 HR 81   FUNCTIONAL TESTS:  5 times sit to stand: 19.04 sec Timed up and go (TUG): 14.68 sec 6 minute walk test:  9/10: tolerates on 453ft over 67m26sec before legs before increasingly ataxic  9/12: gait training with use of 4WW; tolerated 683ft, for 4:32, no significant ataxia noted, and only mild pain in sacrum area.  10 meter walk test: 15.32 sec; 0.65 m/s Dynamic Gait Index   PATIENT SURVEYS:  ABC Scale:  64% (9/10)  TREATMENT DATE: 01/11/24 01/11/2024:  Orthostatic VS assessment  Seated: 123/63 HR 70 Standing: 116/62 HR 81 Pt asymptomatic.   Standing on airex Pad:  Holding 2KG ball x 30 sec  Diagonal raises 2x 15 bil  Weight pass from 1 UE to the other with Shoulder hrz ABD. 3# DB x 20 bil and then 2#DB on second bout due to shoulder fatigue.  Min cues for improved hip hinge with squat to bring ball into lower position of diagonal raise.  Performed VOR cancellation to maintain gaze on ball and weight throughout standing balance on airex pad. CGA from PT throughout but no overt LOB8  Gait with gaze stabilization with nods 45ft x 4 and head turns x 4  CGA from PT for safety with R side lateral veering/staggering stepping with head turns and visual disturbance with people walking in front of gaze target. Reports no dizziness   Stepping over canes forward then backward x 4 bouts with CGA for safety. Reciprocal pattern with  forward; step to pattern with reverse.    No symptoms  PATIENT EDUCATION: Education details: BP is still orthostatic, recommend eating prior to taking meds.  Person educated: Patient Education method: Explanation Education comprehension: verbalized understanding  HOME EXERCISE PROGRAM: Access Code: [email or web address] URL:  [email or web address] Date: 12/08/2023 Prepared by: Massie Dollar  Exercises - Backward Walking with Counter Support  - 1 x daily - 5 x weekly - 3 sets - 10 reps - Seated Gaze Stabilization with Head Rotation  - 1 x daily - 5 x weekly - 3 sets - 10 reps - Seated Gaze Stabilization with Head Nod  - 1 x daily - 5 x weekly - 3 sets - 10 reps - Sit to Stand with Arms Crossed  - 1 x daily - 5 x weekly - 3 sets - 10 reps - Side Stepping with Counter Support  - 1 x daily - 5 x weekly - 3 sets - 10 reps  GOALS: Goals reviewed with patient? Yes  SHORT TERM GOALS: Target date: 12/03/2023   Pt will be independent with HEP in order to demonstrate increased ability to perform tasks related to occupation/hobbies. Baseline: to be given at subsequent visit Goal status: INITIAL  LONG TERM GOALS: Target date: 01/28/2024  1.  Patient (> 70 years old) will complete five times sit to stand test in < 15 seconds indicating an increased LE strength and improved balance. Baseline: 19.04 sec 10/8: 12.89 sec  Goal status: MET  2.  Pt will improve ABC by at least 13% in order to demonstrate clinically significant improvement in balance confidence.  Baseline: 64% (9/10)   10/8: 71.25% Goal status: in progress   3.  Pt will improve FGA by at least 3 points in order to demonstrate clinically significant improvement in balance and decreased risk for falls. Baseline: 12/16/23: 19 Goal status: adjusted/INITIAL   4.  Patient will reduce timed up and go to <11 seconds to reduce fall risk and demonstrate improved transfer/gait ability. Baseline: 14.68 sec 10/8: 9.59 sec without AD. Goal status: MET  5.  Patient will increase 10 meter walk test to >1.7m/s as to improve gait speed for better community ambulation and to reduce fall risk. Baseline: 15.32 sec; 0.65 m/s 10/8: normal speed: 0.93 m/s. Average Fast speed: 1.1 m/s Goal status: In progress  6.  Patient will increase six minute walk test  distance to >1000 for progression to community ambulator and improve gait ability Baseline: 9/10: tolerates on 431ft over 46m26sec before  legs before increasingly ataxic  10/8: 980ft without UE support  Goal status: In progress   ASSESSMENT:  CLINICAL IMPRESSION:  No Orthostasis  on this day. Continued to work on balance, strength, and multidirecitonal gait control. VOR and VOR cancellation in stance and with gait on this day; mild veer to the R when obstacles appear in line of sight with horz VOR. Was able to self correct without assist on this day.  Pt will continue to benefit from skilled PT to address balance and gait deficits to improve overall safety with mobility and improve overall QoL.    OBJECTIVE IMPAIRMENTS: Abnormal gait, decreased balance, decreased mobility, difficulty walking, dizziness, and improper body mechanics.   ACTIVITY LIMITATIONS: lifting, sitting, standing, stairs, transfers, reach over head, and locomotion level  PARTICIPATION LIMITATIONS: meal prep, cleaning, laundry, driving, shopping, community activity, and yard work  PERSONAL FACTORS: Age, Time since onset of injury/illness/exacerbation, and 3+ comorbidities: asthma, CAD, DM2, heart murmur, L TKA, HTN, Depressive disorder,migraines are also affecting patient's functional outcome.   REHAB POTENTIAL: Good  CLINICAL DECISION MAKING: Evolving/moderate complexity  EVALUATION COMPLEXITY: Moderate  PLAN:  PT FREQUENCY: 2x/week  PT DURATION: 12 weeks  PLANNED INTERVENTIONS: 97750- Physical Performance Testing, 97110-Therapeutic exercises, 97530- Therapeutic activity, 97112- Neuromuscular re-education, 97535- Self Care, [id number]- Manual therapy, [phone]- Gait training, [phone]- Canalith repositioning, Balance training, and Stair training  PLAN FOR NEXT SESSION:  -FU with MD on continued orthostatic issues  -encourage pt to take her numerous AM medications  Continue VOR and gaze stabilization with gait   Massie Dollar PT, DPT  Physical Therapist - [facility]  12:17 PM 01/11/24

## 2024-01-12 ENCOUNTER — Inpatient Hospital Stay: Admitting: Oncology

## 2024-01-12 ENCOUNTER — Inpatient Hospital Stay: Attending: Oncology

## 2024-01-12 ENCOUNTER — Other Ambulatory Visit: Payer: Self-pay | Admitting: Oncology

## 2024-01-12 ENCOUNTER — Encounter: Payer: Self-pay | Admitting: Oncology

## 2024-01-12 VITALS — BP 131/64 | HR 76 | Temp 97.5°F | Resp 16 | Wt 147.0 lb

## 2024-01-12 DIAGNOSIS — N644 Mastodynia: Secondary | ICD-10-CM | POA: Insufficient documentation

## 2024-01-12 DIAGNOSIS — Z923 Personal history of irradiation: Secondary | ICD-10-CM | POA: Insufficient documentation

## 2024-01-12 DIAGNOSIS — Z8049 Family history of malignant neoplasm of other genital organs: Secondary | ICD-10-CM | POA: Diagnosis not present

## 2024-01-12 DIAGNOSIS — Z170 Estrogen receptor positive status [ER+]: Secondary | ICD-10-CM | POA: Diagnosis not present

## 2024-01-12 DIAGNOSIS — C50919 Malignant neoplasm of unspecified site of unspecified female breast: Secondary | ICD-10-CM

## 2024-01-12 DIAGNOSIS — C50512 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 female breast: Secondary | ICD-10-CM | POA: Diagnosis present

## 2024-01-12 DIAGNOSIS — K7460 Unspecified cirrhosis of liver: Secondary | ICD-10-CM | POA: Insufficient documentation

## 2024-01-12 DIAGNOSIS — M8580 Other specified disorders of bone density and structure, unspecified site: Secondary | ICD-10-CM

## 2024-01-12 DIAGNOSIS — Z803 Family history of malignant neoplasm of breast: Secondary | ICD-10-CM | POA: Insufficient documentation

## 2024-01-12 LAB — CMP (CANCER CENTER ONLY)
ALT: 21 U/L (ref 0–44)
AST: 32 U/L (ref 15–41)
Albumin: 3.8 g/dL (ref 3.5–5.0)
Alkaline Phosphatase: 98 U/L (ref 38–126)
Anion gap: 11 (ref 5–15)
BUN: 25 mg/dL — ABNORMAL HIGH (ref 8–23)
CO2: 24 mmol/L (ref 22–32)
Calcium: 9.2 mg/dL (ref 8.9–10.3)
Chloride: 103 mmol/L (ref 98–111)
Creatinine: 1.07 mg/dL — ABNORMAL HIGH (ref 0.44–1.00)
GFR, Estimated: 55 mL/min — ABNORMAL LOW (ref >60)
Glucose, Bld: 111 mg/dL — ABNORMAL HIGH (ref 70–99)
Potassium: 4.2 mmol/L (ref 3.5–5.1)
Sodium: 138 mmol/L (ref 135–145)
Total Bilirubin: 0.5 mg/dL (ref 0.0–1.2)
Total Protein: 6.8 g/dL (ref 6.5–8.1)

## 2024-01-12 LAB — CBC WITH DIFFERENTIAL (CANCER CENTER ONLY)
Abs Immature Granulocytes: 0.04 K/uL (ref 0.00–0.07)
Basophils Absolute: 0.1 K/uL (ref 0.0–0.1)
Basophils Relative: 1 %
Eosinophils Absolute: 0.4 K/uL (ref 0.0–0.5)
Eosinophils Relative: 4 %
HCT: 36.2 % (ref 36.0–46.0)
Hemoglobin: 11.7 g/dL — ABNORMAL LOW (ref 12.0–15.0)
Immature Granulocytes: 0 %
Lymphocytes Relative: 39 %
Lymphs Abs: 3.7 K/uL (ref 0.7–4.0)
MCH: 29.5 pg (ref 26.0–34.0)
MCHC: 32.3 g/dL (ref 30.0–36.0)
MCV: 91.2 fL (ref 80.0–100.0)
Monocytes Absolute: 1.4 K/uL — ABNORMAL HIGH (ref 0.1–1.0)
Monocytes Relative: 15 %
Neutro Abs: 3.9 K/uL (ref 1.7–7.7)
Neutrophils Relative %: 41 %
Platelet Count: 369 K/uL (ref 150–400)
RBC: 3.97 MIL/uL (ref 3.87–5.11)
RDW: 14.2 % (ref 11.5–15.5)
WBC Count: 9.4 K/uL (ref 4.0–10.5)
nRBC: 0 % (ref 0.0–0.2)

## 2024-01-12 NOTE — Assessment & Plan Note (Addendum)
 Left breast invasive carcinoma G3, with high-grade DCIS s/p lumpectomy SLNB, positive margin, s/p re-excision. pT1b pN0 ER100%, PR 95% HER2 (0) Oncotype Dx score 21, will not offer adjuvant chemotherapy.  S/p adjuvant radiation  Recommend adjuvant endocrine therapy.  Previously on Arimidex , osteopenia, patient declines bone strengthening agent due to dental issue and switched to Tamoxifen  20 mg daily.   She is current off due to being on adjustment for other medications.  Recommend pt to resume and follow up in 3 months for evaluation of tolerance.  Recommend aspirin  81 mg daily for thrombosis prophylaxis..  Obtain bilateral diagnostic mammogram.

## 2024-01-12 NOTE — Progress Notes (Signed)
 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 pT1b pN0 ER100%, PR 95% HER2 (0) Oncotype Dx score 21, will not offer adjuvant chemotherapy.  S/p adjuvant radiation  Recommend adjuvant endocrine therapy.  Previously on Arimidex , osteopenia, patient declines bone strengthening agent due to dental issue and switched to Tamoxifen  20 mg daily.   She is current off due to being on adjustment for other medications.  Recommend pt to resume and follow up in 3 months for evaluation of tolerance.  Recommend aspirin  81 mg daily for thrombosis prophylaxis..  Obtain bilateral diagnostic mammogram.   Osteopenia DEXA results showed osteopenia, FRAX score 10-year major pathological fracture 21.9%. Recommend calcium and vitamin D supplementation.     Orders Placed This Encounter  Procedures   MM 3D DIAGNOSTIC MAMMOGRAM BILATERAL BREAST    Standing Status:   Future    Expected Date:   01/19/2024    Expiration Date:   01/11/2025    Reason for Exam (SYMPTOM  OR DIAGNOSIS REQUIRED):   hx breast cancer    Preferred imaging location?:   [facility]   CBC with Differential (Cancer Center Only)    Standing Status:   Future    Expected Date:   04/13/2024    Expiration Date:   07/12/2024   CMP (Cancer Center only)    Standing  Status:   Future    Expected Date:   04/13/2024    Expiration Date:   07/12/2024   Follow-up  3 months.  All questions were answered. The patient knows to call the clinic with any problems, questions or concerns.  Call Babara, MD, PhD [facility] Hematology Oncology 01/12/2024    HISTORY OF PRESENTING ILLNESS:  Bianca Rogers 73 y.o. female presents to establish care for left breast invasive carcinoma I have reviewed her chart and materials related to her cancer extensively and collaborated history with the patient. Summary of oncologic history is as follows: Oncology History  Invasive carcinoma of breast (HCC)  03/26/2023 Mammogram   Diagnostic unilateral left breast mammogram showed 1. 0.8 cm suspicious mass within the LOWER OUTER LEFT breast, likely representing the enlarging mammographic finding. Tissue sampling is recommended. No abnormal appearing LEFT axillary lymph nodes.   04/08/2023 Initial Diagnosis   Invasive carcinoma of breast [facility])  Patient presented for left breast diagnostic mammogram for follow-up of left breast asymmetry from outside facility.  She denies any breast concerns.  Left breast 5:00 4 cm from nipple needle core biopsy showed - INVASIVE MAMMARY CARCINOMA, NO SPECIAL TYPE.       - TUBULE FORMATION: SCORE 3       - NUCLEAR PLEOMORPHISM: SCORE 3       - MITOTIC COUNT: SCORE 2       - TOTAL SCORE: 8       - OVERALL GRADE: 3       -  LYMPHOVASCULAR INVASION: NOT IDENTIFIED       - CANCER LENGTH: 8 MM       - CALCIFICATIONS: NOT IDENTIFIED       - DUCTAL CARCINOMA IN SITU: PRESENT, HIGH-GRADE   ER 100%, PR 95%, HER2 negative [0]   Menarche at age of 61 or 33. First live birth at age of 85 OCP use: Less than 5 years of use. History of hysterectomy: Yes with removal of 1 ovary. Menopausal status: Postmenopausal History of HRT use: Denies History of chest radiation: Denies Number of previous breast biopsies: Denies Family history positive for multiple  family members with breast cancer.     04/08/2023 Cancer Staging   Staging form: Breast, AJCC 8th Edition - Clinical stage from 04/08/2023: Stage IA (cT1b, cN0, cM0, G3, ER+, PR+, HER2-) - Signed by Babara Call, MD on 04/08/2023 Stage prefix: Initial diagnosis Histologic grading system: 3 grade system    Genetic Testing   Negative CancerNext-Expanded +RNAinsight panel. 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 The CancerNext-Expanded gene panel offered by [facility] and includes sequencing, rearrangement, and RNA analysis for the following 76 genes: AIP, ALK, APC, ATM, AXIN2, BAP1, BARD1, BMPR1A, BRCA1, BRCA2, BRIP1, CDC73, CDH1, CDK4, CDKN1B, CDKN2A, CEBPA, CHEK2, CTNNA1, DDX41, DICER1, ETV6, FH, FLCN, GATA2, LZTR1, MAX, MBD4, MEN1, MET, MLH1, MSH2, MSH3, MSH6, MUTYH, NF1, NF2, NTHL1, PALB2, PHOX2B, PMS2, POT1, PRKAR1A, PTCH1, PTEN, RAD51C, RAD51D, RB1, RET, RUNX1, SDHA, SDHAF2, SDHB, SDHC, SDHD, SMAD4, SMARCA4, SMARCB1, SMARCE1, STK11, SUFU, TMEM127, TP53, TSC1, TSC2, VHL, and WT1 (sequencing and deletion/duplication); EGFR, HOXB13, KIT, MITF, PDGFRA, POLD1, and POLE (sequencing only); EPCAM and GREM1 (deletion/duplication only). Report date 05/07/23.    04/13/2023 Imaging   MRI breast bilateral w wo contrast   1. Biopsy-proven 0.8 cm malignancy within the posterior LOWER LEFT breast. No other suspicious abnormalities noted within either breast although motion artifact slightly decreases sensitivity. 2. No abnormal appearing lymph nodes.      04/22/2023 Surgery   Patient underwent left breast lumpectomy and sentinel lymph node biopsy. Pathology showed 1. Lymph node, sentinel, biopsy, #1 :      ONE LYMPH NODE, NEGATIVE FOR METASTATIC CARCINOMA (0/1).       2. Breast, lumpectomy, Left inferior tissue :      INVASIVE DUCTAL CARCINOMA, 1.0 CM, GRADE 3      DUCTAL CARCINOMA IN SITU:  SOLID AND CRIBRIFORM TYPES, INTERMEDIATE TO HIGH      GRADE      MARGINS, INVASIVE:  NEGATIVE      CLOSEST, INVASIVE:   1.5 MM FROM INFERIOR MARGIN      MARGINS, DCIS:  NEGATIVE      CLOSEST, DCIS:  1.5 MM FROM LATERAL MARGIN (SEE PART 4 FOR FINAL POSTERIOR      MARGIN)      LYMPHOVASCULAR INVASION:  NOT IDENTIFIED      PROGNOSTIC MARKERS:      ER: 100%, POSITIVE, STRONG STAINING INTENSITY      PR: 95%, POSITIVE,  MODERATE STAINING INTENSITY      HER2: NEGATIVE, 0      OTHER:  NONE      SEE ONCOLOGY TABLE       3. Lymph node, sentinel, biopsy, #2,3.4.5,6,7 :      FIVE LYMPH NODES, NEGATIVE FOR METASTATIC CARCINOMA (0/5).       4. Breast, lumpectomy, Left, additional superior and postreior margins :      DUCTAL CARCINOMA IN SITU, HIGH GRADE.      DCIS INVOLVES INKED BLACK  MARGIN / NEW POSTERIOR MARGIN.      NEW SUPERIOR MARGIN IS NEGATIVE FOR CARCINOMA. Diagnosis Note : INVASIVE CARCINOMA OF THE BREAST:  Resection      Procedure: Lumpectomy      Specimen Laterality: Left      Histologic Type: Invasive ductal carcinoma      Histologic Grade:      Glandular (Acinar)/Tubular Differentiation: 3      Nuclear Pleomorphism: 3      Mitotic Rate: 2      Overall Grade: 3      Tumor Size: 1.0 cm      Ductal Carcinoma In Situ: Solid and cribriform types, intermediate to high grade      Lymphatic and/or Vascular Invasion:  Not identified      Treatment Effect in the Breast: No known presurgical therapy      Margins: All margins negative for invasive carcinoma      Distance from Closest Margin (mm): 1.5 mm from inferior margin      Specify Closest Margin (required only if <38mm): 1.5 mm from inferior margin      DCIS Margins: Involved by DCIS      Distance from Closest Margin (mm): 0      Specify Closest Margin (required only if <71mm): New posterior margin      Regional Lymph Nodes:      Number of Lymph Nodes Examined: 6      Number of Sentinel Nodes Examined: 6      Number of Lymph Nodes with Macrometastases (>2 mm): 0      Number of Lymph Nodes with Micrometastases: 0      Number of Lymph Nodes with  Isolated Tumor Cells (=0.2 mm or =200 cells): 0      Size of Largest Metastatic Deposit (mm): NA      Extranodal Extension: NA      Distant Metastasis:      Distant Site(s) Involved: Not applicable      Breast Biomarker Testing Performed on Previous Biopsy:      Testing Performed on Case Number: [id number]      Estrogen Receptor: 100%, positive, strong staining intensity      Progesterone Receptor: 95%, positive, moderate staining intensity      HER2: Negative, 0      Pathologic Stage Classification (pTNM, AJCC 8th Edition): pT1b, pN0      Representative Tumor Block: 2/A      Comment(s): None      ([id number])     04/22/2023 Cancer Staging   Staging form: Breast, AJCC 8th Edition - Pathologic stage from 04/22/2023: Stage IA (pT1b, pN0, cM0, G3, ER+, PR+, HER2-, Oncotype DX score: 21) - Signed by Babara Call, MD on 05/27/2023 Stage prefix: Initial diagnosis Multigene prognostic tests performed: Oncotype DX Recurrence score range: Greater than or equal to 11 Histologic grading system: 3 grade system   05/20/2023 Procedure   S/p re-excision.   1. 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       - CHANGES CONSISTENT WITH PRIOR LUMPTECTOMY SITE.       - UNREMARKABLE SKIN.    06/11/2023 - 07/09/2023 Radiation Therapy   Adjuvant left breast RT    Discussed the use of AI scribe software for clinical note transcription with the patient,  who gave verbal consent to proceed.   She has recently experienced changes in her medication regimen. Tamoxifen  was temporarily discontinued while her medications were being adjusted. She took tamoxifen  for one week before stopping it and is considering resuming it now that her medications are stabilized. She is currently not taking Tylenol  or ibuprofen  due to concerns about potential overdose and headaches. She is on aspirin  81  mg daily and has started an allergy pill for a cough as prescribed by her lung doctor.  She has a history of liver cirrhosis and reports pain in the liver area. She follows up with her liver specialist and has an appointment scheduled for November 11th  She experiences breast pain, particularly on the surgical side, describing it as a burning and sharp pain that comes and goes     MEDICAL HISTORY:  Past Medical History:  Diagnosis Date   Anal fissure 12/09/2016   Added automatically from request for surgery [id number]    Added automatically from request for surgery [id number]     Arthralgia of left knee 12/03/2020   Asthma 08/29/2022   Chronic/stable.    Follows with Dr. Elayne, pulmonology.   Using Flovent  and albuterol  inhaler as needed.     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     Heart murmur    History of total knee arthroplasty 08/21/2021   Hyperlipidemia 08/29/2022   Lab Results   Component Value Date    Cholesterol 197 01/28/2022      Lab Results   Component Value Date    HDL 56 01/28/2022      Lab Results   Component Value Date    LDL Calculated 115 01/28/2022      Lab Results   Component Value Date    Triglycerides 131 01/28/2022      Lab Results   Component Value Date    Chol/HDL Ratio 3.5 01/28/2022      Criteria used to determine recommendation include:    Hypertension    Invasive carcinoma of breast (HCC)    Liver cirrhosis secondary to NASH (HCC)    Lung nodule    Major depressive disorder in full remission 04/02/2020   Mood stable on venlafaxine  150 mg daily.     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 breast of  female, estrogen receptor positive (HCC)    Migraine 08/29/2022   NAFLD (nonalcoholic fatty liver disease) 92/76/7985   Non-alcoholic cirrhosis (HCC)    Osteoarthritis of left knee 09/03/2020   Peritoneal hematoma 07/07/2019   Sprain of MCL (medial collateral ligament) of knee 09/03/2020   Symptom associated with female genital organs 07/09/2010    SURGICAL HISTORY: Past Surgical History:  Procedure Laterality Date   ABDOMINAL HYSTERECTOMY     AXILLARY SENTINEL NODE BIOPSY Left 04/22/2023   Procedure: AXILLARY SENTINEL NODE BIOPSY;  Surgeon: Lane Shope, MD;  Location: [facility];  Service: General;  Laterality: Left;   BREAST BIOPSY Left 03/31/2023   Us  Core bx, coil clip - path pending   BREAST BIOPSY Left 03/31/2023   US  LT BREAST BX W LOC DEV 1ST LESION IMG BX SPEC US  GUIDE  03/31/2023 [facility]   BREAST BIOPSY Left 04/20/2023   US  LT RADIO FREQUENCY TAG LOC US  GUIDE 04/20/2023 [facility]   BREAST LUMPECTOMY WITH RADIOFREQUENCY TAG IDENTIFICATION Left 04/22/2023   Procedure: BREAST LUMPECTOMY WITH RADIOFREQUENCY TAG IDENTIFICATION;  Surgeon: Lane Shope, MD;  Location: [facility];  Service: General;  Laterality: Left;   CARDIAC CATHETERIZATION  03/10/2012   Negative   CATARACT EXTRACTION Bilateral    CESAREAN SECTION     CHOLECYSTECTOMY     RE-EXCISION OF BREAST LUMPECTOMY Left 05/20/2023   Procedure: EXCISION, LESION, BREAST, REPEAT;  Surgeon: Lane Shope, MD;  Location: [facility];  Service: General;  Laterality: Left;   SPLENECTOMY     TONSILLECTOMY      SOCIAL HISTORY: Social History   Socioeconomic History   Marital status: Legally Separated    Spouse name: Not on file   Number of children: 4   Years of education: Not on file   Highest education level: GED or equivalent  Occupational History   Occupation: Retired  Tobacco Use   Smoking status: Former    Current packs/day: 0.00    Average packs/day: 2.0 packs/day for 7.2 years (14.5 ttl pk-yrs)     Types: Cigarettes    Start date: 23    Quit date: 06/10/1983    Years since quitting: 40.6   Smokeless tobacco: Never  Vaping Use   Vaping status: Never Used  Substance and Sexual Activity   Alcohol use: No   Drug use: No   Sexual activity: Not on file  Other Topics Concern   Not on file  Social History Narrative   Not on file   Social Drivers of Health   Financial Resource Strain: Patient Declined (09/04/2023)   Overall Financial Resource Strain (CARDIA)    Difficulty of Paying Living Expenses: Patient declined  Food Insecurity: No Food Insecurity (09/04/2023)   Hunger Vital Sign    Worried About Running Out of Food in the Last Year: Never true    Ran Out of Food in the Last Year: Never true  Transportation Needs: No Transportation Needs (09/04/2023)   PRAPARE - Administrator, Civil Service (Medical): No    Lack of Transportation (Non-Medical): No  Physical Activity: Insufficiently Active (09/04/2023)   Exercise Vital Sign    Days of Exercise per Week: 2 days    Minutes of Exercise per Session: 30 min  Stress: Stress Concern Present (09/04/2023)   [employer] of Occupational Health - Occupational Stress Questionnaire    Feeling of Stress: To some extent  Social Connections: Moderately Isolated (09/04/2023)   Social Connection and Isolation Panel    Frequency of Communication with Friends and Family: More than three times a week    Frequency of Social Gatherings with Friends and Family: Twice a week    Attends Religious Services: More than 4 times per year    Active Member of [employer] or Organizations: No    Attends Banker Meetings: Not on file    Marital Status: Separated  Intimate Partner Violence: Not At Risk (08/20/2023)   Humiliation, Afraid, Rape, and Kick questionnaire    Fear of Current or Ex-Partner: No    Emotionally Abused: No    Physically Abused: No    Sexually Abused: No    FAMILY HISTORY: Family History  Problem Relation  Age of Onset   Transient ischemic attack Mother    Thyroid disease Mother    Arthritis Mother    Hearing loss Mother  High blood pressure Mother    High Cholesterol Mother    Miscarriages / Stillbirths Mother    Stroke Mother    Cancer Father 5       metastatic, possible lung or prostate primary   Arthritis Father    Asthma Father    COPD Father    Depression Father    Early death Father    Hearing loss Father    Heart disease Father    High Cholesterol Father    Breast cancer Sister 36   Arthritis Sister    Birth defects Sister    Cancer Sister        breast cancer   Diabetes Sister    High blood pressure Sister    Diabetes Paternal Grandmother    Arthritis Paternal Grandfather    Cancer Paternal Grandfather    Breast cancer Daughter 45       TNBC   Cervical cancer Daughter    Breast cancer Niece 36    ALLERGIES:  is allergic to penicillins, zoloft [sertraline hcl], codeine, and sertraline.  MEDICATIONS:  Current Outpatient Medications  Medication Sig Dispense Refill   albuterol  (PROVENTIL  HFA) 108 (90 Base) MCG/ACT inhaler Inhale 1-2 puffs into the lungs every 6 (six) hours as needed for wheezing or shortness of breath. 8 g 6   aspirin  (ASPIRIN  81) 81 MG chewable tablet Chew 1 tablet (81 mg total) by mouth daily.     Calcium Carbonate (CALCIUM 500 PO) Take 500 mg by mouth in the morning and at bedtime.     ezetimibe  (ZETIA ) 10 MG tablet Take 1 tablet (10 mg total) by mouth at bedtime. 90 tablet 1   fluticasone -salmeterol (WIXELA INHUB) 250-50 MCG/ACT AEPB Inhale 1 puff into the lungs in the morning and at bedtime. 60 each 12   gabapentin  (NEURONTIN ) 100 MG capsule Take 1 capsule (100 mg total) by mouth 2 (two) times daily. 120 capsule 0   lisinopril  (ZESTRIL ) 20 MG tablet Take 1 tablet (20 mg total) by mouth daily. 90 tablet 1   loratadine (CLARITIN) 10 MG tablet Take 1 tablet (10 mg total) by mouth daily as needed for allergies. 90 tablet 6   metFORMIN   (GLUCOPHAGE -XR) 500 MG 24 hr tablet Take 2 tablets (1,000 mg total) by mouth 2 (two) times daily with a meal. 360 tablet 0   omeprazole  (PRILOSEC) 20 MG capsule TAKE 1 CAPSULE (20 MG TOTAL) BY MOUTH DAILY. 90 capsule 1   pravastatin  (PRAVACHOL ) 80 MG tablet Take 1 tablet (80 mg total) by mouth at bedtime. 90 tablet 1   venlafaxine  XR (EFFEXOR  XR) 150 MG 24 hr capsule Take 1 capsule (150 mg total) by mouth daily with breakfast. 90 capsule 1   VITAMIN D, CHOLECALCIFEROL, PO Take 2,000 Units by mouth daily.     clobetasol  cream (TEMOVATE ) 0.05 % Apply 1 Application topically 2 (two) times daily. (Patient not taking: Reported on 01/12/2024) 60 g 0   tamoxifen  (NOLVADEX ) 20 MG tablet Take 1 tablet (20 mg total) by mouth daily. (Patient not taking: Reported on 01/12/2024) 90 tablet 3   No current facility-administered medications for this visit.    Review of Systems  Constitutional:  Negative for appetite change, chills, fatigue and fever.  HENT:   Negative for hearing loss and voice change.   Eyes:  Negative for eye problems.  Respiratory:  Negative for chest tightness and cough.   Cardiovascular:  Negative for chest pain.  Gastrointestinal:  Negative for abdominal distention, abdominal pain and  blood in stool.  Endocrine: Negative for hot flashes.  Genitourinary:  Negative for difficulty urinating and frequency.   Musculoskeletal:  Positive for arthralgias and gait problem.  Skin:  Negative for itching and rash.  Neurological:  Positive for gait problem. Negative for extremity weakness.  Hematological:  Negative for adenopathy.  Psychiatric/Behavioral:  Negative for confusion.      PHYSICAL EXAMINATION: ECOG PERFORMANCE STATUS: 1 - Symptomatic but completely ambulatory  Vitals:   01/12/24 1006  BP: 131/64  Pulse: 76  Resp: 16  Temp: (!) 97.5 F (36.4 C)  SpO2: 100%   Filed Weights   01/12/24 1006  Weight: 147 lb (66.7 kg)    Physical Exam HENT:     Head: Normocephalic and  atraumatic.  Eyes:     General: No scleral icterus. Cardiovascular:     Rate and Rhythm: Normal rate and regular rhythm.  Pulmonary:     Effort: Pulmonary effort is normal. No respiratory distress.  Abdominal:     General: There is no distension.  Musculoskeletal:        General: Normal range of motion.     Cervical back: Normal range of motion and neck supple.  Skin:    Findings: No erythema.  Neurological:     Mental Status: She is alert and oriented to person, place, and time. Mental status is at baseline.     Motor: No abnormal muscle tone.  Psychiatric:        Mood and Affect: Mood and affect normal.      LABORATORY DATA:  I have reviewed the data as listed    Latest Ref Rng & Units 01/12/2024    9:56 AM 11/11/2023   11:51 AM 10/12/2023    9:30 AM  CBC  WBC 4.0 - 10.5 K/uL 9.4  9.0  8.4   Hemoglobin 12.0 - 15.0 g/dL 88.2  88.5  88.1   Hematocrit 36.0 - 46.0 % 36.2  34.5  36.0   Platelets 150 - 400 K/uL 369  331.0  366       Latest Ref Rng & Units 01/12/2024    9:56 AM 11/25/2023   12:37 PM 11/11/2023   11:51 AM  CMP  Glucose 70 - 99 mg/dL 888  878  898   BUN 8 - 23 mg/dL 25  16  26    Creatinine 0.44 - 1.00 mg/dL 8.92  9.06  8.85   Sodium 135 - 145 mmol/L 138  140  137   Potassium 3.5 - 5.1 mmol/L 4.2  3.9  4.1   Chloride 98 - 111 mmol/L 103  105  101   CO2 22 - 32 mmol/L 24  23  26    Calcium 8.9 - 10.3 mg/dL 9.2  9.5  9.5   Total Protein 6.5 - 8.1 g/dL 6.8     Total Bilirubin 0.0 - 1.2 mg/dL 0.5     Alkaline Phos 38 - 126 U/L 98     AST 15 - 41 U/L 32     ALT 0 - 44 U/L 21        RADIOGRAPHIC STUDIES: I have personally reviewed the radiological images as listed and agreed with the findings in the report. ECHOCARDIOGRAM COMPLETE Result Date: 01/06/2024    ECHOCARDIOGRAM REPORT   Patient Name:   Bianca Rogers Date of Exam: 01/06/2024 Medical Rec #:  [id number]      Height:       64.0 in Accession #:    [id number]  Weight:       147.0 lb Date of Birth:   [birth year]      BSA:          1.716 m Patient Age:    73 years       BP:           142/74 mmHg Patient Gender: F              HR:           72 bpm. Exam Location:  [place] Procedure: 2D Echo, 3D Echo, Color Doppler, Cardiac Doppler and Strain Analysis            (Both Spectral and Color Flow Doppler were utilized during            procedure). Indications:    I10 Hypertension; R55 Syncope; R07.9* Chest pain, unspecified  History:        Patient has no prior history of Echocardiogram examinations.                 Signs/Symptoms:Chest Pain and Dizziness/Lightheadedness; Risk                 Factors:Diabetes.  Sonographer:    Marshall Benders Referring Phys: [id number] EVALENE JINNY LUNGER IMPRESSIONS  1. Left ventricular ejection fraction, by estimation, is 60 to 65%. Left ventricular ejection fraction by PLAX is 63 %. The left ventricle has normal function. The left ventricle has no regional wall motion abnormalities. Left ventricular diastolic parameters are consistent with Grade I diastolic dysfunction (impaired relaxation). The average left ventricular global longitudinal strain is -18.7 %. The global longitudinal strain is normal.  2. Right ventricular systolic function is normal. The right ventricular size is normal.  3. The mitral valve is normal in structure. No evidence of mitral valve regurgitation.  4. The aortic valve is tricuspid. Aortic valve regurgitation is mild. Aortic valve sclerosis/calcification is present, without any evidence of aortic stenosis. Aortic valve mean gradient measures 9.2 mmHg.  5. The inferior vena cava is normal in size with greater than 50% respiratory variability, suggesting right atrial pressure of 3 mmHg. FINDINGS  Left Ventricle: Left ventricular ejection fraction, by estimation, is 60 to 65%. Left ventricular ejection fraction by PLAX is 63 %. The left ventricle has normal function. The left ventricle has no regional wall motion abnormalities. The average left ventricular global  longitudinal strain is -18.7 %. Strain was performed and the global longitudinal strain is normal. The left ventricular internal cavity size was normal in size. There is no left ventricular hypertrophy. Left ventricular diastolic parameters are consistent with Grade I diastolic dysfunction (impaired relaxation). Right Ventricle: The right ventricular size is normal. No increase in right ventricular wall thickness. Right ventricular systolic function is normal. Left Atrium: Left atrial size was normal in size. Right Atrium: Right atrial size was normal in size. Pericardium: There is no evidence of pericardial effusion. Mitral Valve: The mitral valve is normal in structure. No evidence of mitral valve regurgitation. Tricuspid Valve: The tricuspid valve is normal in structure. Tricuspid valve regurgitation is trivial. Aortic Valve: The aortic valve is tricuspid. Aortic valve regurgitation is mild. Aortic valve sclerosis/calcification is present, without any evidence of aortic stenosis. Aortic valve mean gradient measures 9.2 mmHg. Aortic valve peak gradient measures 17.3 mmHg. Aortic valve area, by VTI measures 1.96 cm. Pulmonic Valve: The pulmonic valve was normal in structure. Pulmonic valve regurgitation is mild. Aorta: The aortic root and ascending aorta are structurally normal, with no evidence of  dilitation. Venous: The inferior vena cava is normal in size with greater than 50% respiratory variability, suggesting right atrial pressure of 3 mmHg. IAS/Shunts: No atrial level shunt detected by color flow Doppler.  LEFT VENTRICLE PLAX 2D LV EF:         Left            Diastology                ventricular     LV e' medial:    8.39 cm/s                ejection        LV E/e' medial:  10.4                fraction by     LV e' lateral:   9.01 cm/s                PLAX is 63      LV E/e' lateral: 9.6                %. LVIDd:         3.90 cm         2D Longitudinal LVIDs:         2.60 cm         Strain LV PW:         1.10  cm         2D Strain GLS   -18.7 % LV IVS:        0.90 cm         Avg: LVOT diam:     1.90 cm LV SV:         84 LV SV Index:   49 LVOT Area:     2.84 cm  RIGHT VENTRICLE             IVC RV Basal diam:  3.10 cm     IVC diam: 1.30 cm RV Mid diam:    3.00 cm RV S prime:     15.90 cm/s TAPSE (M-mode): 1.8 cm LEFT ATRIUM           Index        RIGHT ATRIUM           Index LA diam:      3.80 cm 2.21 cm/m   RA Area:     10.10 cm LA Vol (A2C): 50.9 ml 29.65 ml/m  RA Volume:   18.20 ml  10.60 ml/m LA Vol (A4C): 17.9 ml 10.43 ml/m  AORTIC VALVE AV Area (Vmax):    2.03 cm AV Area (Vmean):   2.04 cm AV Area (VTI):     1.96 cm AV Vmax:           207.75 cm/s AV Vmean:          141.750 cm/s AV VTI:            0.429 m AV Peak Grad:      17.3 mmHg AV Mean Grad:      9.2 mmHg LVOT Vmax:         148.67 cm/s LVOT Vmean:        102.100 cm/s LVOT VTI:          0.297 m LVOT/AV VTI ratio: 0.69  AORTA Ao Root diam: 3.20 cm Ao Asc diam:  2.50 cm MITRAL VALVE MV Area (PHT): 3.21 cm     SHUNTS MV Decel Time: 236 msec  Systemic VTI:  0.30 m MV E velocity: 86.90 cm/s   Systemic Diam: 1.90 cm MV A velocity: 100.00 cm/s MV E/A ratio:  0.87 Redell Cave MD Electronically signed by Redell Cave MD Signature Date/Time: 01/06/2024/2:53:43 PM    Final

## 2024-01-12 NOTE — Assessment & Plan Note (Signed)
 DEXA results showed osteopenia, FRAX score 10-year major pathological fracture 21.9%. Recommend calcium and vitamin D supplementation.

## 2024-01-13 ENCOUNTER — Ambulatory Visit: Admitting: Dermatology

## 2024-01-14 ENCOUNTER — Encounter: Payer: Self-pay | Admitting: Physical Therapy

## 2024-01-14 ENCOUNTER — Ambulatory Visit: Admitting: Physical Therapy

## 2024-01-14 DIAGNOSIS — R2689 Other abnormalities of gait and mobility: Secondary | ICD-10-CM

## 2024-01-14 DIAGNOSIS — M6281 Muscle weakness (generalized): Secondary | ICD-10-CM

## 2024-01-14 DIAGNOSIS — R293 Abnormal posture: Secondary | ICD-10-CM

## 2024-01-14 DIAGNOSIS — R262 Difficulty in walking, not elsewhere classified: Secondary | ICD-10-CM

## 2024-01-14 NOTE — Therapy (Signed)
 OUTPATIENT PHYSICAL THERAPY NEURO  Patient Name: DELONNA NEY MRN: [id number] DOB:January 23, 1951, 73 y.o., female Today's Date: 01/14/2024  PCP: Vincente Shivers, NP  REFERRING PROVIDER: Lane Arthea BRAVO, MD  END OF SESSION:  PT End of Session - 01/14/24 0850     Visit Number 19    Number of Visits 25    Date for Recertification  01/28/24    Authorization Type [employer] medicare    Authorization Time Period 11/05/23-02/07/24 24 visits    Progress Note Due on Visit 20    PT Start Time 0850    PT Stop Time 0933    PT Time Calculation (min) 43 min    Equipment Utilized During Treatment Gait belt    Activity Tolerance Patient tolerated treatment well;No increased pain    Behavior During Therapy [facility] for tasks assessed/performed              Past Medical History:  Diagnosis Date   Anal fissure 12/09/2016   Added automatically from request for surgery [id number]    Added automatically from request for surgery [id number]     Arthralgia of left knee 12/03/2020   Asthma 08/29/2022   Chronic/stable.    Follows with Dr. Elayne, pulmonology.   Using Flovent  and albuterol  inhaler as needed.     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     Heart murmur    History of total knee arthroplasty 08/21/2021   Hyperlipidemia 08/29/2022   Lab Results   Component Value Date    Cholesterol 197 01/28/2022      Lab Results   Component Value Date    HDL 56 01/28/2022      Lab Results   Component Value Date    LDL Calculated 115 01/28/2022      Lab Results   Component Value Date    Triglycerides 131 01/28/2022      Lab Results   Component Value Date    Chol/HDL  Ratio 3.5 01/28/2022      Criteria used to determine recommendation include:    Hypertension    Invasive carcinoma of breast (HCC)    Liver cirrhosis secondary to NASH (HCC)    Lung nodule    Major depressive disorder in full remission 04/02/2020   Mood stable on venlafaxine  150 mg daily.     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 breast of female, estrogen receptor positive (HCC)    Migraine 08/29/2022   NAFLD (nonalcoholic fatty liver disease) 92/76/7985   Non-alcoholic cirrhosis (HCC)    Osteoarthritis of left knee 09/03/2020   Peritoneal hematoma 07/07/2019   Sprain of MCL (medial collateral ligament) of knee 09/03/2020   Symptom associated with female genital organs 07/09/2010   Past Surgical History:  Procedure Laterality Date   ABDOMINAL HYSTERECTOMY     AXILLARY SENTINEL NODE BIOPSY Left 04/22/2023   Procedure: AXILLARY SENTINEL NODE BIOPSY;  Surgeon: Lane Shope, MD;  Location: [facility] ORS;  Service: General;  Laterality: Left;   BREAST BIOPSY Left 03/31/2023   Us  Core bx, coil clip - path pending   BREAST BIOPSY Left 03/31/2023   US  LT BREAST BX W LOC  DEV 1ST LESION IMG BX SPEC US  GUIDE 03/31/2023 [facility]   BREAST BIOPSY Left 04/20/2023   US  LT RADIO FREQUENCY TAG LOC US  GUIDE 04/20/2023 [facility]   BREAST LUMPECTOMY WITH RADIOFREQUENCY TAG IDENTIFICATION Left 04/22/2023   Procedure: BREAST LUMPECTOMY WITH RADIOFREQUENCY TAG IDENTIFICATION;  Surgeon: Lane Shope, MD;  Location: [facility] ORS;  Service: General;  Laterality: Left;   CARDIAC CATHETERIZATION  03/10/2012   Negative   CATARACT EXTRACTION Bilateral    CESAREAN SECTION     CHOLECYSTECTOMY     RE-EXCISION OF BREAST LUMPECTOMY Left 05/20/2023   Procedure: EXCISION, LESION, BREAST, REPEAT;  Surgeon: Lane Shope, MD;  Location: [facility] ORS;  Service: General;  Laterality: Left;   SPLENECTOMY     TONSILLECTOMY     Patient Active Problem List   Diagnosis Date Noted   Dizziness  11/29/2023   Cerebrovascular small vessel disease 11/25/2023   Chronic nonintractable headache 09/08/2023   Osteopenia 05/27/2023   Genetic testing 05/15/2023   Change in facial mole 04/17/2023   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 breast of female, estrogen receptor positive (HCC) 04/09/2023   Invasive carcinoma of breast (HCC) 04/08/2023   Family history of breast cancer 04/08/2023   Gastroesophageal reflux disease 03/02/2023   Liver cirrhosis secondary to NASH (HCC) 03/02/2023   Bronchiectasis (HCC) 06/09/2013   Type 2 diabetes mellitus with diabetic neuropathy (HCC) 06/09/2013   HTN (hypertension), benign 06/09/2013    ONSET DATE: 10/09/23  REFERRING DIAG: R29.6 (ICD-10-CM) - Frequent falls   THERAPY DIAG:  Abnormal posture  Difficulty in walking, not elsewhere classified  Imbalance  Muscle weakness (generalized)  Rationale for Evaluation and Treatment: Rehabilitation  SUBJECTIVE:                                                                                                                                                                                             SUBJECTIVE STATEMENT:  Pt reports not eating breakfast this morning and taking medications on an empty stomach. Reports her feet are hurting today due to her neuropathy and felt burning. Reports feet have been hurting for 4 hours, and she reports changing shoes to see if it improves.   PERTINENT HISTORY:  Pt reports she has had PT in [place] before for her neck and after her L TKA.  Pt reports that she has been having neck pain and headaches to the point where she will be having an MRI tomorrow.  Pt reports having 2 falls (August 2nd, August 6th).  Pt notes that the first fall she was trying to get out of the recliner and  she fell forward.  Pt reports on the second fall, she missed a step. Pt reports she feels like she has POTS, and that it runs in her family as well  (granddaughter and grandson).   Pt had gabapentin  increased from 1 time in the morning and 1 time at night, to 2 at each time frame, and then recently switched to 3 in the morning and 3 at night. Pt reports she was told by MD to ambulate with a walker, however she has not been doing that.  PMH: asthma, CAD, DM2, heart murmur, L TKA, HTN, Depressive disorder,migraines  PAIN:  Are you having pain? No pain   PRECAUTIONS: Pt has hx of breast cancer and finished radiation back in May.    WEIGHT BEARING RESTRICTIONS: No  FALLS: Has patient fallen in last 6 months? Yes. Number of falls 2  LIVING ENVIRONMENT: Lives with: mother lives with pt Lives in: Apartment Stairs: Elevator Has following equipment at home: Single point cane and Environmental Consultant - 2 wheeled  PLOF: Independent  PATIENT GOALS: Being less wobbly and feel more balances.  OBJECTIVE:  Note: Objective measures were completed at Evaluation unless otherwise noted.  SENSATION: WFL, how pt notes burning in the bottom of her feet.  Pt reports she has neuropathy in the feet.  ORTHOSTATICS:  Eval Supine - BP: 131/52  mmHG  Sitting - BP: 112/55 mmHg  Standing - BP: 96/53 mmHg  Standing +3 Min - BP: 133/70 mmHg    11/18/23  117/25mmHg 78bpm  supine  102/54mmHg 80bpm seated 0 118/39mmHg 84bpm seated x1 minute 87/16mmHg 84bpm standing x0 minutes 104/59mmHg 88bpm standing x1 minute 105/78mmHg 87bpm standing x2 minute 110/95mmHg 90bpm standing post [id number]   9/12:  Seated 121/63 HR 68bpm  Standing 0 min 120/60 HR79  Standing 1 min 127/69 HR 86  Following gait for 126ft 126/56 HR82     9/17:  Seated: 115/61 HR 76 Standing: 81/49 HR 79 mild lightheadedness Standing 1 min 101/60 HR 87  reduced s/s but still slightly present  9/24: Seated 132/64 HR 67  Standing : 116/61 HR 70   Standing 1 min: 131/63 HR 72  9/30:  -Seated: 119/54 HR 66 -Standing 0 min 110/58 HR 70 mild light headedness as well as tingling in leg -Standing 2  min: 121/65 HR 71   -Sitting:  124/52 HR 65  10/31:  -Orthostatic vitals  121/10mmHg  65bpm seated  78/20mmHg 72bpm 116/57mmHg 75bpm  01/11/2024:  Seated: 123/63 HR 70 Standing: 116/62 HR 81   FUNCTIONAL TESTS:  5 times sit to stand: 19.04 sec Timed up and go (TUG): 14.68 sec 6 minute walk test:  9/10: tolerates on 434ft over 39m26sec before legs before increasingly ataxic  9/12: gait training with use of 4WW; tolerated 637ft, for 4:32, no significant ataxia noted, and only mild pain in sacrum area.  10 meter walk test: 15.32 sec; 0.65 m/s Dynamic Gait Index   PATIENT SURVEYS:  ABC Scale:  64% (9/10)    [facility] PT Assessment - 01/14/24 0001       Berg Balance Test   Sit to Stand Able to stand without using hands and stabilize independently    Standing Unsupported Able to stand safely 2 minutes    Sitting with Back Unsupported but Feet Supported on Floor or Stool Able to sit safely and securely 2 minutes    Stand to Sit Sits safely with minimal use of hands    Transfers Able to transfer safely, minor use of hands  Standing Unsupported with Eyes Closed Able to stand 10 seconds with supervision    Standing Unsupported with Feet Together Able to place feet together independently and stand for 1 minute with supervision    From Standing, Reach Forward with Outstretched Arm Can reach confidently >25 cm (10)    From Standing Position, Pick up Object from Floor Able to pick up shoe safely and easily    From Standing Position, Turn to Look Behind Over each Shoulder Looks behind from both sides and weight shifts well    Turn 360 Degrees Able to turn 360 degrees safely but slowly    Standing Unsupported, Alternately Place Feet on Step/Stool Able to stand independently and safely and complete 8 steps in 20 seconds    Standing Unsupported, One Foot in Front Able to plae foot ahead of the other independently and hold 30 seconds    Standing on One Leg Able to lift leg independently and hold >  10 seconds    Total Score 51                                                                                                                                        TREATMENT DATE: 01/14/24 01/14/2024:  -Orthostatic VS assessment     Seated: 123/80 HR 69    Standing: 113/56 HR 72    Pt reports no dizziness or other symptoms.  -Patient demonstrates moderate fall risk as noted by score of  51/56 on Berg Balance Scale.  (<36= high risk for falls, close to 100%; 37-45 significant >80%; 46-51 moderate >50%; 52-55 lower >25%)  -Gaze stabilization gait with nods 71ft x 4 and head turns x 4; CGA from SPT for safety with R side lateral veering/staggering stepping with head turns and visual disturbance with people walking in front of gaze targets, reports no double vision this time  -Airex foam pad cross diagonal raise with #1 weight, 3x10 ea with cuing for gaze sustain on dumbbell through motion  -step-ups to 4in step from blue foam pad 2x10 ea, supervision assist provided and verbal cue given for knee flexion  PATIENT EDUCATION: Education details: BP is still orthostatic, recommend eating prior to taking meds.  Person educated: Patient Education method: Explanation Education comprehension: verbalized understanding  HOME EXERCISE PROGRAM: Access Code: [email or web address] URL: [email or web address] Date: 12/08/2023 Prepared by: Massie Dollar  Exercises - Backward Walking with Counter Support  - 1 x daily - 5 x weekly - 3 sets - 10 reps - Seated Gaze Stabilization with Head Rotation  - 1 x daily - 5 x weekly - 3 sets - 10 reps - Seated Gaze Stabilization with Head Nod  - 1 x daily - 5 x weekly - 3 sets - 10 reps - Sit to Stand with Arms Crossed  - 1 x daily - 5 x weekly - 3 sets - 10 reps - Side Stepping with Counter Support  -  1 x daily - 5 x weekly - 3 sets - 10 reps  GOALS: Goals reviewed with patient? Yes  SHORT TERM GOALS: Target date: 12/03/2023   Pt will be independent  with HEP in order to demonstrate increased ability to perform tasks related to occupation/hobbies. Baseline: to be given at subsequent visit Goal status: INITIAL  LONG TERM GOALS: Target date: 01/28/2024  1.  Patient (> 62 years old) will complete five times sit to stand test in < 15 seconds indicating an increased LE strength and improved balance. Baseline: 19.04 sec 10/8: 12.89 sec  Goal status: MET  2.  Pt will improve ABC by at least 13% in order to demonstrate clinically significant improvement in balance confidence.  Baseline: 64% (9/10)   10/8: 71.25% Goal status: in progress   3.  Pt will improve FGA by at least 3 points in order to demonstrate clinically significant improvement in balance and decreased risk for falls. Baseline: 12/16/23: 19 Goal status: adjusted/INITIAL   4.  Patient will reduce timed up and go to <11 seconds to reduce fall risk and demonstrate improved transfer/gait ability. Baseline: 14.68 sec 10/8: 9.59 sec without AD. Goal status: MET  5.  Patient will increase 10 meter walk test to >1.32m/s as to improve gait speed for better community ambulation and to reduce fall risk. Baseline: 15.32 sec; 0.65 m/s 10/8: normal speed: 0.93 m/s. Average Fast speed: 1.1 m/s Goal status: In progress  6.  Patient will increase six minute walk test distance to >1000 for progression to community ambulator and improve gait ability Baseline: 9/10: tolerates on 412ft over 85m26sec before legs before increasingly ataxic  10/8: 92ft without UE support  Goal status: In progress   ASSESSMENT:  CLINICAL IMPRESSION:  Pt presents with no orthostasis beginning today's session and reports no symptoms. Began with performance of BERG balance scale, with pt scoring 51/56, indicating a moderate fall risk. Continued with VOR nods and head turns in hallway, with patient showing improved symptoms of dizziness and less R and L way veering during ambulation. Continued to work on balance  and strength activities. Pt performed crossbody #1 arm weighted- activity on foam pad while looking up at arms in order to encourage thoracic rotation and upwards gaze. Pt performed step ups from foam pad onto step and showed good postural control during activity, slightly struggling more with control while stepping up and down with the L side, requiring supervision assist only. Pt will continue to benefit from skilled PT to address balance and gait deficits to improve overall safety with mobility and improve overall QoL.    OBJECTIVE IMPAIRMENTS: Abnormal gait, decreased balance, decreased mobility, difficulty walking, dizziness, and improper body mechanics.   ACTIVITY LIMITATIONS: lifting, sitting, standing, stairs, transfers, reach over head, and locomotion level  PARTICIPATION LIMITATIONS: meal prep, cleaning, laundry, driving, shopping, community activity, and yard work  PERSONAL FACTORS: Age, Time since onset of injury/illness/exacerbation, and 3+ comorbidities: asthma, CAD, DM2, heart murmur, L TKA, HTN, Depressive disorder,migraines are also affecting patient's functional outcome.   REHAB POTENTIAL: Good  CLINICAL DECISION MAKING: Evolving/moderate complexity  EVALUATION COMPLEXITY: Moderate  PLAN:  PT FREQUENCY: 2x/week  PT DURATION: 12 weeks  PLANNED INTERVENTIONS: 97750- Physical Performance Testing, 97110-Therapeutic exercises, 97530- Therapeutic activity, [id number]- Neuromuscular re-education, 97535- Self Care, [id number]- Manual therapy, [id number]- Gait training, [phone]- Canalith repositioning, Balance training, and Stair training  PLAN FOR NEXT SESSION:  -consider giving DHI to fill out -complete progress note and associated outcome and functional tests and measures -  continue to encourage eating breakfast before taking morning medications  Renna Helling, SPT 01/14/2024

## 2024-01-18 ENCOUNTER — Ambulatory Visit: Admitting: Physical Therapy

## 2024-01-18 ENCOUNTER — Encounter: Payer: Self-pay | Admitting: Physical Therapy

## 2024-01-18 DIAGNOSIS — R293 Abnormal posture: Secondary | ICD-10-CM

## 2024-01-18 DIAGNOSIS — R262 Difficulty in walking, not elsewhere classified: Secondary | ICD-10-CM

## 2024-01-18 DIAGNOSIS — R2689 Other abnormalities of gait and mobility: Secondary | ICD-10-CM

## 2024-01-18 DIAGNOSIS — M6281 Muscle weakness (generalized): Secondary | ICD-10-CM

## 2024-01-18 NOTE — Therapy (Signed)
 OUTPATIENT PHYSICAL THERAPY NEURO PROGRESS NOTE  Patient Name: Bianca Rogers MRN: [id number] DOB:05/24/1950, 73 y.o., female Today's Date: 01/18/2024  PCP: Vincente Shivers, NP  REFERRING PROVIDER: Lane Arthea BRAVO, MD  END OF SESSION:  PT End of Session - 01/18/24 0848     Visit Number 20    Number of Visits 25    Date for Recertification  01/28/24    Authorization Type [employer] medicare    Authorization Time Period 11/05/23-02/07/24 24 visits    Progress Note Due on Visit 20    PT Start Time 0848    PT Stop Time 0930    PT Time Calculation (min) 42 min    Equipment Utilized During Treatment Gait belt    Activity Tolerance Patient tolerated treatment well;No increased pain    Behavior During Therapy [facility] for tasks assessed/performed              Past Medical History:  Diagnosis Date   Anal fissure 12/09/2016   Added automatically from request for surgery [id number]    Added automatically from request for surgery [id number]     Arthralgia of left knee 12/03/2020   Asthma 08/29/2022   Chronic/stable.    Follows with Dr. Elayne, pulmonology.   Using Flovent  and albuterol  inhaler as needed.     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     Heart murmur    History of total knee arthroplasty 08/21/2021   Hyperlipidemia 08/29/2022   Lab Results   Component Value Date    Cholesterol 197 01/28/2022      Lab Results   Component Value Date    HDL 56 01/28/2022      Lab Results   Component Value Date    LDL Calculated 115 01/28/2022      Lab Results   Component Value Date    Triglycerides 131 01/28/2022      Lab Results   Component Value Date     Chol/HDL Ratio 3.5 01/28/2022      Criteria used to determine recommendation include:    Hypertension    Invasive carcinoma of breast (HCC)    Liver cirrhosis secondary to NASH (HCC)    Lung nodule    Major depressive disorder in full remission 04/02/2020   Mood stable on venlafaxine  150 mg daily.     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 breast of female, estrogen receptor positive (HCC)    Migraine 08/29/2022   NAFLD (nonalcoholic fatty liver disease) 92/76/7985   Non-alcoholic cirrhosis (HCC)    Osteoarthritis of left knee 09/03/2020   Peritoneal hematoma 07/07/2019   Sprain of MCL (medial collateral ligament) of knee 09/03/2020   Symptom associated with female genital organs 07/09/2010   Past Surgical History:  Procedure Laterality Date   ABDOMINAL HYSTERECTOMY     AXILLARY SENTINEL NODE BIOPSY Left 04/22/2023   Procedure: AXILLARY SENTINEL NODE BIOPSY;  Surgeon: Lane Shope, MD;  Location: [facility];  Service: General;  Laterality: Left;   BREAST BIOPSY Left 03/31/2023   Us  Core bx, coil clip - path pending   BREAST BIOPSY Left 03/31/2023   US  LT BREAST BX  W LOC DEV 1ST LESION IMG BX SPEC US  GUIDE 03/31/2023 [facility]   BREAST BIOPSY Left 04/20/2023   US  LT RADIO FREQUENCY TAG LOC US  GUIDE 04/20/2023 [facility]   BREAST LUMPECTOMY WITH RADIOFREQUENCY TAG IDENTIFICATION Left 04/22/2023   Procedure: BREAST LUMPECTOMY WITH RADIOFREQUENCY TAG IDENTIFICATION;  Surgeon: Lane Shope, MD;  Location: [facility];  Service: General;  Laterality: Left;   CARDIAC CATHETERIZATION  03/10/2012   Negative   CATARACT EXTRACTION Bilateral    CESAREAN SECTION     CHOLECYSTECTOMY     RE-EXCISION OF BREAST LUMPECTOMY Left 05/20/2023   Procedure: EXCISION, LESION, BREAST, REPEAT;  Surgeon: Lane Shope, MD;  Location: [facility];  Service: General;  Laterality: Left;   SPLENECTOMY     TONSILLECTOMY     Patient Active Problem List   Diagnosis Date Noted   Dizziness  11/29/2023   Cerebrovascular small vessel disease 11/25/2023   Chronic nonintractable headache 09/08/2023   Osteopenia 05/27/2023   Genetic testing 05/15/2023   Change in facial mole 04/17/2023   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 breast of female, estrogen receptor positive (HCC) 04/09/2023   Invasive carcinoma of breast (HCC) 04/08/2023   Family history of breast cancer 04/08/2023   Gastroesophageal reflux disease 03/02/2023   Liver cirrhosis secondary to NASH (HCC) 03/02/2023   Bronchiectasis (HCC) 06/09/2013   Type 2 diabetes mellitus with diabetic neuropathy (HCC) 06/09/2013   HTN (hypertension), benign 06/09/2013    ONSET DATE: 10/09/23  REFERRING DIAG: R29.6 (ICD-10-CM) - Frequent falls   THERAPY DIAG:  Abnormal posture  Difficulty in walking, not elsewhere classified  Imbalance  Muscle weakness (generalized)  Rationale for Evaluation and Treatment: Rehabilitation  SUBJECTIVE:                                                                                                                                                                                             SUBJECTIVE STATEMENT:  Pt reports eating breakfast this morning before taking medications. Pt reports no dizziness but reports feeling slightly off kilter earlier this morning. Pt reports increasing her dose of gabapentin  which has decreased her neuropathy and pain in her feet. Pt reports minor pain in lumbar spine following completion of all functional measures.  PERTINENT HISTORY:  Pt reports she has had PT in [place] before for her neck and after her L TKA.  Pt reports that she has been having neck pain and headaches to the point where she will be having an MRI tomorrow.  Pt reports having 2 falls (August 2nd, August 6th).  Pt notes that the first fall she was  trying to get out of the recliner and she fell forward.  Pt reports on the second fall, she missed a  step. Pt reports she feels like she has POTS, and that it runs in her family as well (granddaughter and grandson).   Pt had gabapentin  increased from 1 time in the morning and 1 time at night, to 2 at each time frame, and then recently switched to 3 in the morning and 3 at night. Pt reports she was told by MD to ambulate with a walker, however she has not been doing that.  PMH: asthma, CAD, DM2, heart murmur, L TKA, HTN, Depressive disorder,migraines  PAIN:  Are you having pain?  Minor L hip and back pain reported following  PRECAUTIONS: Pt has hx of breast cancer and finished radiation back in May.    WEIGHT BEARING RESTRICTIONS: No  FALLS: Has patient fallen in last 6 months? Yes. Number of falls 2  LIVING ENVIRONMENT: Lives with: mother lives with pt Lives in: Apartment Stairs: Elevator Has following equipment at home: Single point cane and Environmental Consultant - 2 wheeled  PLOF: Independent  PATIENT GOALS: Being less wobbly and feel more balances.  OBJECTIVE:  Note: Objective measures were completed at Evaluation unless otherwise noted.  SENSATION: WFL, how pt notes burning in the bottom of her feet.  Pt reports she has neuropathy in the feet.  ORTHOSTATICS:  Eval Supine - BP: 131/52  mmHG  Sitting - BP: 112/55 mmHg  Standing - BP: 96/53 mmHg  Standing +3 Min - BP: 133/70 mmHg    11/18/23  117/33mmHg 78bpm  supine  102/20mmHg 80bpm seated 0 118/56mmHg 84bpm seated x1 minute 87/60mmHg 84bpm standing x0 minutes 104/33mmHg 88bpm standing x1 minute 105/28mmHg 87bpm standing x2 minute 110/61mmHg 90bpm standing post [id number]   9/12:  Seated 121/63 HR 68bpm  Standing 0 min 120/60 HR79  Standing 1 min 127/69 HR 86  Following gait for 142ft 126/56 HR82     9/17:  Seated: 115/61 HR 76 Standing: 81/49 HR 79 mild lightheadedness Standing 1 min 101/60 HR 87  reduced s/s but still slightly present  9/24: Seated 132/64 HR 67  Standing : 116/61 HR 70   Standing 1 min: 131/63 HR  72  9/30:  -Seated: 119/54 HR 66 -Standing 0 min 110/58 HR 70 mild light headedness as well as tingling in leg -Standing 2 min: 121/65 HR 71   -Sitting:  124/52 HR 65  10/31:  -Orthostatic vitals  121/72mmHg  65bpm seated  78/62mmHg 72bpm 116/55mmHg 75bpm  01/11/2024:  Seated: 123/63 HR 70 Standing: 116/62 HR 81   01/18/24: Orthostatic VS assessment Seated: 142/81 HR 83 Standing: 129/58 HR 77 Pt asymptomatic.  FUNCTIONAL TESTS:  5 times sit to stand: 19.04 sec Timed up and go (TUG): 14.68 sec 6 minute walk test:  9/10: tolerates on 428ft over 109m26sec before legs before increasingly ataxic  9/12: gait training with use of 4WW; tolerated 68ft, for 4:32, no significant ataxia noted, and only mild pain in sacrum area.  10 meter walk test: 15.32 sec; 0.65 m/s Dynamic Gait Index   [facility] PT Assessment - 01/18/24 0001       Functional Gait  Assessment   Gait Level Surface Walks 20 ft in less than 5.5 sec, no assistive devices, good speed, no evidence for imbalance, normal gait pattern, deviates no more than 6 in outside of the 12 in walkway width.    Change in Gait Speed Able to smoothly change walking speed without  loss of balance or gait deviation. Deviate no more than 6 in outside of the 12 in walkway width.    Gait with Horizontal Head Turns Performs head turns smoothly with no change in gait. Deviates no more than 6 in outside 12 in walkway width    Gait with Vertical Head Turns Performs head turns with no change in gait. Deviates no more than 6 in outside 12 in walkway width.    Gait and Pivot Turn Pivot turns safely within 3 sec and stops quickly with no loss of balance.    Step Over Obstacle Is able to step over one shoe box (4.5 in total height) without changing gait speed. No evidence of imbalance.    Gait with Narrow Base of Support Ambulates less than 4 steps heel to toe or cannot perform without assistance.    Gait with Eyes Closed Walks 20 ft, no assistive devices,  good speed, no evidence of imbalance, normal gait pattern, deviates no more than 6 in outside 12 in walkway width. Ambulates 20 ft in less than 7 sec.    Ambulating Backwards Walks 20 ft, uses assistive device, slower speed, mild gait deviations, deviates 6-10 in outside 12 in walkway width.    Steps Alternating feet, no rail.    Total Score 25           PATIENT SURVEYS:  ABC Scale:  64% (9/10)                                                                                                                                 TREATMENT DATE: 01/18/24 01/18/24: Orthostatic VS assessment Seated: 142/81 HR 83 Standing: 129/58 HR 77 Pt asymptomatic.  10 Meter Walk Test: Patient instructed to walk 10 meters (32.8 ft) as quickly and as safely as possible at their normal speed x2 and at a fast speed x2. Time measured from 2 meter mark to 8 meter mark to accommodate ramp-up and ramp-down.  Normal speed 1: 0.566 m/s Normal speed 2: 0.568 m/s Average Normal speed: 0.567 m/s Fast speed 1: 0.817 m/s Fast speed 2: 0.793 m/s Average Fast speed: 0.805 m/s Cut off scores: <0.4 m/s = household Ambulator, 0.4-0.8 m/s = limited community Ambulator, >0.8 m/s = community Ambulator, >1.2 m/s = crossing a street, <1.0 = increased fall risk MCID 0.05 m/s (small), 0.13 m/s (moderate), 0.06 m/s (significant)  (ANPTA Core Set of Outcome Measures for Adults with Neurologic Conditions, 2018)   6 Min Walk Test:  Instructed patient to ambulate as quickly and as safely as possible for 6 minutes using LRAD. Patient was allowed to take standing rest breaks without stopping the test, but if the patient required a sitting rest break the clock would be stopped and the test would be over.  Results: 1228 feet (374.294 meters, Avg speed 1.51m/s) using no AD with CGA only.  Age Matched Norms: 64-69 yo M: 31 F: 68, 3-79 yo M: 70  F: 471, 57-89 yo M: 417 F: 392 MDC: 58.21 meters (190.98 feet) or 50 meters (ANPTA Core Set of  Outcome Measures for Adults with Neurologic Conditions, 2018)   Patient demonstrates decreased fall risk as noted by score of 25/30 on Functional Gait Assessment.   <22/30 = predictive of falls, <20/30 = fall in 6 months, <18/30 = predictive of falls in PD MCID: 5 points stroke population, 4 points geriatric population (ANPTA Core Set of Outcome Measures for Adults with Neurologic Conditions, 2018)   Activities-specific Balance Confidence Scale:  Score: 71.25 Increased risk of falls in community-dwelling, older adults <80% (79.89%)  0% = no confidence - 100% = complete confidence (ANPTA Core Set of Outcome Measures for Adults with Neurologic Conditions, 2018)   PT instructed pt in TUG: 7.26 sec (average of 3 trials; >13.5 sec indicates increased fall risk)   Pt performed 5 time sit<>stand (5xSTS): 12.74 sec (>15 sec indicates increased fall risk)    PATIENT EDUCATION: Education details: Recommend eating prior to taking meds in the morning. Person educated: Patient Education method: Explanation Education comprehension: verbalized understanding  HOME EXERCISE PROGRAM: Access Code: [email or web address] URL: [email or web address] Date: 12/08/2023 Prepared by: Massie Dollar  Exercises - Backward Walking with Counter Support  - 1 x daily - 5 x weekly - 3 sets - 10 reps - Seated Gaze Stabilization with Head Rotation  - 1 x daily - 5 x weekly - 3 sets - 10 reps - Seated Gaze Stabilization with Head Nod  - 1 x daily - 5 x weekly - 3 sets - 10 reps - Sit to Stand with Arms Crossed  - 1 x daily - 5 x weekly - 3 sets - 10 reps - Side Stepping with Counter Support  - 1 x daily - 5 x weekly - 3 sets - 10 reps  GOALS: Goals reviewed with patient? Yes  SHORT TERM GOALS: Target date: 12/03/2023   Pt will be independent with HEP in order to demonstrate increased ability to perform tasks related to occupation/hobbies. Baseline: to be given at subsequent visit Goal status: INITIAL  LONG  TERM GOALS: Target date: 01/28/2024  1.  Patient (> 18 years old) will complete five times sit to stand test in < 15 seconds indicating an increased LE strength and improved balance. Baseline: 19.04 sec 10/8: 12.89 sec  11/10: 12.74 sec Goal status: MET  2.  Pt will improve ABC by at least 13% in order to demonstrate clinically significant improvement in balance confidence.  Baseline: 64% (9/10)   10/8: 71.25%  01/18/24: 71.25% Goal status: in progress   3.  Pt will improve FGA by at least 3 points in order to demonstrate clinically significant improvement in balance and decreased risk for falls. Baseline: 12/16/23: 19 01/18/24: 25 Goal status: MET   4.  Patient will reduce timed up and go to <11 seconds to reduce fall risk and demonstrate improved transfer/gait ability. Baseline: 14.68 sec 10/8: 9.59 sec without AD. 11/10:  Avg 7.26 sec, slight dizzines during turns Goal status: MET  5.  Patient will increase 10 meter walk test to >1.67m/s as to improve gait speed for better community ambulation and to reduce fall risk. Baseline: 15.32 sec; 0.65 m/s 10/8: normal speed: 0.93 m/s. Average Fast speed: 1.1 m/s 11/10: avg normal speed: 0.565m/s; avg fast speed: 0.805 m/s Goal status: In progress  6.  Patient will increase six minute walk test distance to >1000 for progression to community ambulator and improve gait ability Baseline: 9/10:  tolerates on 449ft over 55m26sec before legs before increasingly ataxic  10/8: 942ft without UE support  11/10: 1265ft without UE support or AD Goal status: MET   ASSESSMENT:  CLINICAL IMPRESSION: Pt instructed in and performed a series of functional outcome measures in today's session to track continued progression of functional mobility and balance issues. Pt showed increased score on FGA from 19 to 25, showing she has a decreased risk of falls. FGA showed pt has most difficulty during turning and narrow BoS ambulation. Pt score on  significantly improved, with pt showing increased endurance and lack of sway during ambulation, meeting her goal of >1070ft, with pt ambulating 1235ft in today's session. Pt TUG, 5STS and scores also show decreased time to perform test, all indicating a lower fall risk for her age category. ABC scale continues to show a confidence score of <80%, indicating a higher risk for falls. Patient has currently met 4/6 long term goals. Patient's condition has the potential to improve in response to therapy. Maximum improvement is yet to be obtained. The anticipated improvement is attainable and reasonable in a generally predictable time.   OBJECTIVE IMPAIRMENTS: Abnormal gait, decreased balance, decreased mobility, difficulty walking, dizziness, and improper body mechanics.   ACTIVITY LIMITATIONS: lifting, sitting, standing, stairs, transfers, reach over head, and locomotion level  PARTICIPATION LIMITATIONS: meal prep, cleaning, laundry, driving, shopping, community activity, and yard work  PERSONAL FACTORS: Age, Time since onset of injury/illness/exacerbation, and 3+ comorbidities: asthma, CAD, DM2, heart murmur, L TKA, HTN, Depressive disorder,migraines are also affecting patient's functional outcome.   REHAB POTENTIAL: Good  CLINICAL DECISION MAKING: Evolving/moderate complexity  EVALUATION COMPLEXITY: Moderate  PLAN:  PT FREQUENCY: 2x/week  PT DURATION: 12 weeks  PLANNED INTERVENTIONS: 97750- Physical Performance Testing, 97110-Therapeutic exercises, 97530- Therapeutic activity, 97112- Neuromuscular re-education, 97535- Self Care, [id number]- Manual therapy, [phone]- Gait training, [phone]- Canalith repositioning, Balance training, and Stair training  PLAN FOR NEXT SESSION:   -work on turning and narrow BOS during ambulation  -continue to educate on eating before taking meds   Renna Helling, SPT  01/18/2024

## 2024-01-22 ENCOUNTER — Encounter: Payer: Self-pay | Admitting: Physical Therapy

## 2024-01-22 ENCOUNTER — Ambulatory Visit: Admitting: Physical Therapy

## 2024-01-22 DIAGNOSIS — R2689 Other abnormalities of gait and mobility: Secondary | ICD-10-CM

## 2024-01-22 DIAGNOSIS — M6281 Muscle weakness (generalized): Secondary | ICD-10-CM

## 2024-01-22 DIAGNOSIS — R293 Abnormal posture: Secondary | ICD-10-CM | POA: Diagnosis not present

## 2024-01-22 DIAGNOSIS — R262 Difficulty in walking, not elsewhere classified: Secondary | ICD-10-CM

## 2024-01-22 NOTE — Therapy (Signed)
 OUTPATIENT PHYSICAL THERAPY NEURO TREATMENT Patient Name: Bianca Rogers MRN: [id number] DOB:Jan 01, 1951, 73 y.o., female Today's Date: 01/22/2024  PCP: Vincente Shivers, NP  REFERRING PROVIDER: Lane Arthea BRAVO, MD  END OF SESSION:  PT End of Session - 01/22/24 1025     Visit Number 21    Number of Visits 25    Date for Recertification  01/28/24    PT Start Time 1023    PT Stop Time 1101    PT Time Calculation (min) 38 min    Equipment Utilized During Treatment Gait belt    Activity Tolerance Patient tolerated treatment well    Behavior During Therapy WFL for tasks assessed/performed              Past Medical History:  Diagnosis Date   Anal fissure 12/09/2016   Added automatically from request for surgery [id number]    Added automatically from request for surgery [id number]     Arthralgia of left knee 12/03/2020   Asthma 08/29/2022   Chronic/stable.    Follows with Dr. Elayne, pulmonology.   Using Flovent  and albuterol  inhaler as needed.     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     Heart murmur    History of total knee arthroplasty 08/21/2021   Hyperlipidemia 08/29/2022   Lab Results   Component Value Date    Cholesterol 197 01/28/2022      Lab Results   Component Value Date    HDL 56 01/28/2022      Lab Results   Component Value Date    LDL Calculated 115 01/28/2022      Lab Results   Component Value Date    Triglycerides 131 01/28/2022      Lab Results   Component Value Date    Chol/HDL Ratio 3.5 01/28/2022      Criteria used to determine recommendation include:    Hypertension    Invasive carcinoma of breast (HCC)    Liver  cirrhosis secondary to NASH (HCC)    Lung nodule    Major depressive disorder in full remission 04/02/2020   Mood stable on venlafaxine  150 mg daily.     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 breast of female, estrogen receptor positive (HCC)    Migraine 08/29/2022   NAFLD (nonalcoholic fatty liver disease) 92/76/7985   Non-alcoholic cirrhosis (HCC)    Osteoarthritis of left knee 09/03/2020   Peritoneal hematoma 07/07/2019   Sprain of MCL (medial collateral ligament) of knee 09/03/2020   Symptom associated with female genital organs 07/09/2010   Past Surgical History:  Procedure Laterality Date   ABDOMINAL HYSTERECTOMY     AXILLARY SENTINEL NODE BIOPSY Left 04/22/2023   Procedure: AXILLARY SENTINEL NODE BIOPSY;  Surgeon: Lane Shope, MD;  Location: [facility];  Service: General;  Laterality: Left;   BREAST BIOPSY Left 03/31/2023   Us  Core bx, coil clip - path pending   BREAST BIOPSY Left 03/31/2023   US  LT BREAST BX W LOC DEV 1ST LESION IMG BX SPEC US  GUIDE 03/31/2023 [facility]   BREAST BIOPSY Left 04/20/2023   US  LT RADIO FREQUENCY TAG LOC US  GUIDE 04/20/2023  [facility]   BREAST LUMPECTOMY WITH RADIOFREQUENCY TAG IDENTIFICATION Left 04/22/2023   Procedure: BREAST LUMPECTOMY WITH RADIOFREQUENCY TAG IDENTIFICATION;  Surgeon: Lane Shope, MD;  Location: [facility];  Service: General;  Laterality: Left;   CARDIAC CATHETERIZATION  03/10/2012   Negative   CATARACT EXTRACTION Bilateral    CESAREAN SECTION     CHOLECYSTECTOMY     RE-EXCISION OF BREAST LUMPECTOMY Left 05/20/2023   Procedure: EXCISION, LESION, BREAST, REPEAT;  Surgeon: Lane Shope, MD;  Location: [facility];  Service: General;  Laterality: Left;   SPLENECTOMY     TONSILLECTOMY     Patient Active Problem List   Diagnosis Date Noted   Dizziness 11/29/2023   Cerebrovascular small vessel disease 11/25/2023   Chronic nonintractable headache 09/08/2023   Osteopenia 05/27/2023   Genetic testing  05/15/2023   Change in facial mole 04/17/2023   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 breast of female, estrogen receptor positive (HCC) 04/09/2023   Invasive carcinoma of breast (HCC) 04/08/2023   Family history of breast cancer 04/08/2023   Gastroesophageal reflux disease 03/02/2023   Liver cirrhosis secondary to NASH (HCC) 03/02/2023   Bronchiectasis (HCC) 06/09/2013   Type 2 diabetes mellitus with diabetic neuropathy (HCC) 06/09/2013   HTN (hypertension), benign 06/09/2013    ONSET DATE: 10/09/23  REFERRING DIAG: R29.6 (ICD-10-CM) - Frequent falls   THERAPY DIAG:  Difficulty in walking, not elsewhere classified  Imbalance  Muscle weakness (generalized)  Abnormal posture  Rationale for Evaluation and Treatment: Rehabilitation  SUBJECTIVE:                                                                                                                                                                                             SUBJECTIVE STATEMENT: Pt reported feeling good, and that she has family in town today. Pt reported having yogurt and coffee this morning before medications.   PERTINENT HISTORY:  Pt reports she has had PT in [place] before for her neck and after her L TKA.  Pt reports that she has been having neck pain and headaches to the point where she will be having an MRI tomorrow.  Pt reports having 2 falls (August 2nd, August 6th).  Pt notes that the first fall she was trying to get out of the recliner and she fell forward.  Pt reports on the second fall, she missed a step. Pt reports she feels like she has POTS, and that it runs in her family as well (granddaughter and grandson).   Pt had gabapentin  increased from 1 time in the morning and 1 time at night,  to 2 at each time frame, and then recently switched to 3 in the morning and 3 at night. Pt reports she was told by MD to ambulate with a walker, however she has not been  doing that.  PMH: asthma, CAD, DM2, heart murmur, L TKA, HTN, Depressive disorder,migraines  PAIN:  Are you having pain?  Minor pain in L axillary region  PRECAUTIONS: Pt has hx of breast cancer and finished radiation back in May.    WEIGHT BEARING RESTRICTIONS: No  FALLS: Has patient fallen in last 6 months? Yes. Number of falls 2  LIVING ENVIRONMENT: Lives with: mother lives with pt Lives in: Apartment Stairs: Elevator Has following equipment at home: Single point cane and Environmental Consultant - 2 wheeled  PLOF: Independent  PATIENT GOALS: Being less wobbly and feel more balances.  OBJECTIVE:  Note: Objective measures were completed at Evaluation unless otherwise noted.  SENSATION: WFL, how pt notes burning in the bottom of her feet.  Pt reports she has neuropathy in the feet.  ORTHOSTATICS:  Eval Supine - BP: 131/52  mmHG  Sitting - BP: 112/55 mmHg  Standing - BP: 96/53 mmHg  Standing +3 Min - BP: 133/70 mmHg    11/18/23  117/72mmHg 78bpm  supine  102/46mmHg 80bpm seated 0 118/49mmHg 84bpm seated x1 minute 87/43mmHg 84bpm standing x0 minutes 104/63mmHg 88bpm standing x1 minute 105/25mmHg 87bpm standing x2 minute 110/45mmHg 90bpm standing post [id number]   9/12:  Seated 121/63 HR 68bpm  Standing 0 min 120/60 HR79  Standing 1 min 127/69 HR 86  Following gait for 163ft 126/56 HR82     9/17:  Seated: 115/61 HR 76 Standing: 81/49 HR 79 mild lightheadedness Standing 1 min 101/60 HR 87  reduced s/s but still slightly present  9/24: Seated 132/64 HR 67  Standing : 116/61 HR 70   Standing 1 min: 131/63 HR 72  9/30:  -Seated: 119/54 HR 66 -Standing 0 min 110/58 HR 70 mild light headedness as well as tingling in leg -Standing 2 min: 121/65 HR 71   -Sitting:  124/52 HR 65  10/31:  -Orthostatic vitals  121/59mmHg  65bpm seated  78/57mmHg 72bpm 116/52mmHg 75bpm  01/11/2024:  Seated: 123/63 HR 70 Standing: 116/62 HR 81   01/18/24: Orthostatic VS assessment Seated: 142/81  HR 83 Standing: 129/58 HR 77 Pt asymptomatic.  FUNCTIONAL TESTS:  5 times sit to stand: 19.04 sec Timed up and go (TUG): 14.68 sec 6 minute walk test:  9/10: tolerates on 466ft over 104m26sec before legs before increasingly ataxic  9/12: gait training with use of 4WW; tolerated 658ft, for 4:32, no significant ataxia noted, and only mild pain in sacrum area.  10 meter walk test: 15.32 sec; 0.65 m/s Dynamic Gait Index      PATIENT SURVEYS:  ABC Scale:  64% (9/10)  TREATMENT DATE: 01/22/24 01/22/24: Orthostatic VS assessment Seated: 113/66 HR: 70 Standing: 117/64 HR: 79 Pt asymptomatic.  -back and forth 70ft ambulation w/ pivot turn, L side 5 laps, R side 4 laps to acclimate patient to quick turns to decrease symptoms of dizziness -blue foam pad static stance w/ cone pick up and stacking from floor and onto opposite surface, x3 each side, min assist given occasionally  -narrow stance ambulation between half foam rollers and wooden plank pathway (3ft part), 4 laps  -in/out weaving ambulation pattern between wooden plank and half foam roller pathway (88ft apart)  CGA given throughout session for all activities, occasional min assist given.  PATIENT EDUCATION: Education details: Recommend eating prior to taking meds in the morning. Person educated: Patient Education method: Explanation Education comprehension: verbalized understanding  HOME EXERCISE PROGRAM: Access Code: [email or web address] URL: [email or web address] Date: 12/08/2023 Prepared by: Massie Dollar  Exercises - Backward Walking with Counter Support  - 1 x daily - 5 x weekly - 3 sets - 10 reps - Seated Gaze Stabilization with Head Rotation  - 1 x daily - 5 x weekly - 3 sets - 10 reps - Seated Gaze Stabilization with Head Nod  - 1 x daily - 5 x weekly - 3 sets - 10 reps - Sit to Stand  with Arms Crossed  - 1 x daily - 5 x weekly - 3 sets - 10 reps - Side Stepping with Counter Support  - 1 x daily - 5 x weekly - 3 sets - 10 reps  GOALS: Goals reviewed with patient? Yes  SHORT TERM GOALS: Target date: 12/03/2023   Pt will be independent with HEP in order to demonstrate increased ability to perform tasks related to occupation/hobbies. Baseline: to be given at subsequent visit Goal status: INITIAL  LONG TERM GOALS: Target date: 01/28/2024  1.  Patient (> 38 years old) will complete five times sit to stand test in < 15 seconds indicating an increased LE strength and improved balance. Baseline: 19.04 sec 10/8: 12.89 sec  11/10: 12.74 sec Goal status: MET  2.  Pt will improve ABC by at least 13% in order to demonstrate clinically significant improvement in balance confidence.  Baseline: 64% (9/10)   10/8: 71.25%  01/18/24: 71.25% Goal status: in progress   3.  Pt will improve FGA by at least 3 points in order to demonstrate clinically significant improvement in balance and decreased risk for falls. Baseline: 12/16/23: 19 01/18/24: 25 Goal status: MET   4.  Patient will reduce timed up and go to <11 seconds to reduce fall risk and demonstrate improved transfer/gait ability. Baseline: 14.68 sec 10/8: 9.59 sec without AD. 11/10:  Avg 7.26 sec, slight dizzines during turns Goal status: MET  5.  Patient will increase 10 meter walk test to >1.49m/s as to improve gait speed for better community ambulation and to reduce fall risk. Baseline: 15.32 sec; 0.65 m/s 10/8: normal speed: 0.93 m/s. Average Fast speed: 1.1 m/s 11/10: avg normal speed: 0.528m/s; avg fast speed: 0.805 m/s Goal status: In progress  6.  Patient will increase six minute walk test distance to >1000 for progression to community ambulator and improve gait ability Baseline: 9/10: tolerates on 467ft over 29m26sec before legs before increasingly ataxic  10/8: 967ft without UE support  11/10: 1213ft without  UE support or AD Goal status: MET   ASSESSMENT:  CLINICAL IMPRESSION: Pt reports to therapy with good motivation for today's activities. Pt negative for orthostatic symptoms in today's appointment. Pt  showed no dizziness or LOB during ambulation with pivot turns and was able to correct from spinning on unilateral LE to taking 3 steps to perform turn following verbal cues from PT, showing significant improvement from previous appts. Pt reported continued difficulty with picking up items off of the ground, and pt showed good stamina and ability to perform cone stacking exercise, correcting her form during squatting to increase depth of squat following verbal cues. Pt showed good ability to ambulate with NBOS during pathway walking activity, requiring only CGA and occasional min assist for posture corrections. Pt reports minor L hip and knee pain, ranking 2/10 on NPS. Pt will benefit from skilled to PT address strength, balance, coordination deficits to return to PLOF and improve overall QoL.   OBJECTIVE IMPAIRMENTS: Abnormal gait, decreased balance, decreased mobility, difficulty walking, dizziness, and improper body mechanics.   ACTIVITY LIMITATIONS: lifting, sitting, standing, stairs, transfers, reach over head, and locomotion level  PARTICIPATION LIMITATIONS: meal prep, cleaning, laundry, driving, shopping, community activity, and yard work  PERSONAL FACTORS: Age, Time since onset of injury/illness/exacerbation, and 3+ comorbidities: asthma, CAD, DM2, heart murmur, L TKA, HTN, Depressive disorder,migraines are also affecting patient's functional outcome.   REHAB POTENTIAL: Good  CLINICAL DECISION MAKING: Evolving/moderate complexity  EVALUATION COMPLEXITY: Moderate  PLAN:  PT FREQUENCY: 2x/week  PT DURATION: 12 weeks  PLANNED INTERVENTIONS: 97750- Physical Performance Testing, 97110-Therapeutic exercises, 97530- Therapeutic activity, 97112- Neuromuscular re-education, 97535- Self Care,  [id number]- Manual therapy, [phone]- Gait training, [id number]- Canalith repositioning, Balance training, and Stair training  PLAN FOR NEXT SESSION:   -work on turning and narrow BOS during ambulation  -continue to educate on eating before taking meds   Renna Helling, SPT  01/22/2024

## 2024-01-27 ENCOUNTER — Ambulatory Visit: Admitting: Physical Therapy

## 2024-01-27 DIAGNOSIS — M6281 Muscle weakness (generalized): Secondary | ICD-10-CM

## 2024-01-27 DIAGNOSIS — R293 Abnormal posture: Secondary | ICD-10-CM

## 2024-01-27 DIAGNOSIS — R2689 Other abnormalities of gait and mobility: Secondary | ICD-10-CM

## 2024-01-27 DIAGNOSIS — R262 Difficulty in walking, not elsewhere classified: Secondary | ICD-10-CM

## 2024-01-27 NOTE — Therapy (Signed)
 OUTPATIENT PHYSICAL THERAPY NEURO TREATMENT/ DISCHARGE PT Patient Name: Bianca Rogers MRN: [id number] DOB:1951/01/30, 73 y.o., female Today's Date: 01/27/2024  PCP: Vincente Shivers, NP  REFERRING PROVIDER: Lane Arthea BRAVO, MD  END OF SESSION:  PT End of Session - 01/27/24 0853     Visit Number 22    Number of Visits 25    Date for Recertification  01/28/24    PT Start Time 0848    PT Stop Time 0928    PT Time Calculation (min) 40 min    Equipment Utilized During Treatment Gait belt    Activity Tolerance Patient tolerated treatment well    Behavior During Therapy [facility] for tasks assessed/performed               Past Medical History:  Diagnosis Date   Anal fissure 12/09/2016   Added automatically from request for surgery [id number]    Added automatically from request for surgery [id number]     Arthralgia of left knee 12/03/2020   Asthma 08/29/2022   Chronic/stable.    Follows with Dr. Elayne, pulmonology.   Using Flovent  and albuterol  inhaler as needed.     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     Heart murmur    History of total knee arthroplasty 08/21/2021   Hyperlipidemia 08/29/2022   Lab Results   Component Value Date    Cholesterol 197 01/28/2022      Lab Results   Component Value Date    HDL 56 01/28/2022      Lab Results   Component Value Date    LDL Calculated 115 01/28/2022      Lab Results   Component Value Date    Triglycerides 131 01/28/2022      Lab Results   Component Value Date    Chol/HDL Ratio 3.5 01/28/2022      Criteria used to determine recommendation include:    Hypertension    Invasive carcinoma of breast  (HCC)    Liver cirrhosis secondary to NASH (HCC)    Lung nodule    Major depressive disorder in full remission 04/02/2020   Mood stable on venlafaxine  150 mg daily.     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 breast of female, estrogen receptor positive (HCC)    Migraine 08/29/2022   NAFLD (nonalcoholic fatty liver disease) 92/76/7985   Non-alcoholic cirrhosis (HCC)    Osteoarthritis of left knee 09/03/2020   Peritoneal hematoma 07/07/2019   Sprain of MCL (medial collateral ligament) of knee 09/03/2020   Symptom associated with female genital organs 07/09/2010   Past Surgical History:  Procedure Laterality Date   ABDOMINAL HYSTERECTOMY     AXILLARY SENTINEL NODE BIOPSY Left 04/22/2023   Procedure: AXILLARY SENTINEL NODE BIOPSY;  Surgeon: Lane Shope, MD;  Location: [facility] ORS;  Service: General;  Laterality: Left;   BREAST BIOPSY Left 03/31/2023   Us  Core bx, coil clip - path pending   BREAST BIOPSY Left 03/31/2023   US  LT BREAST BX W LOC DEV 1ST LESION IMG BX SPEC US  GUIDE 03/31/2023 [facility]   BREAST BIOPSY Left 04/20/2023   US  LT RADIO FREQUENCY TAG LOC  US  GUIDE 04/20/2023 [facility]   BREAST LUMPECTOMY WITH RADIOFREQUENCY TAG IDENTIFICATION Left 04/22/2023   Procedure: BREAST LUMPECTOMY WITH RADIOFREQUENCY TAG IDENTIFICATION;  Surgeon: Lane Shope, MD;  Location: [facility] ORS;  Service: General;  Laterality: Left;   CARDIAC CATHETERIZATION  03/10/2012   Negative   CATARACT EXTRACTION Bilateral    CESAREAN SECTION     CHOLECYSTECTOMY     RE-EXCISION OF BREAST LUMPECTOMY Left 05/20/2023   Procedure: EXCISION, LESION, BREAST, REPEAT;  Surgeon: Lane Shope, MD;  Location: [facility] ORS;  Service: General;  Laterality: Left;   SPLENECTOMY     TONSILLECTOMY     Patient Active Problem List   Diagnosis Date Noted   Dizziness 11/29/2023   Cerebrovascular small vessel disease 11/25/2023   Chronic nonintractable headache 09/08/2023   Osteopenia 05/27/2023    Genetic testing 05/15/2023   Change in facial mole 04/17/2023   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 breast of female, estrogen receptor positive (HCC) 04/09/2023   Invasive carcinoma of breast (HCC) 04/08/2023   Family history of breast cancer 04/08/2023   Gastroesophageal reflux disease 03/02/2023   Liver cirrhosis secondary to NASH (HCC) 03/02/2023   Bronchiectasis (HCC) 06/09/2013   Type 2 diabetes mellitus with diabetic neuropathy (HCC) 06/09/2013   HTN (hypertension), benign 06/09/2013    ONSET DATE: 10/09/23  REFERRING DIAG: R29.6 (ICD-10-CM) - Frequent falls   THERAPY DIAG:  Difficulty in walking, not elsewhere classified  Imbalance  Muscle weakness (generalized)  Abnormal posture  Rationale for Evaluation and Treatment: Rehabilitation  SUBJECTIVE:                                                                                                                                                                                             SUBJECTIVE STATEMENT: Pt reported feeling good, is happy with progress and with her maintenance of her current function. Pt okay with discharge this date.    PERTINENT HISTORY:  Pt reports she has had PT in [place] before for her neck and after her L TKA.  Pt reports that she has been having neck pain and headaches to the point where she will be having an MRI tomorrow.  Pt reports having 2 falls (August 2nd, August 6th).  Pt notes that the first fall she was trying to get out of the recliner and she fell forward.  Pt reports on the second fall, she missed a step. Pt reports she feels like she has POTS, and that it runs in her family as well (granddaughter and grandson).   Pt had gabapentin  increased from 1 time in the morning and  1 time at night, to 2 at each time frame, and then recently switched to 3 in the morning and 3 at night. Pt reports she was told by MD to ambulate with a walker, however she  has not been doing that.  PMH: asthma, CAD, DM2, heart murmur, L TKA, HTN, Depressive disorder,migraines  PAIN:  Are you having pain?  Minor pain in L axillary region  PRECAUTIONS: Pt has hx of breast cancer and finished radiation back in May.    WEIGHT BEARING RESTRICTIONS: No  FALLS: Has patient fallen in last 6 months? Yes. Number of falls 2  LIVING ENVIRONMENT: Lives with: mother lives with pt Lives in: Apartment Stairs: Elevator Has following equipment at home: Single point cane and Environmental Consultant - 2 wheeled  PLOF: Independent  PATIENT GOALS: Being less wobbly and feel more balances.  OBJECTIVE:  Note: Objective measures were completed at Evaluation unless otherwise noted.  SENSATION: WFL, how pt notes burning in the bottom of her feet.  Pt reports she has neuropathy in the feet.  ORTHOSTATICS:  Eval Supine - BP: 131/52  mmHG  Sitting - BP: 112/55 mmHg  Standing - BP: 96/53 mmHg  Standing +3 Min - BP: 133/70 mmHg    11/18/23  117/36mmHg 78bpm  supine  102/34mmHg 80bpm seated 0 118/66mmHg 84bpm seated x1 minute 87/51mmHg 84bpm standing x0 minutes 104/24mmHg 88bpm standing x1 minute 105/28mmHg 87bpm standing x2 minute 110/48mmHg 90bpm standing post 453ftAMB   9/12:  Seated 121/63 HR 68bpm  Standing 0 min 120/60 HR79  Standing 1 min 127/69 HR 86  Following gait for 128ft 126/56 HR82     9/17:  Seated: 115/61 HR 76 Standing: 81/49 HR 79 mild lightheadedness Standing 1 min 101/60 HR 87  reduced s/s but still slightly present  9/24: Seated 132/64 HR 67  Standing : 116/61 HR 70   Standing 1 min: 131/63 HR 72  9/30:  -Seated: 119/54 HR 66 -Standing 0 min 110/58 HR 70 mild light headedness as well as tingling in leg -Standing 2 min: 121/65 HR 71   -Sitting:  124/52 HR 65  10/31:  -Orthostatic vitals  121/81mmHg  65bpm seated  78/71mmHg 72bpm 116/10mmHg 75bpm  01/11/2024:  Seated: 123/63 HR 70 Standing: 116/62 HR 81   01/18/24: Orthostatic VS  assessment Seated: 142/81 HR 83 Standing: 129/58 HR 77 Pt asymptomatic.  FUNCTIONAL TESTS:  5 times sit to stand: 19.04 sec Timed up and go (TUG): 14.68 sec 6 minute walk test:  9/10: tolerates on 419ft over 69m26sec before legs before increasingly ataxic  9/12: gait training with use of 4WW; tolerated 637ft, for 4:32, no significant ataxia noted, and only mild pain in sacrum area.  10 meter walk test: 15.32 sec; 0.65 m/s Dynamic Gait Index      PATIENT SURVEYS:  ABC Scale:  64% (9/10)  TREATMENT DATE: 01/27/24  TA- To improve functional movements patterns for everyday tasks   Nustep level 2-4 progressing every 2 min up resistance x 8 min total   Ball pickup activity at various heights x 12 rounds - no LOB, good safety awareness   NMR: To facilitate reeducation of movement, balance, posture, coordination, and/or proprioception/kinesthetic sense.  Standing VOR with UE support x 15 vertical and horizontal with handout provided for home use   Start on airex pad, walk across airex beam then onto another airex then turn around and repeat 2 x 5 laps   -*1 lap = 1 time each way to return to start point  Walk across narrow width surface between obstacles x 5 laps      PATIENT EDUCATION: Education details: Recommend eating prior to taking meds in the morning. Person educated: Patient Education method: Explanation Education comprehension: verbalized understanding  HOME EXERCISE PROGRAM: Access Code: [email or web address] URL: [email or web address] Date: 01/27/2024 Prepared by: Lonni Gainer  Exercises - Backward Walking with Counter Support  - 1 x daily - 5 x weekly - 3 sets - 10 reps - Sit to Stand with Arms Crossed  - 1 x daily - 5 x weekly - 3 sets - 10 reps - Side Stepping with Counter Support  - 1 x daily - 5 x weekly - 3 sets - 10  reps - Standing Gaze Stability (VOR) x 1 with Distant Target with Horizontal Head Turns  - 1 x daily - 7 x weekly - 3 sets - 10 reps - Standing Gaze Stability (VOR) x 1 With Distant Target With Vertical Head Turns  - 1 x daily - 7 x weekly - 2 sets - 10 reps  GOALS: Goals reviewed with patient? Yes  SHORT TERM GOALS: Target date: 12/03/2023   Pt will be independent with HEP in order to demonstrate increased ability to perform tasks related to occupation/hobbies. Baseline: to be given at subsequent visit Goal status: INITIAL  LONG TERM GOALS: Target date: 01/28/2024  1.  Patient (> 42 years old) will complete five times sit to stand test in < 15 seconds indicating an increased LE strength and improved balance. Baseline: 19.04 sec 10/8: 12.89 sec  11/10: 12.74 sec Goal status: MET  2.  Pt will improve ABC by at least 13% in order to demonstrate clinically significant improvement in balance confidence.  Baseline: 64% (9/10)   10/8: 71.25%  01/18/24: 71.25% Goal status: in progress   3.  Pt will improve FGA by at least 3 points in order to demonstrate clinically significant improvement in balance and decreased risk for falls. Baseline: 12/16/23: 19 01/18/24: 25 Goal status: MET   4.  Patient will reduce timed up and go to <11 seconds to reduce fall risk and demonstrate improved transfer/gait ability. Baseline: 14.68 sec 10/8: 9.59 sec without AD. 11/10:  Avg 7.26 sec, slight dizzines during turns Goal status: MET  5.  Patient will increase 10 meter walk test to >1.80m/s as to improve gait speed for better community ambulation and to reduce fall risk. Baseline: 15.32 sec; 0.65 m/s 10/8: normal speed: 0.93 m/s. Average Fast speed: 1.1 m/s 11/10: avg normal speed: 0.523m/s; avg fast speed: 0.805 m/s Goal status: In progress  6.  Patient will increase six minute walk test distance to >1000 for progression to community ambulator and improve gait ability Baseline: 9/10: tolerates on  471ft over 34m26sec before legs before increasingly ataxic  10/8: 956ft without UE support  11/10: 1268ft without UE support  or AD Goal status: MET   ASSESSMENT:  CLINICAL IMPRESSION:  Pt presents for discharge PT this date. Pt has met or nearly met all PT goals and is happy with progress and current level of function. Pt to be discharged with advanced and relevant HEP in place with all questions answered and all needs met. Pt now at decreased risk for falls based on functional testing and has improved safety awareness with activities.    OBJECTIVE IMPAIRMENTS: Abnormal gait, decreased balance, decreased mobility, difficulty walking, dizziness, and improper body mechanics.   ACTIVITY LIMITATIONS: lifting, sitting, standing, stairs, transfers, reach over head, and locomotion level  PARTICIPATION LIMITATIONS: meal prep, cleaning, laundry, driving, shopping, community activity, and yard work  PERSONAL FACTORS: Age, Time since onset of injury/illness/exacerbation, and 3+ comorbidities: asthma, CAD, DM2, heart murmur, L TKA, HTN, Depressive disorder,migraines are also affecting patient's functional outcome.   REHAB POTENTIAL: Good  CLINICAL DECISION MAKING: Evolving/moderate complexity  EVALUATION COMPLEXITY: Moderate  PLAN:  PT FREQUENCY: 2x/week  PT DURATION: 12 weeks  PLANNED INTERVENTIONS: 97750- Physical Performance Testing, 97110-Therapeutic exercises, 97530- Therapeutic activity, 97112- Neuromuscular re-education, 97535- Self Care, 02859- Manual therapy, [phone]- Gait training, [phone]- Canalith repositioning, Balance training, and Stair training  PLAN FOR NEXT SESSION:   D/c today    Note: Portions of this document were prepared using Dragon voice recognition software and although reviewed may contain unintentional dictation errors in syntax, grammar, or spelling.  Lonni KATHEE Gainer PT ,DPT Physical Therapist- [place]  [facility]   01/27/2024

## 2024-01-29 ENCOUNTER — Ambulatory Visit: Admitting: Physical Therapy

## 2024-02-02 ENCOUNTER — Ambulatory Visit: Admitting: Physical Therapy

## 2024-02-03 ENCOUNTER — Other Ambulatory Visit: Payer: Self-pay | Admitting: Oncology

## 2024-02-03 ENCOUNTER — Ambulatory Visit
Admission: RE | Admit: 2024-02-03 | Discharge: 2024-02-03 | Disposition: A | Source: Ambulatory Visit | Attending: Oncology

## 2024-02-03 ENCOUNTER — Ambulatory Visit
Admission: RE | Admit: 2024-02-03 | Discharge: 2024-02-03 | Disposition: A | Discharge status: Home / Self Care | Source: Ambulatory Visit | Attending: Oncology | Admitting: Oncology

## 2024-02-03 DIAGNOSIS — C50919 Malignant neoplasm of unspecified site of unspecified female breast: Secondary | ICD-10-CM | POA: Diagnosis present

## 2024-02-03 DIAGNOSIS — Z79810 Long term (current) use of selective estrogen receptor modulators (SERMs): Secondary | ICD-10-CM | POA: Diagnosis not present

## 2024-02-03 DIAGNOSIS — C50812 Malignant neoplasm of overlapping sites of left female breast: Secondary | ICD-10-CM | POA: Diagnosis not present

## 2024-02-03 DIAGNOSIS — Z9889 Other specified postprocedural states: Secondary | ICD-10-CM | POA: Insufficient documentation

## 2024-02-03 DIAGNOSIS — N6342 Unspecified lump in left breast, subareolar: Secondary | ICD-10-CM | POA: Insufficient documentation

## 2024-02-03 DIAGNOSIS — Z923 Personal history of irradiation: Secondary | ICD-10-CM | POA: Diagnosis not present

## 2024-02-03 DIAGNOSIS — N63 Unspecified lump in unspecified breast: Secondary | ICD-10-CM

## 2024-02-03 DIAGNOSIS — R928 Other abnormal and inconclusive findings on diagnostic imaging of breast: Secondary | ICD-10-CM

## 2024-02-08 ENCOUNTER — Inpatient Hospital Stay: Attending: Oncology | Admitting: *Deleted

## 2024-02-08 DIAGNOSIS — C50512 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 female breast: Secondary | ICD-10-CM

## 2024-02-08 DIAGNOSIS — Z9189 Other specified personal risk factors, not elsewhere classified: Secondary | ICD-10-CM

## 2024-02-08 NOTE — Progress Notes (Signed)
 SUBJECTIVE: Pt returns for her 3 month L-Dex screen.    PAIN:  Are you having pain? No   SOZO SCREENING: Patient was assessed today using the SOZO machine to determine the lymphedema index score. This was compared to her baseline score. It was determined that she is within the recommended range when compared to her baseline and no further action is needed at this time. She will continue SOZO screenings. These are done every 3 months for 2 years post operatively followed by every 6 months for 2 years, and then annually.     L-DEX FLOWSHEETS                L-DEX LYMPHEDEMA SCREENING    Measurement Type Unilateral     L-DEX MEASUREMENT EXTREMITY Upper Extremity     POSITION  Standing     DOMINANT SIDE Right     At Risk Side Left     BASELINE SCORE (UNILATERAL) -0.7    L-DEX SCORE (UNILATERAL) -2.7    VALUE CHANGE (UNILAT)

## 2024-02-09 ENCOUNTER — Ambulatory Visit: Admitting: Physical Therapy

## 2024-02-10 ENCOUNTER — Ambulatory Visit
Admission: RE | Admit: 2024-02-10 | Discharge: 2024-02-10 | Attending: Radiation Oncology | Admitting: Radiation Oncology

## 2024-02-10 ENCOUNTER — Encounter: Payer: Self-pay | Admitting: Radiation Oncology

## 2024-02-10 VITALS — BP 133/83 | HR 69 | Temp 98.3°F | Resp 16 | Wt 145.0 lb

## 2024-02-10 DIAGNOSIS — C50512 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 female breast: Secondary | ICD-10-CM

## 2024-02-10 NOTE — Progress Notes (Signed)
 Radiation Oncology Follow up Note  Name: Bianca Rogers   Date:   02/10/2024 MRN:  [id number] DOB: 05-09-1950    This 73 y.o. female presents to the clinic today for 89-month follow-up status post whole breast radiation to her left breast for stage Ia ER positive invasive mammary carcinoma.  REFERRING PROVIDER: Vincente Shivers, NP  HPI: Patient is a 73 year old female now out 7 months having completed whole breast radiation to her left breast for ER positive invasive mammary carcinoma.  Seen today in routine follow-up she is doing well specifically denies breast tenderness cough or bone pain.SABRA  Recent mammogram and ultrasound of her left breast that showed numerous irregular hypoechoic masses versus dilated ducts with debris in the inferior periareolar lower left breast.  She is scheduled for ultrasound-guided biopsies of these lesions.  On my review these are most likely dilated ducts and not malignancy.  Patient is current on tamoxifen  tolerating that well without side effect.  COMPLICATIONS OF TREATMENT: none  FOLLOW UP COMPLIANCE: keeps appointments   PHYSICAL EXAM:  BP 133/83   Pulse 69   Temp 98.3 F (36.8 C) (Tympanic)   Resp 16   Wt 145 lb (65.8 kg)   BMI 24.89 kg/m  Lungs are clear to A&P cardiac examination essentially unremarkable with regular rate and rhythm. No dominant mass or nodularity is noted in either breast in 2 positions examined. Incision is well-healed. No axillary or supraclavicular adenopathy is appreciated. Cosmetic result is excellent.  Well-developed well-nourished patient in NAD. HEENT reveals PERLA, EOMI, discs not visualized.  Oral cavity is clear. No oral mucosal lesions are identified. Neck is clear without evidence of cervical or supraclavicular adenopathy. Lungs are clear to A&P. Cardiac examination is essentially unremarkable with regular rate and rhythm without murmur rub or thrill. Abdomen is benign with no organomegaly or masses noted. Motor sensory and  DTR levels are equal and symmetric in the upper and lower extremities. Cranial nerves II through XII are grossly intact. Proprioception is intact. No peripheral adenopathy or edema is identified. No motor or sensory levels are noted. Crude visual fields are within normal range.  RADIOLOGY RESULTS: Mammogram and ultrasound reviewed compatible with above-stated findings  PLAN: I believe the findings on upon biopsy will be negative.  This would be very unusual pattern for a small invasive mammary carcinoma status post radiation.  I will follow her pathology reports results after completion of her biopsy.  Otherwise of asked to see her back in 6 months for follow-up.  Patient continues on tamoxifen  without side effect.  Patient knows to call with any concerns.  I would like to take this opportunity to thank you for allowing me to participate in the care of your patient.SABRA Bianca Penton, MD

## 2024-02-11 ENCOUNTER — Ambulatory Visit
Admission: RE | Admit: 2024-02-11 | Discharge: 2024-02-11 | Disposition: A | Discharge status: Home / Self Care | Source: Ambulatory Visit | Attending: Oncology | Admitting: Oncology

## 2024-02-11 ENCOUNTER — Inpatient Hospital Stay: Admission: RE | Admit: 2024-02-11 | Discharge: 2024-02-11 | Attending: Oncology

## 2024-02-11 ENCOUNTER — Ambulatory Visit: Admitting: Physical Therapy

## 2024-02-11 ENCOUNTER — Other Ambulatory Visit: Payer: Self-pay

## 2024-02-11 DIAGNOSIS — N63 Unspecified lump in unspecified breast: Secondary | ICD-10-CM | POA: Insufficient documentation

## 2024-02-11 DIAGNOSIS — R928 Other abnormal and inconclusive findings on diagnostic imaging of breast: Secondary | ICD-10-CM | POA: Insufficient documentation

## 2024-02-11 HISTORY — PX: BREAST BIOPSY: SHX20

## 2024-02-11 MED ORDER — LIDOCAINE 1 % OPTIME INJ - NO CHARGE
2.0000 mL | Freq: Once | INTRAMUSCULAR | Status: AC
  Administered 2024-02-11: 2 mL via INTRADERMAL
  Filled 2024-02-11: qty 2

## 2024-02-11 MED ORDER — LIDOCAINE-EPINEPHRINE 1 %-1:100000 IJ SOLN
10.0000 mL | Freq: Once | INTRAMUSCULAR | Status: AC
  Administered 2024-02-11: 10 mL via INTRADERMAL
  Filled 2024-02-11: qty 10

## 2024-02-12 ENCOUNTER — Encounter: Payer: Self-pay | Admitting: *Deleted

## 2024-02-12 LAB — SURGICAL PATHOLOGY

## 2024-02-16 ENCOUNTER — Ambulatory Visit: Admitting: Physical Therapy

## 2024-02-18 ENCOUNTER — Ambulatory Visit: Admitting: Physical Therapy

## 2024-02-24 ENCOUNTER — Encounter: Payer: Self-pay | Admitting: General Practice

## 2024-02-24 ENCOUNTER — Ambulatory Visit: Admitting: General Practice

## 2024-02-24 VITALS — BP 122/60 | HR 68 | Temp 97.1°F | Ht 64.0 in | Wt 146.0 lb

## 2024-02-24 DIAGNOSIS — K7581 Nonalcoholic steatohepatitis (NASH): Secondary | ICD-10-CM

## 2024-02-24 DIAGNOSIS — I679 Cerebrovascular disease, unspecified: Secondary | ICD-10-CM

## 2024-02-24 DIAGNOSIS — K219 Gastro-esophageal reflux disease without esophagitis: Secondary | ICD-10-CM

## 2024-02-24 DIAGNOSIS — F339 Major depressive disorder, recurrent, unspecified: Secondary | ICD-10-CM

## 2024-02-24 DIAGNOSIS — E785 Hyperlipidemia, unspecified: Secondary | ICD-10-CM

## 2024-02-24 DIAGNOSIS — E1169 Type 2 diabetes mellitus with other specified complication: Secondary | ICD-10-CM | POA: Diagnosis not present

## 2024-02-24 DIAGNOSIS — E1140 Type 2 diabetes mellitus with diabetic neuropathy, unspecified: Secondary | ICD-10-CM | POA: Diagnosis not present

## 2024-02-24 DIAGNOSIS — M160 Bilateral primary osteoarthritis of hip: Secondary | ICD-10-CM | POA: Diagnosis not present

## 2024-02-24 DIAGNOSIS — I10 Essential (primary) hypertension: Secondary | ICD-10-CM | POA: Diagnosis not present

## 2024-02-24 DIAGNOSIS — J479 Bronchiectasis, uncomplicated: Secondary | ICD-10-CM

## 2024-02-24 DIAGNOSIS — C50919 Malignant neoplasm of unspecified site of unspecified female breast: Secondary | ICD-10-CM

## 2024-02-24 DIAGNOSIS — K7469 Other cirrhosis of liver: Secondary | ICD-10-CM

## 2024-02-24 LAB — COMPREHENSIVE METABOLIC PANEL WITH GFR
ALT: 24 U/L (ref 3–35)
AST: 28 U/L (ref 5–37)
Albumin: 4 g/dL (ref 3.5–5.2)
Alkaline Phosphatase: 88 U/L (ref 39–117)
BUN: 23 mg/dL (ref 6–23)
CO2: 31 meq/L (ref 19–32)
Calcium: 9.8 mg/dL (ref 8.4–10.5)
Chloride: 102 meq/L (ref 96–112)
Creatinine, Ser: 0.96 mg/dL (ref 0.40–1.20)
GFR: 58.39 mL/min — ABNORMAL LOW (ref >60.00)
Glucose, Bld: 82 mg/dL (ref 70–99)
Potassium: 4.9 meq/L (ref 3.5–5.1)
Sodium: 139 meq/L (ref 135–145)
Total Bilirubin: 0.3 mg/dL (ref 0.2–1.2)
Total Protein: 6.6 g/dL (ref 6.0–8.3)

## 2024-02-24 LAB — CBC
HCT: 35 % — ABNORMAL LOW (ref 36.0–46.0)
Hemoglobin: 11.6 g/dL — ABNORMAL LOW (ref 12.0–15.0)
MCHC: 33.1 g/dL (ref 30.0–36.0)
MCV: 89 fl (ref 78.0–100.0)
Platelets: 315 K/uL (ref 150.0–400.0)
RBC: 3.93 Mil/uL (ref 3.87–5.11)
RDW: 13.5 % (ref 11.5–15.5)
WBC: 8 K/uL (ref 4.0–10.5)

## 2024-02-24 LAB — HEMOGLOBIN A1C: Hgb A1c MFr Bld: 6.1 % (ref 4.6–6.5)

## 2024-02-24 LAB — TSH: TSH: 6.99 u[IU]/mL — ABNORMAL HIGH (ref 0.35–5.50)

## 2024-02-24 MED ORDER — MELOXICAM 7.5 MG PO TABS: 7.5000 mg | ORAL_TABLET | Freq: Every day | ORAL | 0 refills | Status: DC

## 2024-02-24 NOTE — Progress Notes (Signed)
 Established Patient Office Visit  Subjective   Patient ID: Bianca Rogers, female    DOB: 17-Sep-1950  Age: 73 y.o. MRN: [id number]  Chief Complaint  Patient presents with   chronic care management    Patient following up on chronic conditions. Patient states she had another beast biopsy since coming in last.     HPI  Discussed the use of AI scribe software for clinical note transcription with the patient, who gave verbal consent to proceed.  History of Present Illness Bianca Rogers is a 73 year old female who presents for chronic care management.  She manages her hypertension with lisinopril  20 mg once daily, achieving improved blood pressure readings in the 120s.   For hyperlipidemia, she takes Zetia  and pravastatin , along with a daily baby aspirin .  Her type 2 diabetes is managed with metformin , two tablets twice a day. Blood glucose levels have been in the 80s; two weeks ago, she felt unwell and monitored her levels every two hours for a week. She experiences neuropathy, primarily in her feet, worsening at night and now affecting her during the day. She takes gabapentin  100 mg, three capsules in the morning and three at night, occasionally taking one in the afternoon if needed. No recent episodes of blood glucose dropping below 70.  She has a history of breast cancer and recently underwent two benign biopsies. She has multiple masses and will have a follow-up mammogram in six months.  Her osteoarthritis, particularly in her hips, causes pain. She recently completed physical therapy for balance issues but still experiences imbalance. Hip x-ray shows osteoarthritis.   She is under the care of a pulmonologist for respiratory issues, using Vaxelis twice a day and albuterol  as needed. She also takes Claritin  daily for allergies.  Her gastroesophageal reflux disease (GERD) is managed with omeprazole  20 mg once daily, which she tolerates well.    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 breast of female, estrogen receptor positive (HCC) 04/09/2023   Invasive carcinoma of breast (HCC) 04/08/2023   Family history of breast cancer 04/08/2023   Gastroesophageal reflux disease 03/02/2023   Liver cirrhosis secondary to NASH (HCC) 03/02/2023   Bronchiectasis (HCC) 06/09/2013   Type 2 diabetes mellitus with diabetic neuropathy (HCC) 06/09/2013   HTN (hypertension), benign 06/09/2013   Past Medical History:  Diagnosis Date   Anal fissure 12/09/2016   Added automatically from request for surgery [id number]    Added automatically from request for surgery [id number]     Arthralgia of left knee 12/03/2020   Asthma 08/29/2022   Chronic/stable.    Follows with Dr. Elayne, pulmonology.   Using Flovent  and albuterol  inhaler as needed.     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     Heart murmur    History of total knee arthroplasty 08/21/2021   Hyperlipidemia  08/29/2022   Lab Results   Component Value Date    Cholesterol 197 01/28/2022      Lab Results   Component Value Date    HDL 56 01/28/2022      Lab Results   Component Value Date    LDL Calculated 115 01/28/2022      Lab Results   Component Value Date    Triglycerides 131 01/28/2022      Lab Results   Component Value Date    Chol/HDL Ratio 3.5 01/28/2022      Criteria used to  determine recommendation include:    Hypertension    Invasive carcinoma of breast (HCC)    Liver cirrhosis secondary to NASH (HCC)    Lung nodule    Major depressive disorder in full remission 04/02/2020   Mood stable on venlafaxine  150 mg daily.     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 breast of female, estrogen receptor positive (HCC)    Migraine 08/29/2022   NAFLD (nonalcoholic fatty liver disease) 92/76/7985   Non-alcoholic cirrhosis (HCC)    Osteoarthritis of left knee 09/03/2020   Peritoneal hematoma 07/07/2019   Sprain of MCL (medial collateral ligament) of knee 09/03/2020   Symptom associated with female genital organs 07/09/2010   Past Surgical History:  Procedure Laterality Date   ABDOMINAL HYSTERECTOMY     AXILLARY SENTINEL NODE BIOPSY Left 04/22/2023   Procedure: AXILLARY SENTINEL NODE BIOPSY;  Surgeon: Lane Shope, MD;  Location: [facility];  Service: General;  Laterality: Left;   BREAST BIOPSY Left 03/31/2023   US  LT BREAST BX W LOC DEV 1ST LESION IMG BX SPEC US  GUIDE 03/31/2023 [facility]   BREAST BIOPSY Left 04/20/2023   US  LT RADIO FREQUENCY TAG LOC US  GUIDE 04/20/2023 [facility]   BREAST BIOPSY Left 02/11/2024   US  LT BREAST BX W LOC DEV 1ST LESION IMG BX SPEC US  GUIDE 02/11/2024 [facility]   BREAST BIOPSY Left 02/11/2024   US  LT BREAST BX W LOC DEV EA ADD LESION IMG BX SPEC US  GUIDE 02/11/2024 [facility]   BREAST LUMPECTOMY WITH RADIOFREQUENCY TAG IDENTIFICATION Left 04/22/2023   Procedure: BREAST LUMPECTOMY WITH RADIOFREQUENCY TAG IDENTIFICATION;  Surgeon: Lane Shope, MD;  Location: [facility];  Service: General;  Laterality: Left;   CARDIAC CATHETERIZATION  03/10/2012   Negative   CATARACT EXTRACTION Bilateral    CESAREAN SECTION     CHOLECYSTECTOMY     RE-EXCISION OF BREAST LUMPECTOMY Left 05/20/2023   Procedure: EXCISION, LESION, BREAST, REPEAT;  Surgeon: Lane Shope, MD;  Location: [facility];  Service: General;   Laterality: Left;   SPLENECTOMY     TONSILLECTOMY     Allergies[1]       02/10/2024   10:44 AM 01/12/2024   10:06 AM 11/25/2023   12:31 PM  Depression screen PHQ 2/9  Decreased Interest 0 0 1  Down, Depressed, Hopeless 0 0 1  PHQ - 2 Score 0 0 2  Altered sleeping   1  Tired, decreased energy   1  Change in appetite   1  Feeling bad or failure about yourself    0  Trouble concentrating   2  Moving slowly or fidgety/restless   1  Suicidal thoughts   0  PHQ-9 Score   8   Difficult doing work/chores   Somewhat difficult     Data saved with a previous flowsheet row definition       11/25/2023   12:31 PM 11/11/2023   11:07 AM 06/09/2023  12:02 PM 04/17/2023    2:51 PM  GAD 7 : Generalized Anxiety Score  Nervous, Anxious, on Edge 1 2 2 2   Control/stop worrying 1 1 1 1   Worry too much - different things 1 1 1 1   Trouble relaxing 1 1 1 1   Restless 0 0 1 1  Easily annoyed or irritable 2 3 1 2   Afraid - awful might happen 0 0 1 0  Total GAD 7 Score 6 8 8 8   Anxiety Difficulty Somewhat difficult Somewhat difficult Not difficult at all Not difficult at all      Review of Systems  Constitutional:  Negative for chills and fever.  Respiratory:  Negative for shortness of breath.   Cardiovascular:  Negative for chest pain.  Gastrointestinal:  Negative for abdominal pain, constipation, diarrhea, heartburn, nausea and vomiting.  Genitourinary:  Negative for dysuria, frequency and urgency.  Neurological:  Negative for dizziness and headaches.  Endo/Heme/Allergies:  Negative for polydipsia.  Psychiatric/Behavioral:  Negative for depression and suicidal ideas. The patient is not nervous/anxious.       Objective:     BP 122/60   Pulse 68   Temp (!) 97.1 F (36.2 C) (Temporal)   Ht 5' 4 (1.626 m)   Wt 146 lb (66.2 kg)   SpO2 98%   BMI 25.06 kg/m  BP Readings from Last 3 Encounters:  02/24/24 122/60  02/10/24 133/83  01/12/24 131/64   Wt Readings from Last 3 Encounters:   02/24/24 146 lb (66.2 kg)  02/10/24 145 lb (65.8 kg)  01/12/24 147 lb (66.7 kg)      Physical Exam Vitals and nursing note reviewed.  Constitutional:      Appearance: Normal appearance.  Cardiovascular:     Rate and Rhythm: Normal rate and regular rhythm.     Pulses: Normal pulses.     Heart sounds: Normal heart sounds.  Pulmonary:     Effort: Pulmonary effort is normal.     Breath sounds: Normal breath sounds.  Neurological:     Mental Status: She is alert and oriented to person, place, and time.  Psychiatric:        Mood and Affect: Mood normal.        Behavior: Behavior normal.        Thought Content: Thought content normal.        Judgment: Judgment normal.      No results found for any visits on 02/24/24.     The 10-year ASCVD risk score (Arnett DK, et al., 2019) is: 30.5%    Assessment & Plan:  Type 2 diabetes mellitus with diabetic neuropathy, unspecified whether long term insulin  use (HCC) -     CBC -     Comprehensive metabolic panel with GFR -     Hemoglobin A1c  HTN (hypertension), benign  Depression, recurrent  Primary osteoarthritis of both hips -     Meloxicam ; Take 1 tablet (7.5 mg total) by mouth daily.  Dispense: 30 tablet; Refill: 0  Hyperlipidemia associated with type 2 diabetes mellitus (HCC) -     TSH  Cerebrovascular small vessel disease  Bronchiectasis without complication (HCC)  Liver cirrhosis secondary to NASH (HCC)  Gastroesophageal reflux disease, unspecified whether esophagitis present  Invasive carcinoma of breast (HCC)    Assessment and Plan Assessment & Plan Type 2 diabetes mellitus with diabetic neuropathy Blood glucose controlled, neuropathy symptoms persist, gabapentin  dosage may be insufficient. - Check A1c today. - Continue metformin  1000 mg oral bid. -  CMP pending.   Hypertension Blood pressure controlled with lisinopril . - Continue lisinopril  20 mg oral daily. - Continue aspirin  as prescribed.  Breast  cancer, status post lumpectomy and radiation Recent biopsies benign, follow-up mammogram scheduled, oncology managing further imaging. - Follow up with oncology for mammogram scheduling.  Osteoarthritis of the hips Hip pain likely due to osteoarthritis, prefers medication over physical therapy. - Start meloxicam  daily for pain management. - Consider physical therapy if pain persists.  Liver cirrhosis secondary to NASH Following up with gastroenterology, pending endoscopy or colonoscopy. - Following with gastroenterology. - no concerns today.   Bronchiectasis Managed by pulmonology, using Vaxelis and albuterol , no current issues. - Continue Wixela twice a day and albuterol  as needed. - Follow up with pulmonology as scheduled.  Gastroesophageal reflux disease (GERD) GERD managed with omeprazole , no current concerns. - Continue omeprazole  20 mg oral daily.  General Health Maintenance Due for physical exam and tetanus booster, recent eye exam normal. - Schedule physical exam in four weeks, after the holidays. - Check with pharmacy regarding tetanus booster. - Ensure fasting for cholesterol check during physical exam.   Return in about 4 weeks (around 03/23/2024) for physical and labs.Bianca Carrol [place], NP     [1]  Allergies Allergen Reactions   Penicillins Rash    Other reaction(s): unknown reaction   Zoloft [Sertraline Hcl] Other (See Comments)    Paradoxical response   Codeine Other (See Comments) and Anxiety    Irritable, insomnia   Sertraline Anxiety

## 2024-02-24 NOTE — Patient Instructions (Addendum)
 Stop by the lab prior to leaving today. I will notify you of your results once received.    Start Meloxicam  7.5 mg once daily for pain.   Continue other medication as prescribed.   Schedule TDAP at pharmacy.   Continue monitoring BP and glucose at home.   Follow up in 4 weeks for physical and labs.   It was a pleasure to see you today!

## 2024-02-25 ENCOUNTER — Ambulatory Visit: Admitting: Physical Therapy

## 2024-02-25 ENCOUNTER — Ambulatory Visit: Payer: Self-pay | Admitting: General Practice

## 2024-03-01 ENCOUNTER — Ambulatory Visit: Admitting: Physical Therapy

## 2024-03-02 ENCOUNTER — Other Ambulatory Visit: Payer: Self-pay | Admitting: General Practice

## 2024-03-02 DIAGNOSIS — E1140 Type 2 diabetes mellitus with diabetic neuropathy, unspecified: Secondary | ICD-10-CM

## 2024-03-07 ENCOUNTER — Other Ambulatory Visit: Payer: Self-pay | Admitting: General Practice

## 2024-03-07 DIAGNOSIS — M160 Bilateral primary osteoarthritis of hip: Secondary | ICD-10-CM

## 2024-03-08 ENCOUNTER — Ambulatory Visit: Admitting: Physical Therapy

## 2024-03-11 ENCOUNTER — Ambulatory Visit: Admitting: Physical Therapy

## 2024-03-14 ENCOUNTER — Other Ambulatory Visit: Payer: Self-pay | Admitting: Nurse Practitioner

## 2024-03-14 ENCOUNTER — Ambulatory Visit
Admission: RE | Admit: 2024-03-14 | Discharge: 2024-03-14 | Disposition: A | Discharge status: Home / Self Care | Source: Ambulatory Visit | Attending: Nurse Practitioner | Admitting: Nurse Practitioner

## 2024-03-14 ENCOUNTER — Encounter: Payer: Self-pay | Admitting: Nurse Practitioner

## 2024-03-14 ENCOUNTER — Inpatient Hospital Stay: Attending: Oncology | Admitting: Nurse Practitioner

## 2024-03-14 ENCOUNTER — Encounter: Payer: Self-pay | Admitting: *Deleted

## 2024-03-14 VITALS — BP 158/65 | HR 63 | Temp 97.3°F | Ht 64.0 in | Wt 147.0 lb

## 2024-03-14 DIAGNOSIS — M546 Pain in thoracic spine: Secondary | ICD-10-CM

## 2024-03-14 DIAGNOSIS — R0789 Other chest pain: Secondary | ICD-10-CM

## 2024-03-14 DIAGNOSIS — M25511 Pain in right shoulder: Secondary | ICD-10-CM | POA: Diagnosis not present

## 2024-03-14 MED ORDER — PREDNISONE 20 MG PO TABS: 40.0000 mg | ORAL_TABLET | Freq: Every day | ORAL | 0 refills | Status: DC

## 2024-03-14 MED ORDER — METHOCARBAMOL 500 MG PO TABS: 500.0000 mg | ORAL_TABLET | Freq: Three times a day (TID) | ORAL | 0 refills | Status: AC | PRN

## 2024-03-14 NOTE — Progress Notes (Signed)
 Bianca Rogers called complaining of pain in her right breast and under her right arm for about 3 weeks.   This started after helping to lift her mother after her mother had a fall.   She is also having some left breast burning and stinging.   She will be seen in [facility] today.

## 2024-03-14 NOTE — Progress Notes (Signed)
 " Hematology/Oncology Progress note [phone] [phone]        REFERRING PROVIDER: Vincente Shivers, NP    CHIEF COMPLAINTS/PURPOSE OF CONSULTATION:  History of left breast invasive carcinoma presents with significant right shoulder, thoracic, right chest wall/rib pain    Orders Placed This Encounter  Procedures   DG Ribs Unilateral Right    Standing Status:   Future    Number of Occurrences:   1    Expected Date:   03/14/2024    Expiration Date:   03/14/2025    Reason for Exam (SYMPTOM  OR DIAGNOSIS REQUIRED):   Acute pain    Preferred imaging location?:   [facility]   AMB referral to orthopedics    Referral Priority:   Urgent    Referral Type:   Consultation    Referred to Provider:   [facility], [facility]    Number of Visits Requested:   1     HISTORY OF PRESENTING ILLNESS:  Bianca Rogers 74 y.o. female presents to symptom management clinic with concerns of acute right chest wall pain, right shoulder, thoracic spine radiating around to right ribs after helping pick her mother up off of floor post fall.  Patient reports that about 3 weeks ago her mother had a fall.  She assisted picking her mother up off of floor and since then her right side has been painful.  Today she appears to be uncomfortable with any movement of her right arm.  No bruising, swelling, or deformities noted, good cap refill and sensation in right hand.  She reports pain with palpation over thoracic spine.  Appears to have pain that radiates from thoracic spine and from right shoulder down around right ribs and chest wall.  Reports pain on that side with any movement and with deep breaths and coughing.  She does have a history of breast cancer of left breast.  She reports some stinging sensation of her left nipple.  No concerns felt on palpation of breast, no deformities or lumps felt that is concerning.  This appears to be musculoskeletal in nature.  She reports recently completing PT due to  bulging disc in her cervical spine.  She denies any neck pain or stiffness today.  No recent falls reported or changes in mobility.  I have reviewed her chart and materials related to her cancer extensively and collaborated history with the patient. Summary of oncologic history is as follows: Oncology History  Invasive carcinoma of breast (HCC)  03/26/2023 Mammogram   Diagnostic unilateral left breast mammogram showed 1. 0.8 cm suspicious mass within the LOWER OUTER LEFT breast, likely representing the enlarging mammographic finding. Tissue sampling is recommended. No abnormal appearing LEFT axillary lymph nodes.   04/08/2023 Initial Diagnosis   Invasive carcinoma of breast [facility])  Patient presented for left breast diagnostic mammogram for follow-up of left breast asymmetry from outside facility.  She denies any breast concerns.  Left breast 5:00 4 cm from nipple needle core biopsy showed - INVASIVE MAMMARY CARCINOMA, NO SPECIAL TYPE.       - TUBULE FORMATION: SCORE 3       - NUCLEAR PLEOMORPHISM: SCORE 3       - MITOTIC COUNT: SCORE 2       - TOTAL SCORE: 8       - OVERALL GRADE: 3       - LYMPHOVASCULAR INVASION: NOT IDENTIFIED       - CANCER LENGTH: 8 MM       -  CALCIFICATIONS: NOT IDENTIFIED       - DUCTAL CARCINOMA IN SITU: PRESENT, HIGH-GRADE   ER 100%, PR 95%, HER2 negative [0]   Menarche at age of 37 or 16. First live birth at age of 2 OCP use: Less than 5 years of use. History of hysterectomy: Yes with removal of 1 ovary. Menopausal status: Postmenopausal History of HRT use: Denies History of chest radiation: Denies Number of previous breast biopsies: Denies Family history positive for multiple family members with breast cancer.     04/08/2023 Cancer Staging   Staging form: Breast, AJCC 8th Edition - Clinical stage from 04/08/2023: Stage IA (cT1b, cN0, cM0, G3, ER+, PR+, HER2-) - Signed by Babara Call, MD on 04/08/2023 Stage prefix: Initial diagnosis Histologic grading  system: 3 grade system    Genetic Testing   Negative CancerNext-Expanded +RNAinsight panel. 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 The CancerNext-Expanded gene panel offered by [facility] and includes sequencing, rearrangement, and RNA analysis for the following 76 genes: AIP, ALK, APC, ATM, AXIN2, BAP1, BARD1, BMPR1A, BRCA1, BRCA2, BRIP1, CDC73, CDH1, CDK4, CDKN1B, CDKN2A, CEBPA, CHEK2, CTNNA1, DDX41, DICER1, ETV6, FH, FLCN, GATA2, LZTR1, MAX, MBD4, MEN1, MET, MLH1, MSH2, MSH3, MSH6, MUTYH, NF1, NF2, NTHL1, PALB2, PHOX2B, PMS2, POT1, PRKAR1A, PTCH1, PTEN, RAD51C, RAD51D, RB1, RET, RUNX1, SDHA, SDHAF2, SDHB, SDHC, SDHD, SMAD4, SMARCA4, SMARCB1, SMARCE1, STK11, SUFU, TMEM127, TP53, TSC1, TSC2, VHL, and WT1 (sequencing and deletion/duplication); EGFR, HOXB13, KIT, MITF, PDGFRA, POLD1, and POLE (sequencing only); EPCAM and GREM1 (deletion/duplication only). Report date 05/07/23.    04/13/2023 Imaging   MRI breast bilateral w wo contrast   1. Biopsy-proven 0.8 cm malignancy within the posterior LOWER LEFT breast. No other suspicious abnormalities noted within either breast although motion artifact slightly decreases sensitivity. 2. No abnormal appearing lymph nodes.      04/22/2023 Surgery   Patient underwent left breast lumpectomy and sentinel lymph node biopsy. Pathology showed 1. Lymph node, sentinel, biopsy, #1 :      ONE LYMPH NODE, NEGATIVE FOR METASTATIC CARCINOMA (0/1).       2. Breast, lumpectomy, Left inferior tissue :      INVASIVE DUCTAL CARCINOMA, 1.0 CM, GRADE 3      DUCTAL CARCINOMA IN SITU:  SOLID AND CRIBRIFORM TYPES, INTERMEDIATE TO HIGH      GRADE      MARGINS, INVASIVE:  NEGATIVE      CLOSEST, INVASIVE:  1.5 MM FROM INFERIOR MARGIN      MARGINS, DCIS:  NEGATIVE      CLOSEST, DCIS:  1.5 MM FROM LATERAL MARGIN (SEE PART 4 FOR FINAL POSTERIOR      MARGIN)      LYMPHOVASCULAR INVASION:  NOT IDENTIFIED      PROGNOSTIC MARKERS:      ER: 100%, POSITIVE, STRONG STAINING INTENSITY       PR: 95%, POSITIVE,  MODERATE STAINING INTENSITY      HER2: NEGATIVE, 0      OTHER:  NONE      SEE ONCOLOGY TABLE       3. Lymph node, sentinel, biopsy, #2,3.4.5,6,7 :      FIVE LYMPH NODES, NEGATIVE FOR METASTATIC CARCINOMA (0/5).       4. Breast, lumpectomy, Left, additional superior and postreior margins :      DUCTAL CARCINOMA IN SITU, HIGH GRADE.      DCIS INVOLVES INKED BLACK MARGIN / NEW POSTERIOR MARGIN.      NEW SUPERIOR MARGIN IS NEGATIVE FOR CARCINOMA. Diagnosis Note : INVASIVE CARCINOMA  OF THE BREAST:  Resection      Procedure: Lumpectomy      Specimen Laterality: Left      Histologic Type: Invasive ductal carcinoma      Histologic Grade:      Glandular (Acinar)/Tubular Differentiation: 3      Nuclear Pleomorphism: 3      Mitotic Rate: 2      Overall Grade: 3      Tumor Size: 1.0 cm      Ductal Carcinoma In Situ: Solid and cribriform types, intermediate to high grade      Lymphatic and/or Vascular Invasion:  Not identified      Treatment Effect in the Breast: No known presurgical therapy      Margins: All margins negative for invasive carcinoma      Distance from Closest Margin (mm): 1.5 mm from inferior margin      Specify Closest Margin (required only if <78mm): 1.5 mm from inferior margin      DCIS Margins: Involved by DCIS      Distance from Closest Margin (mm): 0      Specify Closest Margin (required only if <63mm): New posterior margin      Regional Lymph Nodes:      Number of Lymph Nodes Examined: 6      Number of Sentinel Nodes Examined: 6      Number of Lymph Nodes with Macrometastases (>2 mm): 0      Number of Lymph Nodes with Micrometastases: 0      Number of Lymph Nodes with Isolated Tumor Cells (=0.2 mm or =200 cells): 0      Size of Largest Metastatic Deposit (mm): NA      Extranodal Extension: NA      Distant Metastasis:      Distant Site(s) Involved: Not applicable      Breast Biomarker Testing Performed on Previous Biopsy:      Testing Performed  on Case Number: [id number]      Estrogen Receptor: 100%, positive, strong staining intensity      Progesterone Receptor: 95%, positive, moderate staining intensity      HER2: Negative, 0      Pathologic Stage Classification (pTNM, AJCC 8th Edition): pT1b, pN0      Representative Tumor Block: 2/A      Comment(s): None      (v4.5.0.0)     04/22/2023 Cancer Staging   Staging form: Breast, AJCC 8th Edition - Pathologic stage from 04/22/2023: Stage IA (pT1b, pN0, cM0, G3, ER+, PR+, HER2-, Oncotype DX score: 21) - Signed by Babara Call, MD on 05/27/2023 Stage prefix: Initial diagnosis Multigene prognostic tests performed: Oncotype DX Recurrence score range: Greater than or equal to 11 Histologic grading system: 3 grade system   05/20/2023 Procedure   S/p re-excision.   1. 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       - CHANGES CONSISTENT WITH PRIOR LUMPTECTOMY SITE.       - UNREMARKABLE SKIN.    06/11/2023 - 07/09/2023 Radiation Therapy   Adjuvant left breast RT        MEDICAL HISTORY:  Past Medical History:  Diagnosis Date   Anal fissure 12/09/2016   Added automatically from request for surgery [id number]    Added automatically from request for surgery  [id number]     Arthralgia of left knee 12/03/2020   Asthma 08/29/2022   Chronic/stable.    Follows with Dr. Elayne, pulmonology.   Using Flovent  and albuterol  inhaler as needed.     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     Heart murmur    History of  total knee arthroplasty 08/21/2021   Hyperlipidemia 08/29/2022   Lab Results   Component Value Date    Cholesterol 197 01/28/2022      Lab Results   Component Value Date    HDL 56 01/28/2022      Lab Results   Component Value Date    LDL Calculated 115 01/28/2022      Lab Results   Component Value Date    Triglycerides 131 01/28/2022      Lab Results   Component Value Date    Chol/HDL Ratio 3.5 01/28/2022      Criteria used to determine recommendation include:    Hypertension    Invasive carcinoma of breast (HCC)    Liver cirrhosis secondary to NASH (HCC)    Lung nodule    Major depressive disorder in full remission 04/02/2020   Mood stable on venlafaxine  150 mg daily.     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 breast of female, estrogen receptor positive (HCC)    Migraine 08/29/2022   NAFLD (nonalcoholic fatty liver disease) 92/76/7985   Non-alcoholic cirrhosis (HCC)    Osteoarthritis of left knee 09/03/2020   Peritoneal hematoma 07/07/2019   Sprain of MCL (medial collateral ligament) of knee 09/03/2020   Symptom associated with female genital organs 07/09/2010    SURGICAL HISTORY: Past Surgical History:  Procedure Laterality Date   ABDOMINAL HYSTERECTOMY     AXILLARY SENTINEL NODE BIOPSY Left 04/22/2023   Procedure: AXILLARY SENTINEL NODE BIOPSY;  Surgeon: Lane Shope, MD;  Location: [facility];  Service: General;  Laterality: Left;   BREAST BIOPSY Left 03/31/2023   US  LT BREAST BX W LOC DEV 1ST LESION IMG BX SPEC US  GUIDE 03/31/2023 [facility]   BREAST BIOPSY Left 04/20/2023   US  LT RADIO FREQUENCY TAG LOC US  GUIDE 04/20/2023 [facility]   BREAST BIOPSY Left 02/11/2024   US  LT BREAST BX W LOC DEV 1ST LESION IMG BX SPEC US  GUIDE 02/11/2024 [facility]   BREAST BIOPSY Left 02/11/2024   US  LT BREAST BX W LOC DEV EA ADD LESION IMG BX SPEC US  GUIDE 02/11/2024 [facility]   BREAST LUMPECTOMY WITH RADIOFREQUENCY TAG IDENTIFICATION Left 04/22/2023    Procedure: BREAST LUMPECTOMY WITH RADIOFREQUENCY TAG IDENTIFICATION;  Surgeon: Lane Shope, MD;  Location: [facility];  Service: General;  Laterality: Left;   CARDIAC CATHETERIZATION  03/10/2012   Negative   CATARACT EXTRACTION Bilateral    CESAREAN SECTION     CHOLECYSTECTOMY     RE-EXCISION OF BREAST LUMPECTOMY Left 05/20/2023   Procedure: EXCISION, LESION, BREAST, REPEAT;  Surgeon: Lane Shope, MD;  Location: [facility];  Service: General;  Laterality: Left;   SPLENECTOMY     TONSILLECTOMY      SOCIAL HISTORY: Social History   Socioeconomic History   Marital status: Legally Separated  Spouse name: Not on file   Number of children: 4   Years of education: Not on file   Highest education level: GED or equivalent  Occupational History   Occupation: Retired  Tobacco Use   Smoking status: Former    Current packs/day: 0.00    Average packs/day: 2.0 packs/day for 7.2 years (14.5 ttl pk-yrs)    Types: Cigarettes    Start date: 23    Quit date: 06/10/1983    Years since quitting: 40.7   Smokeless tobacco: Never  Vaping Use   Vaping status: Never Used  Substance and Sexual Activity   Alcohol use: No   Drug use: No   Sexual activity: Not on file  Other Topics Concern   Not on file  Social History Narrative   Not on file   Social Drivers of Health   Tobacco Use: Medium Risk (03/14/2024)   Patient History    Smoking Tobacco Use: Former    Smokeless Tobacco Use: Never    Passive Exposure: Not on file  Financial Resource Strain: Patient Declined (09/04/2023)   Overall Financial Resource Strain (CARDIA)    Difficulty of Paying Living Expenses: Patient declined  Food Insecurity: No Food Insecurity (09/04/2023)   Epic    Worried About Radiation Protection Practitioner of Food in the Last Year: Never true    Ran Out of Food in the Last Year: Never true  Transportation Needs: No Transportation Needs (09/04/2023)   Epic    Lack of Transportation (Medical): No    Lack of Transportation  (Non-Medical): No  Physical Activity: Insufficiently Active (09/04/2023)   Exercise Vital Sign    Days of Exercise per Week: 2 days    Minutes of Exercise per Session: 30 min  Stress: Stress Concern Present (09/04/2023)   [employer] of Occupational Health - Occupational Stress Questionnaire    Feeling of Stress: To some extent  Social Connections: Moderately Isolated (09/04/2023)   Social Connection and Isolation Panel    Frequency of Communication with Friends and Family: More than three times a week    Frequency of Social Gatherings with Friends and Family: Twice a week    Attends Religious Services: More than 4 times per year    Active Member of [employer] or Organizations: No    Attends Banker Meetings: Not on file    Marital Status: Separated  Intimate Partner Violence: Not At Risk (08/20/2023)   Epic    Fear of Current or Ex-Partner: No    Emotionally Abused: No    Physically Abused: No    Sexually Abused: No  Depression (PHQ2-9): Low Risk (03/14/2024)   Depression (PHQ2-9)    PHQ-2 Score: 0  Alcohol Screen: Low Risk (08/20/2023)   Alcohol Screen    Last Alcohol Screening Score (AUDIT): 0  Housing: Unknown (12/10/2023)   Received from [facility]   Epic    Unable to Pay for Housing in the Last Year: Not on file    Number of Times Moved in the Last Year: Not on file    At any time in the past 12 months, were you homeless or living in a shelter (including now)?: No  Utilities: Not At Risk (08/20/2023)   Epic    Threatened with loss of utilities: No  Health Literacy: Adequate Health Literacy (08/20/2023)   B1300 Health Literacy    Frequency of need for help with medical instructions: Never    FAMILY HISTORY: Family History  Problem Relation Age of Onset  Transient ischemic attack Mother    Thyroid  disease Mother    Arthritis Mother    Hearing loss Mother    High blood pressure Mother    High Cholesterol Mother    Miscarriages /  Stillbirths Mother    Stroke Mother    Cancer Father 86       metastatic, possible lung or prostate primary   Arthritis Father    Asthma Father    COPD Father    Depression Father    Early death Father    Hearing loss Father    Heart disease Father    High Cholesterol Father    Breast cancer Sister 14   Arthritis Sister    Birth defects Sister    Cancer Sister        breast cancer   Diabetes Sister    High blood pressure Sister    Diabetes Paternal Grandmother    Arthritis Paternal Grandfather    Cancer Paternal Grandfather    Breast cancer Daughter 19       TNBC   Cervical cancer Daughter    Breast cancer Niece 67    ALLERGIES:  is allergic to penicillins, zoloft [sertraline hcl], codeine, and sertraline.  MEDICATIONS:  Current Outpatient Medications  Medication Sig Dispense Refill   albuterol  (PROVENTIL  HFA) 108 (90 Base) MCG/ACT inhaler Inhale 1-2 puffs into the lungs every 6 (six) hours as needed for wheezing or shortness of breath. 8 g 6   aspirin  (ASPIRIN  81) 81 MG chewable tablet Chew 1 tablet (81 mg total) by mouth daily.     Calcium Carbonate (CALCIUM 500 PO) Take 500 mg by mouth in the morning and at bedtime.     ezetimibe  (ZETIA ) 10 MG tablet Take 1 tablet (10 mg total) by mouth at bedtime. 90 tablet 1   fluticasone -salmeterol (WIXELA INHUB) 250-50 MCG/ACT AEPB Inhale 1 puff into the lungs in the morning and at bedtime. 60 each 12   gabapentin  (NEURONTIN ) 100 MG capsule Take 1 capsule (100 mg total) by mouth 2 (two) times daily. (Patient taking differently: Take 300 mg by mouth 2 (two) times daily.) 120 capsule 0   lisinopril  (ZESTRIL ) 20 MG tablet Take 1 tablet (20 mg total) by mouth daily. 90 tablet 1   loratadine  (CLARITIN ) 10 MG tablet Take 1 tablet (10 mg total) by mouth daily as needed for allergies. 90 tablet 6   meloxicam  (MOBIC ) 7.5 MG tablet Take 1 tablet (7.5 mg total) by mouth daily. 30 tablet 0   metFORMIN  (GLUCOPHAGE -XR) 500 MG 24 hr tablet TAKE 2  TABLETS BY MOUTH 2 TIMES DAILY WITH A MEAL 360 tablet 1   methocarbamol  (ROBAXIN ) 500 MG tablet Take 1 tablet (500 mg total) by mouth every 8 (eight) hours as needed for up to 10 days for muscle spasms. 30 tablet 0   omeprazole  (PRILOSEC) 20 MG capsule TAKE 1 CAPSULE (20 MG TOTAL) BY MOUTH DAILY. 90 capsule 1   pravastatin  (PRAVACHOL ) 80 MG tablet Take 1 tablet (80 mg total) by mouth at bedtime. 90 tablet 1   predniSONE  (DELTASONE ) 20 MG tablet Take 2 tablets (40 mg total) by mouth daily with breakfast for 5 days. Take 2 pills daily with breakfast for 5 days 2 tablet 0   tamoxifen  (NOLVADEX ) 20 MG tablet Take 1 tablet (20 mg total) by mouth daily. 90 tablet 3   venlafaxine  XR (EFFEXOR  XR) 150 MG 24 hr capsule Take 1 capsule (150 mg total) by mouth daily with breakfast. 90 capsule  1   VITAMIN D, CHOLECALCIFEROL, PO Take 2,000 Units by mouth daily.     No current facility-administered medications for this visit.    Review of Systems  Constitutional:  Negative for appetite change, chills, fatigue and fever.  HENT:   Negative for hearing loss and voice change.   Eyes:  Negative for eye problems.  Respiratory:  Negative for chest tightness and cough.   Cardiovascular:  Negative for chest pain.       Pain over right ribs with movement  Gastrointestinal:  Negative for abdominal distention, abdominal pain and blood in stool.  Endocrine: Negative for hot flashes.  Genitourinary:  Negative for difficulty urinating and frequency.   Musculoskeletal:  Positive for arthralgias, back pain and gait problem.       Right shoulder pain with movement, right chest wall pain with movement, pain over thoracic spine with palpation  Skin:  Negative for itching and rash.  Neurological:  Positive for gait problem. Negative for extremity weakness.  Hematological:  Negative for adenopathy.  Psychiatric/Behavioral:  Negative for confusion.      PHYSICAL EXAMINATION: ECOG PERFORMANCE STATUS: 1 - Symptomatic but  completely ambulatory  Vitals:   03/14/24 1003 03/14/24 1016  BP: (!) 145/61 (!) 158/65  Pulse: 63   Temp: (!) 97.3 F (36.3 C)   SpO2: 100%    Filed Weights   03/14/24 1003  Weight: 147 lb (66.7 kg)    Physical Exam HENT:     Head: Normocephalic and atraumatic.  Eyes:     General: No scleral icterus. Cardiovascular:     Rate and Rhythm: Normal rate and regular rhythm.  Pulmonary:     Effort: Pulmonary effort is normal. No respiratory distress.  Abdominal:     General: There is no distension.  Musculoskeletal:        General: Tenderness present.     Cervical back: Normal range of motion and neck supple.     Comments: Decreased ROM of right arm  Skin:    Findings: No erythema.  Neurological:     Mental Status: She is alert and oriented to person, place, and time. Mental status is at baseline.     Motor: No abnormal muscle tone.  Psychiatric:        Mood and Affect: Mood and affect normal.      LABORATORY DATA:  I have reviewed the data as listed    Latest Ref Rng & Units 02/24/2024   12:24 PM 01/12/2024    9:56 AM 11/11/2023   11:51 AM  CBC  WBC 4.0 - 10.5 K/uL 8.0  9.4  9.0   Hemoglobin 12.0 - 15.0 g/dL 88.3  88.2  88.5   Hematocrit 36.0 - 46.0 % 35.0  36.2  34.5   Platelets 150.0 - 400.0 K/uL 315.0  369  331.0       Latest Ref Rng & Units 02/24/2024   12:24 PM 01/12/2024    9:56 AM 11/25/2023   12:37 PM  CMP  Glucose 70 - 99 mg/dL 82  888  878   BUN 6 - 23 mg/dL 23  25  16    Creatinine 0.40 - 1.20 mg/dL 9.03  8.92  9.06   Sodium 135 - 145 mEq/L 139  138  140   Potassium 3.5 - 5.1 mEq/L 4.9  4.2  3.9   Chloride 96 - 112 mEq/L 102  103  105   CO2 19 - 32 mEq/L 31  24  23    Calcium  8.4 - 10.5 mg/dL 9.8  9.2  9.5   Total Protein 6.0 - 8.3 g/dL 6.6  6.8    Total Bilirubin 0.2 - 1.2 mg/dL 0.3  0.5    Alkaline Phos 39 - 117 U/L 88  98    AST 5 - 37 U/L 28  32    ALT 3 - 35 U/L 24  21       RADIOGRAPHIC STUDIES: I have personally reviewed the  radiological images as listed and agreed with the findings in the report. DG Ribs Unilateral Right Result Date: 03/14/2024 CLINICAL DATA:  Acute pain EXAM: RIGHT RIBS - 2 VIEW COMPARISON:  April 16, 2023. FINDINGS: The cardiomediastinal silhouette is grossly unchanged in contour. No RIGHT-sided pleural effusion. No significant RIGHT-sided pneumothorax. No acute displaced RIGHT-sided rib fracture. IMPRESSION: No acute displaced RIGHT-sided rib fracture. Electronically Signed   By: Corean Salter M.D.   On: 03/14/2024 12:26   DG Thoracic Spine 2 View Result Date: 03/14/2024 CLINICAL DATA:  Acute pain EXAM: THORACIC SPINE 2 VIEWS COMPARISON:  April 16, 2023 FINDINGS: There is no evidence of thoracic spine fracture. Alignment is normal. Limited assessment of the cervicothoracic junction secondary to overlapping structures. Atherosclerotic calcifications of the aorta. Surgical clips of the RIGHT upper quadrant. IMPRESSION: No acute fracture or static subluxation of the thoracic spine. If persistent clinical concern for traumatic injury, recommend dedicated cross-sectional imaging. Electronically Signed   By: Corean Salter M.D.   On: 03/14/2024 12:25    ASSESSMENT & PLAN:   Right rib/chest wall pain:  Acute pain post picking up elderly mother from floor post fall.  Likely muscle strain.  DG of right ribs ordered and r/o any acute rib fx.  Tenderness with any movement of right arm as well as bending up and down twisting right to left.  Tenderness with palpation of right chest wall that extends underneath right arm.  Ordered 5 days of prednisone  for pain and inflammation, ordered low dose robaxin  for pain control referred to orthopedics as well.  Thoracic spine pain:  Tenderness over thoracic spine with palpation, no changes in mobility DG of thoracic spine ordered and reviewed r/o any acute fractures.  Ordered 5 days of prednisone , low dose muscle relaxer and referred to orthopedics.  Right  shoulder pain: Pain with movement no changes in rom no deformities referred to orthopedics for further management   Cancer Staging  Invasive carcinoma of breast [facility]) Staging form: Breast, AJCC 8th Edition - Clinical stage from 04/08/2023: Stage IA (cT1b, cN0, cM0, G3, ER+, PR+, HER2-) - Signed by Babara Call, MD on 04/08/2023 - Pathologic stage from 04/22/2023: Stage IA (pT1b, pN0, cM0, G3, ER+, PR+, HER2-, Oncotype DX score: 21) - Signed by Babara Call, MD on 05/27/2023  Follow up plan:  Xray ordered of thoracic spine/shoulder/ribs Referral for ortho F/u per [facility]    Morna Husband AGNP-C 03/14/24    "

## 2024-03-15 NOTE — Telephone Encounter (Signed)
 Caller verified using pt's full name and dob prior to discussing PHI    Patient called. Unable to get her prednisone  rx. Quantity was written for #2 instead of #10. Pharmacy also sent msg to clinic this am about this.  RX resubmitted to pharmacy with correct # of quantity. Pt thanked me for assisting her.

## 2024-03-18 ENCOUNTER — Telehealth: Payer: Self-pay

## 2024-03-18 NOTE — Telephone Encounter (Signed)
 Called and discussed symptoms with patient.  She states that the test was positive this morning. No fever, chills. Slight cough. Given her cancer history, I would recommend treatment however her recent kidney functions were low.   I have advised to her monitor symptoms and please go UC and/or ER if symptoms worsen.   Patient verbalizes understanding.   Sent mychart message with symptom management recommendations.  -Carrol Aurora, DNP, AGNP-C 03/18/2024 12:41 PM

## 2024-03-18 NOTE — Telephone Encounter (Signed)
 I know there are no openings today with any provider; please advise.

## 2024-03-18 NOTE — Telephone Encounter (Signed)
 Copied from CRM [id number]. Topic: Clinical - Medical Advice >> Mar 18, 2024 10:45 AM Chasity T wrote: Reason for CRM: patient has taken at home test for covid and it came back postive. Wants to know what medication she can take to help.   Please contact back at: [phone]

## 2024-03-22 ENCOUNTER — Other Ambulatory Visit: Payer: Self-pay | Admitting: General Practice

## 2024-03-22 DIAGNOSIS — M160 Bilateral primary osteoarthritis of hip: Secondary | ICD-10-CM

## 2024-03-30 ENCOUNTER — Encounter: Admitting: General Practice

## 2024-04-04 ENCOUNTER — Other Ambulatory Visit: Payer: Self-pay | Admitting: General Practice

## 2024-04-04 DIAGNOSIS — K219 Gastro-esophageal reflux disease without esophagitis: Secondary | ICD-10-CM

## 2024-04-11 ENCOUNTER — Encounter: Admitting: General Practice

## 2024-04-13 ENCOUNTER — Encounter: Payer: Self-pay | Admitting: Oncology

## 2024-04-13 ENCOUNTER — Inpatient Hospital Stay: Attending: Oncology

## 2024-04-13 ENCOUNTER — Inpatient Hospital Stay: Admitting: Oncology

## 2024-04-13 VITALS — BP 133/65 | HR 69 | Temp 98.4°F | Resp 18 | Wt 152.3 lb

## 2024-04-13 DIAGNOSIS — D649 Anemia, unspecified: Secondary | ICD-10-CM | POA: Diagnosis not present

## 2024-04-13 DIAGNOSIS — C50919 Malignant neoplasm of unspecified site of unspecified female breast: Secondary | ICD-10-CM | POA: Diagnosis not present

## 2024-04-13 DIAGNOSIS — M8580 Other specified disorders of bone density and structure, unspecified site: Secondary | ICD-10-CM

## 2024-04-13 DIAGNOSIS — D72821 Monocytosis (symptomatic): Secondary | ICD-10-CM | POA: Diagnosis not present

## 2024-04-13 LAB — CMP (CANCER CENTER ONLY)
ALT: 21 U/L (ref 0–44)
AST: 33 U/L (ref 15–41)
Albumin: 3.9 g/dL (ref 3.5–5.0)
Alkaline Phosphatase: 103 U/L (ref 38–126)
Anion gap: 12 (ref 5–15)
BUN: 25 mg/dL — ABNORMAL HIGH (ref 8–23)
CO2: 25 mmol/L (ref 22–32)
Calcium: 9.7 mg/dL (ref 8.9–10.3)
Chloride: 103 mmol/L (ref 98–111)
Creatinine: 1.21 mg/dL — ABNORMAL HIGH (ref 0.44–1.00)
GFR, Estimated: 47 mL/min — ABNORMAL LOW
Glucose, Bld: 91 mg/dL (ref 70–99)
Potassium: 4.5 mmol/L (ref 3.5–5.1)
Sodium: 140 mmol/L (ref 135–145)
Total Bilirubin: 0.2 mg/dL (ref 0.0–1.2)
Total Protein: 6.8 g/dL (ref 6.5–8.1)

## 2024-04-13 LAB — CBC WITH DIFFERENTIAL (CANCER CENTER ONLY)
Abs Immature Granulocytes: 0.04 10*3/uL (ref 0.00–0.07)
Basophils Absolute: 0.1 10*3/uL (ref 0.0–0.1)
Basophils Relative: 1 %
Eosinophils Absolute: 0.8 10*3/uL — ABNORMAL HIGH (ref 0.0–0.5)
Eosinophils Relative: 8 %
HCT: 34.6 % — ABNORMAL LOW (ref 36.0–46.0)
Hemoglobin: 11 g/dL — ABNORMAL LOW (ref 12.0–15.0)
Immature Granulocytes: 0 %
Lymphocytes Relative: 29 %
Lymphs Abs: 2.9 10*3/uL (ref 0.7–4.0)
MCH: 29.3 pg (ref 26.0–34.0)
MCHC: 31.8 g/dL (ref 30.0–36.0)
MCV: 92 fL (ref 80.0–100.0)
Monocytes Absolute: 1.4 10*3/uL — ABNORMAL HIGH (ref 0.1–1.0)
Monocytes Relative: 14 %
Neutro Abs: 4.9 10*3/uL (ref 1.7–7.7)
Neutrophils Relative %: 48 %
Platelet Count: 320 10*3/uL (ref 150–400)
RBC: 3.76 MIL/uL — ABNORMAL LOW (ref 3.87–5.11)
RDW: 15.1 % (ref 11.5–15.5)
WBC Count: 10.1 10*3/uL (ref 4.0–10.5)
nRBC: 0 % (ref 0.0–0.2)

## 2024-04-13 NOTE — Assessment & Plan Note (Addendum)
 Hemoglobin trends down. Check iron tibc ferritin B12, folate myeloma panel

## 2024-04-13 NOTE — Addendum Note (Signed)
 Addended by: BABARA CALL on: 04/13/2024 05:16 PM   Modules accepted: Orders, Level of Service

## 2024-04-13 NOTE — Assessment & Plan Note (Addendum)
 Chronic monocytosis. Possibly reactive due to sinusitis or arthritis.  She did not want to have additional blood work up today.  Check work up at next visit.

## 2024-04-13 NOTE — Assessment & Plan Note (Addendum)
 Left breast invasive carcinoma G3, with high-grade DCIS s/p lumpectomy SLNB, positive margin, s/p re-excision. pT1b pN0 ER100%, PR 95% HER2 (0) Oncotype Dx score 21, will not offer adjuvant chemotherapy.  S/p adjuvant radiation  Recommend adjuvant endocrine therapy.  Previously on Arimidex , osteopenia, patient declines bone strengthening agent due to dental issue and switched to Tamoxifen  20 mg daily.    Recommend aspirin  81 mg daily for thrombosis prophylaxis.. Nov 2025  bilateral diagnostic mammogram findings were reviewed. Biopsy showed benign findings. Check left breast diagnostic mammogram/US  in 6 months. Bianca Rogers

## 2024-04-13 NOTE — Progress Notes (Addendum)
 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 pT1b pN0 ER100%, PR 95% HER2 (0) Oncotype Dx score 21, will not offer adjuvant chemotherapy.  S/p adjuvant radiation  Recommend adjuvant endocrine therapy.  Previously on Arimidex , osteopenia, patient declines bone strengthening agent due to dental issue and switched to Tamoxifen  20 mg daily.    Recommend aspirin  81 mg daily for thrombosis prophylaxis.. Nov 2025  bilateral diagnostic mammogram findings were reviewed. Biopsy showed benign findings. Check left breast diagnostic mammogram/US  in 6 months. .   Osteopenia DEXA results showed osteopenia, FRAX score 10-year major pathological fracture 21.9%. Recommend calcium and vitamin D supplementation.  Recommend patient to follow up with PCP for management of osteopenia  Monocytosis Chronic monocytosis. Possibly reactive due to sinusitis or arthritis.  She did not want to have additional blood work up today.  Check work up at next visit.   Normocytic anemia Hemoglobin trends down. Check iron tibc ferritin B12, folate myeloma panel    Orders Placed This Encounter  Procedures   MM 3D DIAGNOSTIC MAMMOGRAM UNILATERAL LEFT BREAST    Standing Status:   Future    Expected Date:   08/11/2024    Expiration Date:    04/13/2025    Reason for Exam (SYMPTOM  OR DIAGNOSIS REQUIRED):   left breast mass follow up    Preferred imaging location?:   [facility]   US  LIMITED ULTRASOUND INCLUDING AXILLA LEFT BREAST     Standing Status:   Future    Expected Date:   08/11/2024    Expiration Date:   04/13/2025    Reason for Exam (SYMPTOM  OR DIAGNOSIS REQUIRED):   left breast mass    Preferred imaging location?:   [facility]   CBC with Differential (Cancer Center Only)    Standing Status:   Future    Expected Date:   07/11/2024    Expiration Date:   10/09/2024   Flow cytometry panel-leukemia/lymphoma work-up    Standing Status:   Future    Expected Date:   07/11/2024    Expiration Date:   10/09/2024   BCR-ABL1 FISH    Standing Status:   Future    Expected Date:   07/11/2024    Expiration Date:   10/09/2024   JAK2 [id number] rfx CALR/MPL/E12-15    Standing Status:   Future    Expected Date:   07/11/2024    Expiration Date:   10/09/2024   Follow-up  3 months.  All questions were answered. The patient knows to call the clinic with any problems, questions or concerns.  Call Babara, MD, PhD [facility] Hematology Oncology 04/13/2024    HISTORY OF PRESENTING ILLNESS:  Bianca Rogers 74 y.o. female presents to establish care for left breast invasive carcinoma I have reviewed her chart and materials related to her cancer extensively and collaborated  history with the patient. Summary of oncologic history is as follows: Oncology History  Invasive carcinoma of breast (HCC)  03/26/2023 Mammogram   Diagnostic unilateral left breast mammogram showed 1. 0.8 cm suspicious mass within the LOWER OUTER LEFT breast, likely representing the enlarging mammographic finding. Tissue sampling is recommended. No abnormal appearing LEFT axillary lymph nodes.   04/08/2023 Initial Diagnosis   Invasive carcinoma of breast [facility])  Patient presented for left breast diagnostic mammogram for follow-up of left breast asymmetry from outside facility.   She denies any breast concerns.  Left breast 5:00 4 cm from nipple needle core biopsy showed - INVASIVE MAMMARY CARCINOMA, NO SPECIAL TYPE.       - TUBULE FORMATION: SCORE 3       - NUCLEAR PLEOMORPHISM: SCORE 3       - MITOTIC COUNT: SCORE 2       - TOTAL SCORE: 8       - OVERALL GRADE: 3       - LYMPHOVASCULAR INVASION: NOT IDENTIFIED       - CANCER LENGTH: 8 MM       - CALCIFICATIONS: NOT IDENTIFIED       - DUCTAL CARCINOMA IN SITU: PRESENT, HIGH-GRADE   ER 100%, PR 95%, HER2 negative [0]   Menarche at age of 66 or 57. First live birth at age of 34 OCP use: Less than 5 years of use. History of hysterectomy: Yes with removal of 1 ovary. Menopausal status: Postmenopausal History of HRT use: Denies History of chest radiation: Denies Number of previous breast biopsies: Denies Family history positive for multiple family members with breast cancer.     04/08/2023 Cancer Staging   Staging form: Breast, AJCC 8th Edition - Clinical stage from 04/08/2023: Stage IA (cT1b, cN0, cM0, G3, ER+, PR+, HER2-) - Signed by Babara Call, MD on 04/08/2023 Stage prefix: Initial diagnosis Histologic grading system: 3 grade system    Genetic Testing   Negative CancerNext-Expanded +RNAinsight panel. 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 The CancerNext-Expanded gene panel offered by [facility] and includes sequencing, rearrangement, and RNA analysis for the following 76 genes: AIP, ALK, APC, ATM, AXIN2, BAP1, BARD1, BMPR1A, BRCA1, BRCA2, BRIP1, CDC73, CDH1, CDK4, CDKN1B, CDKN2A, CEBPA, CHEK2, CTNNA1, DDX41, DICER1, ETV6, FH, FLCN, GATA2, LZTR1, MAX, MBD4, MEN1, MET, MLH1, MSH2, MSH3, MSH6, MUTYH, NF1, NF2, NTHL1, PALB2, PHOX2B, PMS2, POT1, PRKAR1A, PTCH1, PTEN, RAD51C, RAD51D, RB1, RET, RUNX1, SDHA, SDHAF2, SDHB, SDHC, SDHD, SMAD4, SMARCA4, SMARCB1, SMARCE1, STK11, SUFU, TMEM127, TP53, TSC1, TSC2, VHL, and WT1 (sequencing and deletion/duplication); EGFR, HOXB13, KIT, MITF, PDGFRA, POLD1, and POLE (sequencing  only); EPCAM and GREM1 (deletion/duplication only). Report date 05/07/23.    04/13/2023 Imaging   MRI breast bilateral w wo contrast   1. Biopsy-proven 0.8 cm malignancy within the posterior LOWER LEFT breast. No other suspicious abnormalities noted within either breast although motion artifact slightly decreases sensitivity. 2. No abnormal appearing lymph nodes.      04/22/2023 Surgery   Patient underwent left breast lumpectomy and sentinel lymph node biopsy. Pathology showed 1. Lymph node, sentinel, biopsy, #1 :      ONE LYMPH NODE, NEGATIVE FOR METASTATIC CARCINOMA (0/1).       2. Breast, lumpectomy, Left inferior tissue :      INVASIVE DUCTAL CARCINOMA, 1.0 CM, GRADE 3      DUCTAL CARCINOMA IN SITU:  SOLID AND CRIBRIFORM TYPES, INTERMEDIATE TO HIGH      GRADE      MARGINS, INVASIVE:  NEGATIVE  CLOSEST, INVASIVE:  1.5 MM FROM INFERIOR MARGIN      MARGINS, DCIS:  NEGATIVE      CLOSEST, DCIS:  1.5 MM FROM LATERAL MARGIN (SEE PART 4 FOR FINAL POSTERIOR      MARGIN)      LYMPHOVASCULAR INVASION:  NOT IDENTIFIED      PROGNOSTIC MARKERS:      ER: 100%, POSITIVE, STRONG STAINING INTENSITY      PR: 95%, POSITIVE,  MODERATE STAINING INTENSITY      HER2: NEGATIVE, 0      OTHER:  NONE      SEE ONCOLOGY TABLE       3. Lymph node, sentinel, biopsy, #2,3.4.5,6,7 :      FIVE LYMPH NODES, NEGATIVE FOR METASTATIC CARCINOMA (0/5).       4. Breast, lumpectomy, Left, additional superior and postreior margins :      DUCTAL CARCINOMA IN SITU, HIGH GRADE.      DCIS INVOLVES INKED BLACK MARGIN / NEW POSTERIOR MARGIN.      NEW SUPERIOR MARGIN IS NEGATIVE FOR CARCINOMA. Diagnosis Note : INVASIVE CARCINOMA OF THE BREAST:  Resection      Procedure: Lumpectomy      Specimen Laterality: Left      Histologic Type: Invasive ductal carcinoma      Histologic Grade:      Glandular (Acinar)/Tubular Differentiation: 3      Nuclear Pleomorphism: 3      Mitotic Rate: 2      Overall Grade: 3      Tumor  Size: 1.0 cm      Ductal Carcinoma In Situ: Solid and cribriform types, intermediate to high grade      Lymphatic and/or Vascular Invasion:  Not identified      Treatment Effect in the Breast: No known presurgical therapy      Margins: All margins negative for invasive carcinoma      Distance from Closest Margin (mm): 1.5 mm from inferior margin      Specify Closest Margin (required only if <20mm): 1.5 mm from inferior margin      DCIS Margins: Involved by DCIS      Distance from Closest Margin (mm): 0      Specify Closest Margin (required only if <27mm): New posterior margin      Regional Lymph Nodes:      Number of Lymph Nodes Examined: 6      Number of Sentinel Nodes Examined: 6      Number of Lymph Nodes with Macrometastases (>2 mm): 0      Number of Lymph Nodes with Micrometastases: 0      Number of Lymph Nodes with Isolated Tumor Cells (=0.2 mm or =200 cells): 0      Size of Largest Metastatic Deposit (mm): NA      Extranodal Extension: NA      Distant Metastasis:      Distant Site(s) Involved: Not applicable      Breast Biomarker Testing Performed on Previous Biopsy:      Testing Performed on Case Number: [id number]      Estrogen Receptor: 100%, positive, strong staining intensity      Progesterone Receptor: 95%, positive, moderate staining intensity      HER2: Negative, 0      Pathologic Stage Classification (pTNM, AJCC 8th Edition): pT1b, pN0      Representative Tumor Block: 2/A      Comment(s): None      (v4.5.0.0)  04/22/2023 Cancer Staging   Staging form: Breast, AJCC 8th Edition - Pathologic stage from 04/22/2023: Stage IA (pT1b, pN0, cM0, G3, ER+, PR+, HER2-, Oncotype DX score: 21) - Signed by Babara Call, MD on 05/27/2023 Stage prefix: Initial diagnosis Multigene prognostic tests performed: Oncotype DX Recurrence score range: Greater than or equal to 11 Histologic grading system: 3 grade system   05/20/2023 Procedure   S/p re-excision.   1. Breast, excision, left  inferior :       - SMALL 1 MM FOCUS OF RESIDUAL DUCTAL CARCINOMA IN SITU (DCIS); FINAL MARGINS       ARE NEGATIVE (DCIS IS 0.1 MM TO THE CLOSEST MEDIAL MARGIN).       - CHANGES CONSISTENT WITH PRIOR LUMPTECTOMY SITE.       - UNREMARKABLE SKIN.    06/11/2023 - 07/09/2023 Radiation Therapy   Adjuvant left breast RT    Discussed the use of AI scribe software for clinical note transcription with the patient, who gave verbal consent to proceed.     She is currently taking tamoxifen  20mg  daily.  Manageable hot flash .  A diagnostic mammogram in November revealed multiple left breast masses, biopsies showed benign breast tissue with dense stromal fibrosis . She denies abnormal breast discharge or new breast symptoms.  She reports morning nausea over the past week, resolving quickly and possibly related to nocturnal awakenings for caregiving and frequent urination. She denies vomiting, fever, chills, or night sweats. She had COVID-19 infection approximately recently,  In January 2026 She experienced significant musculoskeletal pain involving the shoulder, back, chest wall, and ribs, with associated hand and arm numbness and pleuritic pain, following physical strain while lifting her mother. X ray showed non displacing rib fracture.  Symptoms improved with a short course of muscle relaxants and a five-day course of prednisone  in early January. She currently has no ongoing pain and is more cautious with physical activity. No recent falls reported.   MEDICAL HISTORY:  Past Medical History:  Diagnosis Date   Anal fissure 12/09/2016   Added automatically from request for surgery [id number]    Added automatically from request for surgery [id number]     Arthralgia of left knee 12/03/2020   Asthma 08/29/2022   Chronic/stable.    Follows with Dr. Elayne, pulmonology.   Using Flovent  and albuterol  inhaler as needed.     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     Heart murmur    History of total knee arthroplasty 08/21/2021   Hyperlipidemia 08/29/2022   Lab Results   Component Value Date    Cholesterol 197 01/28/2022      Lab Results   Component Value Date    HDL 56 01/28/2022      Lab Results   Component Value Date    LDL Calculated 115 01/28/2022      Lab Results   Component Value Date    Triglycerides 131 01/28/2022      Lab Results   Component Value Date    Chol/HDL Ratio 3.5 01/28/2022      Criteria used to determine recommendation include:    Hypertension  Invasive carcinoma of breast (HCC)    Liver cirrhosis secondary to NASH (HCC)    Lung nodule    Major depressive disorder in full remission 04/02/2020   Mood stable on venlafaxine  150 mg daily.     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 breast of female, estrogen receptor positive (HCC)    Migraine 08/29/2022   NAFLD (nonalcoholic fatty liver disease) 92/76/7985   Non-alcoholic cirrhosis (HCC)    Osteoarthritis of left knee 09/03/2020   Peritoneal hematoma 07/07/2019   Sprain of MCL (medial collateral ligament) of knee 09/03/2020   Symptom associated with female genital organs 07/09/2010    SURGICAL HISTORY: Past Surgical History:  Procedure Laterality Date   ABDOMINAL HYSTERECTOMY     AXILLARY SENTINEL NODE BIOPSY Left 04/22/2023   Procedure: AXILLARY SENTINEL NODE BIOPSY;  Surgeon: Lane Shope, MD;  Location: [facility];  Service: General;  Laterality: Left;   BREAST BIOPSY Left 03/31/2023   US  LT BREAST BX W LOC DEV 1ST LESION IMG BX SPEC US  GUIDE 03/31/2023 [facility]   BREAST BIOPSY Left 04/20/2023   US  LT RADIO FREQUENCY TAG LOC US  GUIDE 04/20/2023  [facility]   BREAST BIOPSY Left 02/11/2024   US  LT BREAST BX W LOC DEV 1ST LESION IMG BX SPEC US  GUIDE 02/11/2024 [facility]   BREAST BIOPSY Left 02/11/2024   US  LT BREAST BX W LOC DEV EA ADD LESION IMG BX SPEC US  GUIDE 02/11/2024 [facility]   BREAST LUMPECTOMY WITH RADIOFREQUENCY TAG IDENTIFICATION Left 04/22/2023   Procedure: BREAST LUMPECTOMY WITH RADIOFREQUENCY TAG IDENTIFICATION;  Surgeon: Lane Shope, MD;  Location: [facility];  Service: General;  Laterality: Left;   CARDIAC CATHETERIZATION  03/10/2012   Negative   CATARACT EXTRACTION Bilateral    CESAREAN SECTION     CHOLECYSTECTOMY     RE-EXCISION OF BREAST LUMPECTOMY Left 05/20/2023   Procedure: EXCISION, LESION, BREAST, REPEAT;  Surgeon: Lane Shope, MD;  Location: [facility];  Service: General;  Laterality: Left;   SPLENECTOMY     TONSILLECTOMY      SOCIAL HISTORY: Social History   Socioeconomic History   Marital status: Legally Separated    Spouse name: Not on file   Number of children: 4   Years of education: Not on file   Highest education level: GED or equivalent  Occupational History   Occupation: Retired  Tobacco Use   Smoking status: Former    Current packs/day: 0.00    Average packs/day: 2.0 packs/day for 7.2 years (14.5 ttl pk-yrs)    Types: Cigarettes    Start date: 72    Quit date: 06/10/1983    Years since quitting: 40.8   Smokeless tobacco: Never  Vaping Use   Vaping status: Never Used  Substance and Sexual Activity   Alcohol use: No   Drug use: No   Sexual activity: Not on file  Other Topics Concern   Not on file  Social History Narrative   Not on file   Social Drivers of Health   Tobacco Use: Medium Risk (04/13/2024)   Patient History    Smoking Tobacco Use: Former    Smokeless Tobacco Use: Never    Passive Exposure: Not on file  Financial Resource Strain: Patient Declined (09/04/2023)   Overall Financial Resource Strain (CARDIA)    Difficulty of Paying Living  Expenses: Patient declined  Food Insecurity: No Food Insecurity (09/04/2023)   Epic    Worried About Radiation Protection Practitioner of Food in the Last Year: Never true    Ran Out  of Food in the Last Year: Never true  Transportation Needs: No Transportation Needs (09/04/2023)   Epic    Lack of Transportation (Medical): No    Lack of Transportation (Non-Medical): No  Physical Activity: Insufficiently Active (09/04/2023)   Exercise Vital Sign    Days of Exercise per Week: 2 days    Minutes of Exercise per Session: 30 min  Stress: Stress Concern Present (09/04/2023)   [employer] of Occupational Health - Occupational Stress Questionnaire    Feeling of Stress: To some extent  Social Connections: Moderately Isolated (09/04/2023)   Social Connection and Isolation Panel    Frequency of Communication with Friends and Family: More than three times a week    Frequency of Social Gatherings with Friends and Family: Twice a week    Attends Religious Services: More than 4 times per year    Active Member of [employer] or Organizations: No    Attends Banker Meetings: Not on file    Marital Status: Separated  Intimate Partner Violence: Not At Risk (08/20/2023)   Epic    Fear of Current or Ex-Partner: No    Emotionally Abused: No    Physically Abused: No    Sexually Abused: No  Depression (PHQ2-9): Low Risk (03/14/2024)   Depression (PHQ2-9)    PHQ-2 Score: 0  Alcohol Screen: Low Risk (08/20/2023)   Alcohol Screen    Last Alcohol Screening Score (AUDIT): 0  Housing: Unknown (12/10/2023)   Received from [facility]   Epic    Unable to Pay for Housing in the Last Year: Not on file    Number of Times Moved in the Last Year: Not on file    At any time in the past 12 months, were you homeless or living in a shelter (including now)?: No  Utilities: Not At Risk (08/20/2023)   Epic    Threatened with loss of utilities: No  Health Literacy: Adequate Health Literacy (08/20/2023)   B13[facility] Health  Literacy    Frequency of need for help with medical instructions: Never    FAMILY HISTORY: Family History  Problem Relation Age of Onset   Transient ischemic attack Mother    Thyroid  disease Mother    Arthritis Mother    Hearing loss Mother    High blood pressure Mother    High Cholesterol Mother    Miscarriages / Stillbirths Mother    Stroke Mother    Cancer Father 39       metastatic, possible lung or prostate primary   Arthritis Father    Asthma Father    COPD Father    Depression Father    Early death Father    Hearing loss Father    Heart disease Father    High Cholesterol Father    Breast cancer Sister 28   Arthritis Sister    Birth defects Sister    Cancer Sister        breast cancer   Diabetes Sister    High blood pressure Sister    Diabetes Paternal Grandmother    Arthritis Paternal Grandfather    Cancer Paternal Grandfather    Breast cancer Daughter 41       TNBC   Cervical cancer Daughter    Breast cancer Niece 14    ALLERGIES:  is allergic to penicillins, zoloft [sertraline hcl], codeine, and sertraline.  MEDICATIONS:  Current Outpatient Medications  Medication Sig Dispense Refill   albuterol  (PROVENTIL  HFA) 108 (90 Base) MCG/ACT inhaler Inhale  1-2 puffs into the lungs every 6 (six) hours as needed for wheezing or shortness of breath. 8 g 6   aspirin  (ASPIRIN  81) 81 MG chewable tablet Chew 1 tablet (81 mg total) by mouth daily.     Calcium Carbonate (CALCIUM 500 PO) Take 500 mg by mouth in the morning and at bedtime.     ezetimibe  (ZETIA ) 10 MG tablet Take 1 tablet (10 mg total) by mouth at bedtime. 90 tablet 1   fluticasone -salmeterol (WIXELA INHUB) 250-50 MCG/ACT AEPB Inhale 1 puff into the lungs in the morning and at bedtime. 60 each 12   gabapentin  (NEURONTIN ) 100 MG capsule Take 1 capsule (100 mg total) by mouth 2 (two) times daily. (Patient taking differently: Take 300 mg by mouth 2 (two) times daily.) 120 capsule 0   lisinopril  (ZESTRIL ) 20 MG  tablet Take 1 tablet (20 mg total) by mouth daily. 90 tablet 1   loratadine  (CLARITIN ) 10 MG tablet Take 1 tablet (10 mg total) by mouth daily as needed for allergies. 90 tablet 6   meloxicam  (MOBIC ) 7.5 MG tablet Take 1 tablet by mouth once daily 30 tablet 0   metFORMIN  (GLUCOPHAGE -XR) 500 MG 24 hr tablet TAKE 2 TABLETS BY MOUTH 2 TIMES DAILY WITH A MEAL 360 tablet 1   omeprazole  (PRILOSEC) 20 MG capsule TAKE 1 CAPSULE EVERY DAY 90 capsule 3   pravastatin  (PRAVACHOL ) 80 MG tablet Take 1 tablet (80 mg total) by mouth at bedtime. 90 tablet 1   predniSONE  (DELTASONE ) 20 MG tablet TAKE 2 TABLETS BY MOUTH WITH BREAKFAST FOR 5 DAYS 10 tablet 0   tamoxifen  (NOLVADEX ) 20 MG tablet Take 1 tablet (20 mg total) by mouth daily. 90 tablet 3   venlafaxine  XR (EFFEXOR  XR) 150 MG 24 hr capsule Take 1 capsule (150 mg total) by mouth daily with breakfast. 90 capsule 1   VITAMIN D, CHOLECALCIFEROL, PO Take 2,000 Units by mouth daily.     No current facility-administered medications for this visit.    Review of Systems  Constitutional:  Negative for appetite change, chills, fatigue and fever.  HENT:   Negative for hearing loss and voice change.   Eyes:  Negative for eye problems.  Respiratory:  Negative for chest tightness and cough.   Cardiovascular:  Negative for chest pain.  Gastrointestinal:  Negative for abdominal distention, abdominal pain and blood in stool.  Endocrine: Negative for hot flashes.  Genitourinary:  Negative for difficulty urinating and frequency.   Musculoskeletal:  Positive for arthralgias and gait problem.  Skin:  Negative for itching and rash.  Neurological:  Positive for gait problem. Negative for extremity weakness.  Hematological:  Negative for adenopathy.  Psychiatric/Behavioral:  Negative for confusion.      PHYSICAL EXAMINATION: ECOG PERFORMANCE STATUS: 1 - Symptomatic but completely ambulatory  Vitals:   04/13/24 1039 04/13/24 1046  BP: (!) 149/56 133/65  Pulse: 69    Resp: 18   Temp: 98.4 F (36.9 C)   SpO2: 99%    Filed Weights   04/13/24 1039  Weight: 152 lb 4.8 oz (69.1 kg)    Physical Exam HENT:     Head: Normocephalic and atraumatic.  Eyes:     General: No scleral icterus. Cardiovascular:     Rate and Rhythm: Normal rate and regular rhythm.  Pulmonary:     Effort: Pulmonary effort is normal. No respiratory distress.  Abdominal:     General: There is no distension.  Musculoskeletal:  General: Normal range of motion.     Cervical back: Normal range of motion and neck supple.  Skin:    Findings: No erythema.  Neurological:     Mental Status: She is alert and oriented to person, place, and time. Mental status is at baseline.     Motor: No abnormal muscle tone.  Psychiatric:        Mood and Affect: Mood and affect normal.      LABORATORY DATA:  I have reviewed the data as listed    Latest Ref Rng & Units 04/13/2024   10:25 AM 02/24/2024   12:24 PM 01/12/2024    9:56 AM  CBC  WBC 4.0 - 10.5 K/uL 10.1  8.0  9.4   Hemoglobin 12.0 - 15.0 g/dL 88.9  88.3  88.2   Hematocrit 36.0 - 46.0 % 34.6  35.0  36.2   Platelets 150 - 400 K/uL 320  315.0  369       Latest Ref Rng & Units 04/13/2024   10:24 AM 02/24/2024   12:24 PM 01/12/2024    9:56 AM  CMP  Glucose 70 - 99 mg/dL 91  82  888   BUN 8 - 23 mg/dL 25  23  25    Creatinine 0.44 - 1.00 mg/dL 8.78  9.03  8.92   Sodium 135 - 145 mmol/L 140  139  138   Potassium 3.5 - 5.1 mmol/L 4.5  4.9  4.2   Chloride 98 - 111 mmol/L 103  102  103   CO2 22 - 32 mmol/L 25  31  24    Calcium 8.9 - 10.3 mg/dL 9.7  9.8  9.2   Total Protein 6.5 - 8.1 g/dL 6.8  6.6  6.8   Total Bilirubin 0.0 - 1.2 mg/dL 0.2  0.3  0.5   Alkaline Phos 38 - 126 U/L 103  88  98   AST 15 - 41 U/L 33  28  32   ALT 0 - 44 U/L 21  24  21       RADIOGRAPHIC STUDIES: I have personally reviewed the radiological images as listed and agreed with the findings in the report. No results found.    "

## 2024-04-13 NOTE — Assessment & Plan Note (Signed)
 DEXA results showed osteopenia, FRAX score 10-year major pathological fracture 21.9%. Recommend calcium and vitamin D supplementation.  Recommend patient to follow up with PCP for management of osteopenia

## 2024-04-13 NOTE — Progress Notes (Signed)
 Survivorship Care Plan visit completed.  Treatment summary reviewed and given to patient.  ASCO answers booklet reviewed and given to patient.  CARE program and Cancer Transitions discussed with patient along with other resources cancer center offers to patients and caregivers.  Patient verbalized understanding.

## 2024-04-27 ENCOUNTER — Encounter: Admitting: General Practice

## 2024-05-09 ENCOUNTER — Inpatient Hospital Stay: Admitting: *Deleted

## 2024-07-11 ENCOUNTER — Inpatient Hospital Stay

## 2024-07-25 ENCOUNTER — Inpatient Hospital Stay: Admitting: Oncology

## 2024-08-10 ENCOUNTER — Ambulatory Visit: Admitting: Radiation Oncology

## 2024-08-12 ENCOUNTER — Other Ambulatory Visit

## 2024-08-12 ENCOUNTER — Encounter

## 2024-10-20 ENCOUNTER — Ambulatory Visit: Admitting: Dermatology
